# Patient Record
Sex: Male | Born: 1965 | Race: Black or African American | Hispanic: No | Smoking: Never smoker
Health system: Southern US, Community
[De-identification: ages and names within clinical notes are randomized; demographics above are authoritative.]

## PROBLEM LIST (undated history)

## (undated) DIAGNOSIS — S069X9A Unspecified intracranial injury with loss of consciousness of unspecified duration, initial encounter: Secondary | ICD-10-CM

## (undated) DIAGNOSIS — R531 Weakness: Secondary | ICD-10-CM

## (undated) DIAGNOSIS — I10 Essential (primary) hypertension: Secondary | ICD-10-CM

## (undated) DIAGNOSIS — E785 Hyperlipidemia, unspecified: Secondary | ICD-10-CM

## (undated) DIAGNOSIS — E119 Type 2 diabetes mellitus without complications: Secondary | ICD-10-CM

## (undated) DIAGNOSIS — S069XAA Unspecified intracranial injury with loss of consciousness status unknown, initial encounter: Secondary | ICD-10-CM

## (undated) HISTORY — DX: Essential (primary) hypertension: I10

## (undated) HISTORY — PX: TRACHEOSTOMY: SUR1362

## (undated) HISTORY — DX: Hyperlipidemia, unspecified: E78.5

---

## 2011-10-12 ENCOUNTER — Encounter (HOSPITAL_COMMUNITY): Payer: Self-pay

## 2011-10-12 ENCOUNTER — Emergency Department (HOSPITAL_COMMUNITY)
Admission: EM | Admit: 2011-10-12 | Discharge: 2011-10-12 | Disposition: A | Discharge status: Home / Self Care | Payer: Self-pay | Attending: Emergency Medicine | Admitting: Emergency Medicine

## 2011-10-12 DIAGNOSIS — M549 Dorsalgia, unspecified: Secondary | ICD-10-CM | POA: Insufficient documentation

## 2011-10-12 MED ORDER — MELOXICAM 7.5 MG PO TABS: 7.5000 mg | ORAL_TABLET | Freq: Two times a day (BID) | ORAL | Status: DC

## 2011-10-12 MED ORDER — CYCLOBENZAPRINE HCL 10 MG PO TABS: 10.0000 mg | ORAL_TABLET | Freq: Three times a day (TID) | ORAL | Status: AC

## 2011-10-12 NOTE — ED Notes (Signed)
Alert, sitting up in chair.  Back pain intermittently for 1 month and "numbness of left side of my body".
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
## 2011-10-12 NOTE — Discharge Instructions (Signed)
Back Pain, Adult Low back pain is very common. About 1 in 5 people have back pain.The cause of low back pain is rarely dangerous. The pain often gets better over time.About half of people with a sudden onset of back pain feel better in just 2 weeks. About 8 in 10 people feel better by 6 weeks.  CAUSES Some common causes of back pain include:  Strain of the muscles or ligaments supporting the spine.   Wear and tear (degeneration) of the spinal discs.   Arthritis.   Direct injury to the back.  DIAGNOSIS Most of the time, the direct cause of low back pain is not known.However, back pain can be treated effectively even when the exact cause of the pain is unknown.Answering your caregiver's questions about your overall health and symptoms is one of the most accurate ways to make sure the cause of your pain is not dangerous. If your caregiver needs more information, he or she may order lab work or imaging tests (X-rays or MRIs).However, even if imaging tests show changes in your back, this usually does not require surgery. HOME CARE INSTRUCTIONS For many people, back pain returns.Since low back pain is rarely dangerous, it is often a condition that people can learn to manageon their own.   Remain active. It is stressful on the back to sit or stand in one place. Do not sit, drive, or stand in one place for more than 30 minutes at a time. Take short walks on level surfaces as soon as pain allows.Try to increase the length of time you walk each day.   Do not stay in bed.Resting more than 1 or 2 days can delay your recovery.   Do not avoid exercise or work.Your body is made to move.It is not dangerous to be active, even though your back may hurt.Your back will likely heal faster if you return to being active before your pain is gone.   Pay attention to your body when you bend and lift. Many people have less discomfortwhen lifting if they bend their knees, keep the load close to their  bodies,and avoid twisting. Often, the most comfortable positions are those that put less stress on your recovering back.   Find a comfortable position to sleep. Use a firm mattress and lie on your side with your knees slightly bent. If you lie on your back, put a pillow under your knees.   Only take over-the-counter or prescription medicines as directed by your caregiver. Over-the-counter medicines to reduce pain and inflammation are often the most helpful.Your caregiver may prescribe muscle relaxant drugs.These medicines help dull your pain so you can more quickly return to your normal activities and healthy exercise.   Put ice on the injured area.   Put ice in a plastic bag.   Place a towel between your skin and the bag.   Leave the ice on for 15 to 20 minutes, 3 to 4 times a day for the first 2 to 3 days. After that, ice and heat may be alternated to reduce pain and spasms.   Ask your caregiver about trying back exercises and gentle massage. This may be of some benefit.   Avoid feeling anxious or stressed.Stress increases muscle tension and can worsen back pain.It is important to recognize when you are anxious or stressed and learn ways to manage it.Exercise is a great option.  SEEK MEDICAL CARE IF:  You have pain that is not relieved with rest or medicine.   You have   pain that does not improve in 1 week.   You have new symptoms.   You are generally not feeling well.  SEEK IMMEDIATE MEDICAL CARE IF:   You have pain that radiates from your back into your legs.   You develop new bowel or bladder control problems.   You have unusual weakness or numbness in your arms or legs.   You develop nausea or vomiting.   You develop abdominal pain.   You feel faint.  Document Released: 07/31/2005 Document Revised: 04/12/2011 Document Reviewed: 12/19/2010 [employer] Patient Information 2012 [employer]. 
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
## 2011-10-13 NOTE — ED Provider Notes (Signed)
Medical screening examination/treatment/procedure(s) were performed by non-physician practitioner and as supervising physician I was immediately available for consultation/collaboration.  Nicoletta Dress. Colon Branch, MD 10/13/11 1704

## 2012-09-29 ENCOUNTER — Emergency Department (HOSPITAL_COMMUNITY)
Admission: EM | Admit: 2012-09-29 | Discharge: 2012-09-29 | Disposition: A | Discharge status: Home / Self Care | Payer: Self-pay | Attending: Emergency Medicine | Admitting: Emergency Medicine

## 2012-09-29 ENCOUNTER — Encounter (HOSPITAL_COMMUNITY): Payer: Self-pay | Admitting: *Deleted

## 2012-09-29 DIAGNOSIS — R350 Frequency of micturition: Secondary | ICD-10-CM | POA: Insufficient documentation

## 2012-09-29 DIAGNOSIS — R109 Unspecified abdominal pain: Secondary | ICD-10-CM | POA: Insufficient documentation

## 2012-09-29 DIAGNOSIS — R32 Unspecified urinary incontinence: Secondary | ICD-10-CM | POA: Insufficient documentation

## 2012-09-29 DIAGNOSIS — E119 Type 2 diabetes mellitus without complications: Secondary | ICD-10-CM | POA: Insufficient documentation

## 2012-09-29 DIAGNOSIS — Z87828 Personal history of other (healed) physical injury and trauma: Secondary | ICD-10-CM | POA: Insufficient documentation

## 2012-09-29 DIAGNOSIS — IMO0001 Reserved for inherently not codable concepts without codable children: Secondary | ICD-10-CM | POA: Insufficient documentation

## 2012-09-29 DIAGNOSIS — M7918 Myalgia, other site: Secondary | ICD-10-CM

## 2012-09-29 LAB — URINALYSIS, ROUTINE W REFLEX MICROSCOPIC
Bilirubin Urine: NEGATIVE
Glucose, UA: >1000 mg/dL — AB
Hgb urine dipstick: NEGATIVE
Protein, ur: NEGATIVE mg/dL

## 2012-09-29 MED ORDER — CYCLOBENZAPRINE HCL 10 MG PO TABS: 10.0000 mg | ORAL_TABLET | Freq: Three times a day (TID) | ORAL | Status: DC | PRN

## 2012-09-29 MED ORDER — CYCLOBENZAPRINE HCL 10 MG PO TABS
10.0000 mg | ORAL_TABLET | Freq: Once | ORAL | Status: AC
  Administered 2012-09-29: 10 mg via ORAL
  Filled 2012-09-29: qty 1

## 2012-09-29 MED ORDER — METFORMIN HCL 500 MG PO TABS: 500.0000 mg | ORAL_TABLET | Freq: Two times a day (BID) | ORAL | Status: DC

## 2012-09-29 MED ORDER — IBUPROFEN 800 MG PO TABS
800.0000 mg | ORAL_TABLET | Freq: Once | ORAL | Status: AC
  Administered 2012-09-29: 800 mg via ORAL
  Filled 2012-09-29: qty 1

## 2012-09-29 NOTE — ED Notes (Signed)
Pt states right flank pain began 4 days ago. Denies hematuria. States urinary incontinence at times.

## 2012-09-29 NOTE — ED Notes (Signed)
Patient's BGL 327.  Patient stated he had not eaten or drank anything this morning.  Doctor and Nurse were notified.
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
## 2012-10-01 LAB — POCT I-STAT, CHEM 8
BUN: 8 mg/dL (ref 6–23)
Creatinine, Ser: 1.1 mg/dL (ref 0.50–1.35)
Potassium: 4.1 mEq/L (ref 3.5–5.1)
Sodium: 138 mEq/L (ref 135–145)

## 2013-07-13 ENCOUNTER — Encounter (HOSPITAL_COMMUNITY): Payer: Self-pay | Admitting: Emergency Medicine

## 2013-07-13 ENCOUNTER — Emergency Department (HOSPITAL_COMMUNITY)
Admission: EM | Admit: 2013-07-13 | Discharge: 2013-07-13 | Disposition: A | Discharge status: Home / Self Care | Payer: Self-pay | Attending: Emergency Medicine | Admitting: Emergency Medicine

## 2013-07-13 ENCOUNTER — Emergency Department (HOSPITAL_COMMUNITY): Payer: Self-pay

## 2013-07-13 DIAGNOSIS — M549 Dorsalgia, unspecified: Secondary | ICD-10-CM | POA: Insufficient documentation

## 2013-07-13 DIAGNOSIS — J329 Chronic sinusitis, unspecified: Secondary | ICD-10-CM | POA: Insufficient documentation

## 2013-07-13 DIAGNOSIS — Z87820 Personal history of traumatic brain injury: Secondary | ICD-10-CM | POA: Insufficient documentation

## 2013-07-13 DIAGNOSIS — H53149 Visual discomfort, unspecified: Secondary | ICD-10-CM | POA: Insufficient documentation

## 2013-07-13 DIAGNOSIS — R51 Headache: Secondary | ICD-10-CM | POA: Insufficient documentation

## 2013-07-13 DIAGNOSIS — Z79899 Other long term (current) drug therapy: Secondary | ICD-10-CM | POA: Insufficient documentation

## 2013-07-13 DIAGNOSIS — G8929 Other chronic pain: Secondary | ICD-10-CM | POA: Insufficient documentation

## 2013-07-13 DIAGNOSIS — H538 Other visual disturbances: Secondary | ICD-10-CM | POA: Insufficient documentation

## 2013-07-13 HISTORY — DX: Unspecified intracranial injury with loss of consciousness status unknown, initial encounter: S06.9XAA

## 2013-07-13 HISTORY — DX: Unspecified intracranial injury with loss of consciousness of unspecified duration, initial encounter: S06.9X9A

## 2013-07-13 HISTORY — DX: Weakness: R53.1

## 2013-07-13 LAB — CBC WITH DIFFERENTIAL/PLATELET
Eosinophils Absolute: 0.1 10*3/uL (ref 0.0–0.7)
Eosinophils Relative: 1 % (ref 0–5)
Hemoglobin: 14.6 g/dL (ref 13.0–17.0)
Lymphocytes Relative: 32 % (ref 12–46)
Lymphs Abs: 2.4 10*3/uL (ref 0.7–4.0)
MCH: 27.4 pg (ref 26.0–34.0)
MCV: 78.6 fL (ref 78.0–100.0)
Monocytes Relative: 6 % (ref 3–12)
Neutrophils Relative %: 60 % (ref 43–77)
RBC: 5.33 MIL/uL (ref 4.22–5.81)
WBC: 7.4 10*3/uL (ref 4.0–10.5)

## 2013-07-13 LAB — COMPREHENSIVE METABOLIC PANEL
Alkaline Phosphatase: 107 U/L (ref 39–117)
BUN: 9 mg/dL (ref 6–23)
CO2: 21 mEq/L (ref 19–32)
GFR calc Af Amer: >90 mL/min (ref >90)
GFR calc non Af Amer: >90 mL/min (ref >90)
Glucose, Bld: 354 mg/dL — ABNORMAL HIGH (ref 70–99)
Potassium: 3.8 mEq/L (ref 3.5–5.1)
Total Bilirubin: 0.2 mg/dL — ABNORMAL LOW (ref 0.3–1.2)
Total Protein: 7.5 g/dL (ref 6.0–8.3)

## 2013-07-13 LAB — TROPONIN I: Troponin I: <0.3 ng/mL (ref <0.30)

## 2013-07-13 MED ORDER — HYDROCODONE-ACETAMINOPHEN 5-325 MG PO TABS: 2.0000 | ORAL_TABLET | ORAL | Status: DC | PRN

## 2013-07-13 MED ORDER — HYDROCODONE-ACETAMINOPHEN 5-325 MG PO TABS
2.0000 | ORAL_TABLET | Freq: Once | ORAL | Status: AC
  Administered 2013-07-13: 2 via ORAL
  Filled 2013-07-13: qty 2

## 2013-07-13 MED ORDER — AZITHROMYCIN 250 MG PO TABS
500.0000 mg | ORAL_TABLET | Freq: Once | ORAL | Status: AC
  Administered 2013-07-13: 500 mg via ORAL
  Filled 2013-07-13: qty 2

## 2013-07-13 MED ORDER — AZITHROMYCIN 250 MG PO TABS: 250.0000 mg | ORAL_TABLET | Freq: Every day | ORAL | Status: DC

## 2013-07-13 NOTE — ED Notes (Signed)
Pt c/o some left sided chest pain.  Dr. Judd Lien notified.  Order received.
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
## 2013-09-07 ENCOUNTER — Other Ambulatory Visit: Payer: Self-pay

## 2013-09-07 ENCOUNTER — Encounter (HOSPITAL_COMMUNITY): Payer: Self-pay | Admitting: Emergency Medicine

## 2013-09-07 ENCOUNTER — Emergency Department (HOSPITAL_COMMUNITY): Payer: Self-pay

## 2013-09-07 ENCOUNTER — Emergency Department (HOSPITAL_COMMUNITY)
Admission: EM | Admit: 2013-09-07 | Discharge: 2013-09-07 | Disposition: A | Discharge status: Home / Self Care | Payer: Self-pay | Attending: Emergency Medicine | Admitting: Emergency Medicine

## 2013-09-07 DIAGNOSIS — Z8739 Personal history of other diseases of the musculoskeletal system and connective tissue: Secondary | ICD-10-CM | POA: Insufficient documentation

## 2013-09-07 DIAGNOSIS — R079 Chest pain, unspecified: Secondary | ICD-10-CM | POA: Insufficient documentation

## 2013-09-07 DIAGNOSIS — Z87820 Personal history of traumatic brain injury: Secondary | ICD-10-CM | POA: Insufficient documentation

## 2013-09-07 DIAGNOSIS — E119 Type 2 diabetes mellitus without complications: Secondary | ICD-10-CM | POA: Insufficient documentation

## 2013-09-07 DIAGNOSIS — R51 Headache: Secondary | ICD-10-CM | POA: Insufficient documentation

## 2013-09-07 HISTORY — DX: Type 2 diabetes mellitus without complications: E11.9

## 2013-09-07 LAB — CBC WITH DIFFERENTIAL/PLATELET
Basophils Absolute: 0 10*3/uL (ref 0.0–0.1)
Basophils Relative: 0 % (ref 0–1)
EOS PCT: 1 % (ref 0–5)
Eosinophils Absolute: 0.1 10*3/uL (ref 0.0–0.7)
HEMATOCRIT: 43.1 % (ref 39.0–52.0)
Hemoglobin: 14.3 g/dL (ref 13.0–17.0)
LYMPHS ABS: 2.4 10*3/uL (ref 0.7–4.0)
LYMPHS PCT: 31 % (ref 12–46)
MCH: 26.7 pg (ref 26.0–34.0)
MCHC: 33.2 g/dL (ref 30.0–36.0)
MCV: 80.6 fL (ref 78.0–100.0)
MONO ABS: 0.7 10*3/uL (ref 0.1–1.0)
MONOS PCT: 9 % (ref 3–12)
Neutro Abs: 4.5 10*3/uL (ref 1.7–7.7)
Neutrophils Relative %: 59 % (ref 43–77)
Platelets: 280 10*3/uL (ref 150–400)
RBC: 5.35 MIL/uL (ref 4.22–5.81)
RDW: 14.1 % (ref 11.5–15.5)
WBC: 7.7 10*3/uL (ref 4.0–10.5)

## 2013-09-07 LAB — COMPREHENSIVE METABOLIC PANEL
ALK PHOS: 81 U/L (ref 39–117)
ALT: 20 U/L (ref 0–53)
AST: 12 U/L (ref 0–37)
Albumin: 3.7 g/dL (ref 3.5–5.2)
BILIRUBIN TOTAL: 0.3 mg/dL (ref 0.3–1.2)
BUN: 8 mg/dL (ref 6–23)
CALCIUM: 9.5 mg/dL (ref 8.4–10.5)
CO2: 25 meq/L (ref 19–32)
CREATININE: 0.76 mg/dL (ref 0.50–1.35)
Chloride: 101 mEq/L (ref 96–112)
GFR calc Af Amer: >90 mL/min (ref >90)
GFR calc non Af Amer: >90 mL/min (ref >90)
GLUCOSE: 128 mg/dL — AB (ref 70–99)
Potassium: 3.6 mEq/L — ABNORMAL LOW (ref 3.7–5.3)
Sodium: 139 mEq/L (ref 137–147)
Total Protein: 7.9 g/dL (ref 6.0–8.3)

## 2013-09-07 LAB — TROPONIN I
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
## 2013-09-07 NOTE — ED Notes (Signed)
Performed EKG in Triage; pulled old EKG from MUSE and handed both to Dr Estell HarpinZammit.

## 2013-09-07 NOTE — ED Notes (Signed)
Pt c/o left lower rib/upper abd pain that started a hour ago, denies any n/v/d, sob, diaphoresis, radiation,  admits to headache that started with the pain,

## 2013-09-07 NOTE — Discharge Instructions (Signed)

## 2014-06-22 LAB — HM DIABETES FOOT EXAM

## 2015-05-12 ENCOUNTER — Other Ambulatory Visit: Payer: Self-pay | Admitting: Physician Assistant

## 2015-05-12 LAB — HEMOGLOBIN A1C
Hgb A1c MFr Bld: 12.7 % — ABNORMAL HIGH (ref <5.7)
Mean Plasma Glucose: 318 mg/dL — ABNORMAL HIGH (ref <117)

## 2015-05-12 LAB — COMPREHENSIVE METABOLIC PANEL
ALBUMIN: 3.9 g/dL (ref 3.6–5.1)
ALK PHOS: 92 U/L (ref 40–115)
ALT: 19 U/L (ref 9–46)
AST: 13 U/L (ref 10–40)
BUN: 7 mg/dL (ref 7–25)
CALCIUM: 9.4 mg/dL (ref 8.6–10.3)
CO2: 27 mmol/L (ref 20–31)
Chloride: 98 mmol/L (ref 98–110)
Creat: 0.87 mg/dL (ref 0.60–1.35)
Glucose, Bld: 311 mg/dL — ABNORMAL HIGH (ref 65–99)
Potassium: 4.4 mmol/L (ref 3.5–5.3)
Sodium: 134 mmol/L — ABNORMAL LOW (ref 135–146)
Total Bilirubin: 0.5 mg/dL (ref 0.2–1.2)
Total Protein: 7 g/dL (ref 6.1–8.1)

## 2015-05-12 LAB — LIPID PANEL
Cholesterol: 237 mg/dL — ABNORMAL HIGH (ref 125–200)
HDL: 26 mg/dL — AB (ref >=40)
Total CHOL/HDL Ratio: 9.1 Ratio — ABNORMAL HIGH (ref <=5.0)
Triglycerides: 620 mg/dL — ABNORMAL HIGH (ref <150)

## 2015-05-19 ENCOUNTER — Ambulatory Visit: Payer: Self-pay | Admitting: Physician Assistant

## 2015-05-19 ENCOUNTER — Encounter: Payer: Self-pay | Admitting: Physician Assistant

## 2015-05-19 VITALS — BP 140/80 | HR 88 | Temp 97.7°F | Wt 182.2 lb

## 2015-05-19 DIAGNOSIS — E1165 Type 2 diabetes mellitus with hyperglycemia: Secondary | ICD-10-CM

## 2015-05-19 DIAGNOSIS — Z91199 Patient's noncompliance with other medical treatment and regimen due to unspecified reason: Secondary | ICD-10-CM

## 2015-05-19 DIAGNOSIS — Z9119 Patient's noncompliance with other medical treatment and regimen: Secondary | ICD-10-CM

## 2015-05-19 DIAGNOSIS — I10 Essential (primary) hypertension: Secondary | ICD-10-CM

## 2015-05-19 DIAGNOSIS — E669 Obesity, unspecified: Secondary | ICD-10-CM

## 2015-05-19 DIAGNOSIS — E785 Hyperlipidemia, unspecified: Secondary | ICD-10-CM

## 2015-05-19 DIAGNOSIS — E118 Type 2 diabetes mellitus with unspecified complications: Principal | ICD-10-CM

## 2015-05-19 NOTE — Patient Instructions (Signed)
Take all meds as directed Bring bs log to follow up appointment

## 2015-06-29 ENCOUNTER — Ambulatory Visit: Payer: Self-pay | Admitting: Physician Assistant

## 2015-07-05 ENCOUNTER — Other Ambulatory Visit: Payer: Self-pay | Admitting: Physician Assistant

## 2015-07-12 ENCOUNTER — Ambulatory Visit: Payer: Self-pay | Admitting: Physician Assistant

## 2015-08-10 ENCOUNTER — Telehealth: Payer: Self-pay | Admitting: Student

## 2015-08-10 ENCOUNTER — Other Ambulatory Visit: Payer: Self-pay | Admitting: Physician Assistant

## 2015-08-10 DIAGNOSIS — E1165 Type 2 diabetes mellitus with hyperglycemia: Secondary | ICD-10-CM

## 2015-08-10 DIAGNOSIS — E118 Type 2 diabetes mellitus with unspecified complications: Principal | ICD-10-CM

## 2015-08-10 DIAGNOSIS — Z9119 Patient's noncompliance with other medical treatment and regimen: Secondary | ICD-10-CM | POA: Insufficient documentation

## 2015-08-10 DIAGNOSIS — I10 Essential (primary) hypertension: Secondary | ICD-10-CM

## 2015-08-10 DIAGNOSIS — E785 Hyperlipidemia, unspecified: Secondary | ICD-10-CM

## 2015-08-10 DIAGNOSIS — Z91199 Patient's noncompliance with other medical treatment and regimen due to unspecified reason: Secondary | ICD-10-CM | POA: Insufficient documentation

## 2015-08-10 DIAGNOSIS — E669 Obesity, unspecified: Secondary | ICD-10-CM | POA: Insufficient documentation

## 2015-08-10 DIAGNOSIS — IMO0002 Reserved for concepts with insufficient information to code with codable children: Secondary | ICD-10-CM | POA: Insufficient documentation

## 2015-08-10 NOTE — Telephone Encounter (Signed)
Called and rescheduled pt's appt for Monday Aug 30, 2015 at 8:15. Pt was informed that he needs to get labs drawn prior to his appt. Pt understood.

## 2015-08-10 NOTE — Telephone Encounter (Signed)
-----   Message from Jacquelin HawkingShannon McElroy, [place] sent at 08/10/2015  9:04 AM EST ----- Please call pt- he needs to reschedule the appointment that he no-showed for.  His diabetes was very much uncontrolled at his last OV so he really needs to come in.  He should get labs prior to appt.

## 2015-08-10 NOTE — Progress Notes (Signed)
BP 140/80 mmHg  Pulse 88  Temp(Src) 97.7 F (36.5 C)  Wt 182 lb 3.2 oz (82.645 kg)  SpO2 97%   Subjective:    Patient ID: Ricky [name], male    DOB: [birth year], 49 y.o.   MRN: [id number]  HPI: Ricky [name] is a 49 y.o. male presenting on 05/19/2015 for Diabetes   HPI  Relevant past medical, surgical, family and social history reviewed and updated as indicated. Interim medical history since our last visit reviewed. Allergies and medications reviewed and updated.  Review of Systems  Per HPI unless specifically indicated above     Objective:    BP 140/80 mmHg  Pulse 88  Temp(Src) 97.7 F (36.5 C)  Wt 182 lb 3.2 oz (82.645 kg)  SpO2 97%  Wt Readings from Last 3 Encounters:  05/19/15 182 lb 3.2 oz (82.645 kg)  04/01/15 188 lb 12.8 oz (85.639 kg)  09/07/13 185 lb (83.915 kg)    Physical Exam  Constitutional: He is oriented to person, place, and time. He appears well-developed and well-nourished.  HENT:  Head: Normocephalic and atraumatic.  Neck: Neck supple.  Cardiovascular: Normal rate and regular rhythm.   Pulmonary/Chest: Effort normal and breath sounds normal. He has no wheezes.  Abdominal: Soft. Bowel sounds are normal. There is no tenderness.  Musculoskeletal: He exhibits no edema.  Lymphadenopathy:    He has no cervical adenopathy.  Neurological: He is alert and oriented to person, place, and time.  Skin: Skin is warm and dry.  Psychiatric: He has a normal mood and affect. His behavior is normal.  Vitals reviewed.     Results for orders placed or performed in visit on 05/12/15  Comprehensive metabolic panel  Result Value Ref Range   Sodium 134 (L) 135 - 146 mmol/L   Potassium 4.4 3.5 - 5.3 mmol/L   Chloride 98 98 - 110 mmol/L   CO2 27 20 - 31 mmol/L   Glucose, Bld 311 (H) 65 - 99 mg/dL   BUN 7 7 - 25 mg/dL   Creat 4.09 8.11 - 9.14 mg/dL   Total Bilirubin 0.5 0.2 - 1.2 mg/dL   Alkaline Phosphatase 92 40 - 115 U/L   AST 13 10 - 40 U/L   ALT 19 9 - 46 U/L   Total Protein 7.0 6.1 - 8.1 g/dL   Albumin 3.9 3.6 - 5.1 g/dL   Calcium 9.4 8.6 - 78.2 mg/dL  Lipid panel  Result Value Ref Range   Cholesterol 237 (H) 125 - 200 mg/dL   Triglycerides 956 (H) <150 mg/dL   HDL 26 (L) >=21 mg/dL   Total CHOL/HDL Ratio 9.1 (H) <=5.0 Ratio   VLDL NOT CALC <30 mg/dL   LDL Cholesterol NOT CALC <130 mg/dL  Hemoglobin H0Q  Result Value Ref Range   Hgb A1c MFr Bld 12.7 (H) <5.7 %   Mean Plasma Glucose 318 (H) <117 mg/dL      Assessment & Plan:   Encounter Diagnoses  Name Primary?  [name] Uncontrolled type 2 diabetes mellitus with complication, unspecified long term insulin use status (HCC) Yes  . Personal history of noncompliance with medical treatment, presenting hazards to health   . Essential hypertension, benign   . Hyperlipidemia   . Obesity, unspecified      Reviewed labs and his need for compliance with his medications.  Discussed long term consequences of his continuing to not take care of his diabetes and blood pressure.   Follow up plan: Return in about 5  weeks (around 06/23/2015) for dm.

## 2015-08-25 ENCOUNTER — Other Ambulatory Visit: Payer: Self-pay | Admitting: Physician Assistant

## 2015-08-26 LAB — COMPLETE METABOLIC PANEL WITH GFR
ALT: 16 U/L (ref 9–46)
AST: 10 U/L (ref 10–40)
Albumin: 4 g/dL (ref 3.6–5.1)
Alkaline Phosphatase: 83 U/L (ref 40–115)
BUN: 8 mg/dL (ref 7–25)
CHLORIDE: 98 mmol/L (ref 98–110)
CO2: 26 mmol/L (ref 20–31)
Calcium: 9.3 mg/dL (ref 8.6–10.3)
Creat: 0.94 mg/dL (ref 0.60–1.35)
GFR, Est African American: >89 mL/min (ref >=60)
GFR, Est Non African American: >89 mL/min (ref >=60)
Glucose, Bld: 342 mg/dL — ABNORMAL HIGH (ref 65–99)
Potassium: 4.7 mmol/L (ref 3.5–5.3)
SODIUM: 136 mmol/L (ref 135–146)
Total Bilirubin: 0.4 mg/dL (ref 0.2–1.2)
Total Protein: 7.1 g/dL (ref 6.1–8.1)

## 2015-08-26 LAB — LIPID PANEL
Cholesterol: 235 mg/dL — ABNORMAL HIGH (ref 125–200)
HDL: 32 mg/dL — ABNORMAL LOW (ref >=40)
TRIGLYCERIDES: 452 mg/dL — AB (ref <150)
Total CHOL/HDL Ratio: 7.3 Ratio — ABNORMAL HIGH (ref <=5.0)

## 2015-08-26 LAB — HEMOGLOBIN A1C
Hgb A1c MFr Bld: 13.1 % — ABNORMAL HIGH (ref <5.7)
Mean Plasma Glucose: 329 mg/dL — ABNORMAL HIGH (ref <117)

## 2015-08-26 LAB — MICROALBUMIN, URINE: Microalb, Ur: 7.7 mg/dL

## 2015-08-30 ENCOUNTER — Ambulatory Visit: Payer: Self-pay | Admitting: Physician Assistant

## 2015-08-30 ENCOUNTER — Encounter: Payer: Self-pay | Admitting: Physician Assistant

## 2015-08-30 VITALS — BP 146/88 | HR 87 | Temp 97.9°F | Ht 65.0 in | Wt 178.8 lb

## 2015-08-30 DIAGNOSIS — E785 Hyperlipidemia, unspecified: Secondary | ICD-10-CM

## 2015-08-30 DIAGNOSIS — Z91199 Patient's noncompliance with other medical treatment and regimen due to unspecified reason: Secondary | ICD-10-CM

## 2015-08-30 DIAGNOSIS — E118 Type 2 diabetes mellitus with unspecified complications: Principal | ICD-10-CM

## 2015-08-30 DIAGNOSIS — Z9119 Patient's noncompliance with other medical treatment and regimen: Secondary | ICD-10-CM

## 2015-08-30 DIAGNOSIS — E1165 Type 2 diabetes mellitus with hyperglycemia: Secondary | ICD-10-CM

## 2015-08-30 DIAGNOSIS — I10 Essential (primary) hypertension: Secondary | ICD-10-CM

## 2015-08-30 MED ORDER — SITAGLIPTIN PHOS-METFORMIN HCL 50-1000 MG PO TABS: 1.0000 | ORAL_TABLET | Freq: Two times a day (BID) | ORAL | Status: DC

## 2015-08-30 MED ORDER — ATORVASTATIN CALCIUM 20 MG PO TABS
20.0000 mg | ORAL_TABLET | Freq: Every day | ORAL | Status: DC
Start: 2015-08-30 — End: 2016-04-04

## 2015-08-30 MED ORDER — LISINOPRIL 20 MG PO TABS: 20.0000 mg | ORAL_TABLET | Freq: Every day | ORAL | Status: DC

## 2015-08-30 NOTE — Progress Notes (Signed)
BP 146/88 mmHg  Pulse 87  Temp(Src) 97.9 F (36.6 C)  Ht 5\' 5"  (1.651 m)  Wt 178 lb 12.8 oz (81.103 kg)  BMI 29.75 kg/m2  SpO2 98%   Subjective:    Patient ID: Ricky [name], male    DOB: Dec 13, 1965, 50 y.o.   MRN: [id number]  HPI: Ricky SparkJoseph Njoku is a 50 y.o. male presenting on 08/30/2015 for Diabetes   HPI Pt is not taking his glipizide or his simvastatin  Pt was scheduled to f/u Nov 15 but no-showed.  He then cancelled his appt on Nov 28 and is just now returning for follow-up.    He says he is feeling fine.  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   Relevant past medical, surgical, family and social history reviewed and updated as indicated. Interim medical history since our last visit reviewed. Allergies and medications reviewed and updated.   Current outpatient prescriptions:  .  insulin glargine (LANTUS) 100 UNIT/ML injection, Inject 18 Units into the skin at bedtime. , Disp: , Rfl:  .  lisinopril (PRINIVIL,ZESTRIL) 20 MG tablet, TAKE 1 Tablet BY MOUTH ONCE DAILY FOR BLOOD PRESSURE, Disp: 90 tablet, Rfl: 0 .  metFORMIN (GLUCOPHAGE) 1000 MG tablet, TAKE 1 Tablet  BY MOUTH TWICE DAILY FOR DIABETES, Disp: 180 tablet, Rfl: 0 .  Omega-3 Fatty Acids (FISH OIL PO), Take 1 tablet by mouth every 4 (four) hours. , Disp: , Rfl:    Review of Systems  Constitutional: Positive for unexpected weight change. Negative for fever, chills, diaphoresis, appetite change and fatigue.  HENT: Positive for congestion and dental problem. Negative for drooling, ear pain, facial swelling, hearing loss, mouth sores, sneezing, sore throat, trouble swallowing and voice change.   Eyes: Negative for pain, discharge, redness, itching and visual disturbance.  Respiratory: Negative for cough, choking, shortness of breath and wheezing.   Cardiovascular: Negative for chest pain, palpitations and leg swelling.  Gastrointestinal: Positive for constipation. Negative for vomiting,  abdominal pain, diarrhea and blood in stool.  Endocrine: Positive for polydipsia. Negative for cold intolerance and heat intolerance.  Genitourinary: Negative for dysuria, hematuria and decreased urine volume.  Musculoskeletal: Positive for back pain. Negative for arthralgias and gait problem.  Skin: Positive for rash.  Allergic/Immunologic: Negative for environmental allergies.  Neurological: Negative for seizures, syncope, light-headedness and headaches.  Hematological: Negative for adenopathy.  Psychiatric/Behavioral: Positive for dysphoric mood. Negative for suicidal ideas and agitation. The patient is nervous/anxious.     Per HPI unless specifically indicated above     Objective:    BP 146/88 mmHg  Pulse 87  Temp(Src) 97.9 F (36.6 C)  Ht 5\' 5"  (1.651 m)  Wt 178 lb 12.8 oz (81.103 kg)  BMI 29.75 kg/m2  SpO2 98%  Wt Readings from Last 3 Encounters:  08/30/15 178 lb 12.8 oz (81.103 kg)  05/19/15 182 lb 3.2 oz (82.645 kg)  04/01/15 188 lb 12.8 oz (85.639 kg)    Physical Exam  Constitutional: He is oriented to person, place, and time. He appears well-developed and well-nourished.  HENT:  Head: Normocephalic and atraumatic.  Neck: Neck supple.  Cardiovascular: Normal rate and regular rhythm.   Pulmonary/Chest: Effort normal and breath sounds normal. He has no wheezes.  Abdominal: Soft. Bowel sounds are normal. There is no tenderness.  Musculoskeletal: He exhibits no edema.  Lymphadenopathy:    He has no cervical adenopathy.  Neurological: He is alert and oriented to person, place, and time.  Skin: Skin is warm  and dry.  Psychiatric: He has a normal mood and affect. His behavior is normal.  Vitals reviewed.       Assessment & Plan:   Encounter Diagnoses  Name Primary?  [name] Uncontrolled type 2 diabetes mellitus with complication, unspecified long term insulin use status (HCC) Yes  . Essential hypertension, benign   . Hyperlipidemia   . Personal history of  noncompliance with medical treatment, presenting hazards to health     -reviewed labs done January 11  With pt -  All significantly uncontrolled -Do not restart glipizide.  Add [place]. -counseled pt again on the need that he be compliant with his medications and coming in for his office visits in order to get control of his diabetes and htn so as to minimize systemic damage -discussed with pt that he is past due in need for diabetic eye exam.  He will need to schedule and pay for this himself as he no-showed to free appointment that the clinic scheduled for him in the past.  Per clinic policy, we do not re-schedule no-shows for diabetic eye exams. -F/u 1 mo with bs log

## 2015-08-30 NOTE — Patient Instructions (Signed)
BRING ALL MEDICINES TO EVERY APPOINTMENT!

## 2015-09-27 ENCOUNTER — Encounter: Payer: Self-pay | Admitting: Physician Assistant

## 2015-09-27 ENCOUNTER — Ambulatory Visit: Payer: Self-pay | Admitting: Physician Assistant

## 2015-09-27 VITALS — BP 144/86 | HR 89 | Temp 98.1°F | Ht 65.0 in | Wt 183.2 lb

## 2015-09-27 DIAGNOSIS — E118 Type 2 diabetes mellitus with unspecified complications: Secondary | ICD-10-CM

## 2015-09-27 DIAGNOSIS — Z9119 Patient's noncompliance with other medical treatment and regimen: Secondary | ICD-10-CM

## 2015-09-27 DIAGNOSIS — Z91199 Patient's noncompliance with other medical treatment and regimen due to unspecified reason: Secondary | ICD-10-CM

## 2015-09-27 DIAGNOSIS — I10 Essential (primary) hypertension: Secondary | ICD-10-CM

## 2015-09-27 DIAGNOSIS — E1165 Type 2 diabetes mellitus with hyperglycemia: Secondary | ICD-10-CM

## 2015-09-27 MED ORDER — LISINOPRIL 20 MG PO TABS: 20.0000 mg | ORAL_TABLET | Freq: Every day | ORAL | Status: DC

## 2015-09-27 NOTE — Patient Instructions (Signed)
BRING YOUR BLOOD SUGAR LOG TO NEXT APPOINTMENT!

## 2015-09-27 NOTE — Progress Notes (Signed)
BP 144/86 mmHg  Pulse 89  Temp(Src) 98.1 F (36.7 C)  Ht  (1.651 m)  Wt 183 lb 3.2 oz (83.099 kg)  BMI 30.49 kg/m2  SpO2 97%   Subjective:    Patient ID: Ricky [name], male    DOB: Dec 19, 1965, 50 y.o.   MRN: [id number]  HPI: Ricky [name] is a 50 y.o. male presenting on 09/27/2015 for Diabetes   HPI  Chief Complaint  Patient presents with  . Diabetes    pt states he is doing alright    Pt is not taking his lisinopril.  He just stopped it.  Pt says he checks his bs but he lost his log sheet.   Relevant past medical, surgical, family and social history reviewed and updated as indicated. Interim medical history since our last visit reviewed. Allergies and medications reviewed and updated.   Current outpatient prescriptions:  .  atorvastatin (LIPITOR) 20 MG tablet, Take 1 tablet (20 mg total) by mouth daily., Disp: 90 tablet, Rfl: 1 .  insulin glargine (LANTUS) 100 UNIT/ML injection, Inject 18 Units into the skin at bedtime. , Disp: , Rfl:  .  Omega-3 Fatty Acids (FISH OIL PO), Take 1 tablet by mouth 4 (four) times daily. , Disp: , Rfl:  .  sitaGLIPtin-metformin (JANUMET) 50-1000 MG tablet, Take 1 tablet by mouth 2 (two) times daily with a meal., Disp: 180 tablet, Rfl: 1 .  lisinopril (PRINIVIL,ZESTRIL) 20 MG tablet, Take 1 tablet (20 mg total) by mouth daily., Disp: 90 tablet, Rfl: 1   Review of Systems  Constitutional: Negative for fever, chills, diaphoresis, appetite change, fatigue and unexpected weight change.  HENT: Negative for congestion, dental problem, drooling, ear pain, facial swelling, hearing loss, mouth sores, sneezing, sore throat, trouble swallowing and voice change.   Eyes: Negative for pain, discharge, redness, itching and visual disturbance.  Respiratory: Negative for cough, choking, shortness of breath and wheezing.   Cardiovascular: Negative for chest pain, palpitations and leg swelling.  Gastrointestinal: Negative for vomiting,  abdominal pain, diarrhea, constipation and blood in stool.  Endocrine: Negative for cold intolerance, heat intolerance and polydipsia.  Genitourinary: Negative for dysuria, hematuria and decreased urine volume.  Musculoskeletal: Negative for back pain, arthralgias and gait problem.  Skin: Negative for rash.  Allergic/Immunologic: Negative for environmental allergies.  Neurological: Negative for seizures, syncope, light-headedness and headaches.  Hematological: Negative for adenopathy.  Psychiatric/Behavioral: Negative for suicidal ideas, dysphoric mood and agitation. The patient is not nervous/anxious.     Per HPI unless specifically indicated above     Objective:    BP 144/86 mmHg  Pulse 89  Temp(Src) 98.1 F (36.7 C)  Ht  (1.651 m)  Wt 183 lb 3.2 oz (83.099 kg)  BMI 30.49 kg/m2  SpO2 97%  Wt Readings from Last 3 Encounters:  09/27/15 183 lb 3.2 oz (83.099 kg)  08/30/15 178 lb 12.8 oz (81.103 kg)  05/19/15 182 lb 3.2 oz (82.645 kg)    Physical Exam  Constitutional: He is oriented to person, place, and time. He appears well-developed and well-nourished.  HENT:  Head: Normocephalic and atraumatic.  Neck: Neck supple.  Cardiovascular: Normal rate and regular rhythm.   Pulmonary/Chest: Effort normal and breath sounds normal. He has no wheezes.  Abdominal: Soft. Bowel sounds are normal. There is no hepatosplenomegaly. There is no tenderness.  Musculoskeletal: He exhibits no edema.  Lymphadenopathy:    He has no cervical adenopathy.  Neurological: He is alert and oriented to person, place, and time.  Skin: Skin is warm and dry.  Psychiatric: He has a normal mood and affect. His behavior is normal.  Vitals reviewed.       Assessment & Plan:   Encounter Diagnoses  Name Primary?  . Personal history of noncompliance with medical treatment, presenting hazards to health Yes  . Uncontrolled type 2 diabetes mellitus with complication, unspecified long term insulin use  status (HCC)   . Essential hypertension, benign      -discussed again with pt that his continuing non-compliance is harming his health.  He again agrees to do better -he is to resume his lisinopril -he is to monitor his a.m. Fasting bs and bring log to f/u OV in one month -again discussed with pt that we cannot send him to the eye doctor b/c he no-showed to the appointment that we made for him.  He states understanding

## 2015-10-26 ENCOUNTER — Ambulatory Visit: Payer: Self-pay | Admitting: Physician Assistant

## 2015-11-01 ENCOUNTER — Encounter: Payer: Self-pay | Admitting: Physician Assistant

## 2015-11-01 ENCOUNTER — Ambulatory Visit: Payer: Self-pay | Admitting: Physician Assistant

## 2015-11-01 VITALS — BP 130/74 | HR 95 | Temp 98.1°F | Ht 65.0 in | Wt 185.0 lb

## 2015-11-01 DIAGNOSIS — E1165 Type 2 diabetes mellitus with hyperglycemia: Secondary | ICD-10-CM

## 2015-11-01 DIAGNOSIS — Z9119 Patient's noncompliance with other medical treatment and regimen: Secondary | ICD-10-CM

## 2015-11-01 DIAGNOSIS — E785 Hyperlipidemia, unspecified: Secondary | ICD-10-CM

## 2015-11-01 DIAGNOSIS — I10 Essential (primary) hypertension: Secondary | ICD-10-CM

## 2015-11-01 DIAGNOSIS — Z91199 Patient's noncompliance with other medical treatment and regimen due to unspecified reason: Secondary | ICD-10-CM

## 2015-11-01 DIAGNOSIS — E118 Type 2 diabetes mellitus with unspecified complications: Secondary | ICD-10-CM

## 2015-11-01 NOTE — Progress Notes (Signed)
BP 130/74 mmHg  Pulse 95  Temp(Src) 98.1 F (36.7 C)  Ht 5\' 5"  (1.651 m)  Wt 185 lb (83.915 kg)  BMI 30.79 kg/m2  SpO2 97%   Subjective:    Patient ID: Ricky [name], male    DOB: [birth year], 50 y.o.   MRN: [id number]  HPI: Ricky [name] is a 50 y.o. 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  Relevant past medical, surgical, family and social history reviewed and updated as indicated. Interim medical history since our last visit reviewed. Allergies and medications reviewed and updated.   Current outpatient prescriptions:  .  atorvastatin (LIPITOR) 20 MG tablet, Take 1 tablet (20 mg total) by mouth daily., Disp: 90 tablet, Rfl: 1 .  insulin glargine (LANTUS) 100 UNIT/ML injection, Inject 18 Units into the skin at bedtime. , Disp: , Rfl:  .  lisinopril (PRINIVIL,ZESTRIL) 20 MG tablet, Take 1 tablet (20 mg total) by mouth daily., Disp: 90 tablet, Rfl: 0 .  Omega-3 Fatty Acids (FISH OIL PO), Take 1 tablet by mouth 4 (four) times daily. , Disp: , Rfl:  .  sitaGLIPtin-metformin (JANUMET) 50-1000 MG tablet, Take 1 tablet by mouth 2 (two) times daily with a meal., Disp: 180 tablet, Rfl: 1   Review of Systems  Constitutional: Negative for fever, chills, diaphoresis, appetite change, fatigue and unexpected weight change.  HENT: Positive for dental problem. Negative for congestion, drooling, ear pain, facial swelling, hearing loss, mouth sores, sneezing, sore throat, trouble swallowing and voice change.   Eyes: Negative for pain, discharge, redness, itching and visual disturbance.  Respiratory: Negative for cough, choking, shortness of breath and wheezing.   Cardiovascular: Negative for chest pain, palpitations and leg swelling.  Gastrointestinal: Negative for vomiting, abdominal pain, diarrhea, constipation  and blood in stool.  Endocrine: Positive for polydipsia. Negative for cold intolerance and heat intolerance.  Genitourinary: Negative for dysuria, hematuria and decreased urine volume.  Musculoskeletal: Negative for back pain, arthralgias and gait problem.  Skin: Negative for rash.  Allergic/Immunologic: Negative for environmental allergies.  Neurological: Negative for seizures, syncope, light-headedness and headaches.  Hematological: Negative for adenopathy.  Psychiatric/Behavioral: Negative for suicidal ideas, dysphoric mood and agitation. The patient is not nervous/anxious.     Per HPI unless specifically indicated above     Objective:    BP 130/74 mmHg  Pulse 95  Temp(Src) 98.1 F (36.7 C)  Ht 5\' 5"  (1.651 m)  Wt 185 lb (83.915 kg)  BMI 30.79 kg/m2  SpO2 97%  Wt Readings from Last 3 Encounters:  11/01/15 185 lb (83.915 kg)  09/27/15 183 lb 3.2 oz (83.099 kg)  08/30/15 178 lb 12.8 oz (81.103 kg)    Physical Exam  Constitutional: He is oriented to person, place, and time. He appears well-developed and well-nourished.  HENT:  Head: Normocephalic and atraumatic.  Neck: Neck supple.  Cardiovascular: Normal rate and regular rhythm.   Pulmonary/Chest: Effort normal and breath sounds normal. He has no wheezes.  Lymphadenopathy:    He has no cervical adenopathy.  Neurological: He is alert and oriented to person, place, and time.  Skin: Skin is warm and dry.  Psychiatric: He has a normal mood and affect. His behavior is normal.  Vitals reviewed.       Assessment & Plan:   Encounter Diagnoses  Name Primary?  . Personal history of noncompliance with medical treatment, presenting hazards to health Yes  . Uncontrolled type 2 diabetes mellitus with complication, unspecified long term insulin use status (HCC)   . Essential hypertension, benign    -bp improved now that pt is back taking his lisinopril -Will type letter for pt- he can pick it up tomorrow -counseled pt AGAIN  about his lack of compliance which makes it really difficult if not impossible to improve control of his diabetes -F/u 1 mo with bs log.

## 2015-11-22 ENCOUNTER — Other Ambulatory Visit: Payer: Self-pay

## 2015-11-22 DIAGNOSIS — E785 Hyperlipidemia, unspecified: Secondary | ICD-10-CM

## 2015-11-22 DIAGNOSIS — I10 Essential (primary) hypertension: Secondary | ICD-10-CM

## 2015-11-22 DIAGNOSIS — E1165 Type 2 diabetes mellitus with hyperglycemia: Secondary | ICD-10-CM

## 2015-11-22 DIAGNOSIS — E118 Type 2 diabetes mellitus with unspecified complications: Principal | ICD-10-CM

## 2015-11-23 LAB — LIPID PANEL
CHOL/HDL RATIO: 6.1 ratio — AB (ref <=5.0)
Cholesterol: 201 mg/dL — ABNORMAL HIGH (ref 125–200)
HDL: 33 mg/dL — ABNORMAL LOW (ref >=40)
Triglycerides: 413 mg/dL — ABNORMAL HIGH (ref <150)

## 2015-11-23 LAB — COMPLETE METABOLIC PANEL WITH GFR
ALK PHOS: 71 U/L (ref 40–115)
ALT: 17 U/L (ref 9–46)
AST: 14 U/L (ref 10–35)
Albumin: 3.7 g/dL (ref 3.6–5.1)
BILIRUBIN TOTAL: 0.3 mg/dL (ref 0.2–1.2)
BUN: 8 mg/dL (ref 7–25)
CO2: 24 mmol/L (ref 20–31)
Calcium: 9.1 mg/dL (ref 8.6–10.3)
Chloride: 101 mmol/L (ref 98–110)
Creat: 0.89 mg/dL (ref 0.70–1.33)
GFR, Est African American: >89 mL/min (ref >=60)
GFR, Est Non African American: >89 mL/min (ref >=60)
GLUCOSE: 286 mg/dL — AB (ref 65–99)
Potassium: 4.3 mmol/L (ref 3.5–5.3)
SODIUM: 137 mmol/L (ref 135–146)
TOTAL PROTEIN: 7.1 g/dL (ref 6.1–8.1)

## 2015-11-23 LAB — HEMOGLOBIN A1C
Hgb A1c MFr Bld: 11.8 % — ABNORMAL HIGH (ref <5.7)
Mean Plasma Glucose: 292 mg/dL

## 2015-12-02 ENCOUNTER — Encounter: Payer: Self-pay | Admitting: Physician Assistant

## 2015-12-02 ENCOUNTER — Ambulatory Visit: Payer: Self-pay | Admitting: Physician Assistant

## 2015-12-02 VITALS — BP 136/78 | HR 101 | Temp 97.9°F | Ht 65.0 in | Wt 186.2 lb

## 2015-12-02 DIAGNOSIS — Z1211 Encounter for screening for malignant neoplasm of colon: Secondary | ICD-10-CM

## 2015-12-02 DIAGNOSIS — Z9119 Patient's noncompliance with other medical treatment and regimen: Secondary | ICD-10-CM

## 2015-12-02 DIAGNOSIS — E1165 Type 2 diabetes mellitus with hyperglycemia: Secondary | ICD-10-CM

## 2015-12-02 DIAGNOSIS — E785 Hyperlipidemia, unspecified: Secondary | ICD-10-CM

## 2015-12-02 DIAGNOSIS — E669 Obesity, unspecified: Secondary | ICD-10-CM

## 2015-12-02 DIAGNOSIS — I10 Essential (primary) hypertension: Secondary | ICD-10-CM

## 2015-12-02 DIAGNOSIS — E118 Type 2 diabetes mellitus with unspecified complications: Principal | ICD-10-CM

## 2015-12-02 DIAGNOSIS — Z91199 Patient's noncompliance with other medical treatment and regimen due to unspecified reason: Secondary | ICD-10-CM

## 2015-12-02 NOTE — Progress Notes (Signed)
BP 136/78 mmHg  Pulse 101  Temp(Src) 97.9 F (36.6 C)  Ht  (1.651 m)  Wt 186 lb 3.2 oz (84.46 kg)  BMI 30.99 kg/m2  SpO2 97%   Subjective:    Patient ID: Ricky [name], male    DOB: August 18, 1965, 50 y.o.   MRN: [id number]  HPI: Ricky [name] is a 50 y.o. male presenting on 12/02/2015 for Diabetes; Hypertension; and Hyperlipidemia   HPI  Pt here today to discuss his diabetes after discussing the importance of better control at this last OV 1 month ago. His bs log has 4 numbers on it   Relevant past medical, surgical, family and social history reviewed and updated as indicated. Interim medical history since our last visit reviewed. Allergies and medications reviewed and updated.  Current outpatient prescriptions:  .  atorvastatin (LIPITOR) 20 MG tablet, Take 1 tablet (20 mg total) by mouth daily., Disp: 90 tablet, Rfl: 1 .  insulin glargine (LANTUS) 100 UNIT/ML injection, Inject 18 Units into the skin at bedtime. , Disp: , Rfl:  .  lisinopril (PRINIVIL,ZESTRIL) 20 MG tablet, Take 1 tablet (20 mg total) by mouth daily., Disp: 90 tablet, Rfl: 0 .  Omega-3 Fatty Acids (FISH OIL PO), Take 1 tablet by mouth 4 (four) times daily. , Disp: , Rfl:  .  sitaGLIPtin-metformin (JANUMET) 50-1000 MG tablet, Take 1 tablet by mouth 2 (two) times daily with a meal., Disp: 180 tablet, Rfl: 1   Review of Systems  Constitutional: Negative for fever, chills, diaphoresis, appetite change, fatigue and unexpected weight change.  HENT: Negative for congestion, dental problem, drooling, ear pain, facial swelling, hearing loss, mouth sores, sneezing, sore throat, trouble swallowing and voice change.   Eyes: Negative for pain, discharge, redness, itching and visual disturbance.  Respiratory: Negative for cough, choking, shortness of breath and wheezing.   Cardiovascular: Negative for chest pain, palpitations and leg swelling.  Gastrointestinal: Negative for vomiting, abdominal pain, diarrhea,  constipation and blood in stool.  Endocrine: Negative for cold intolerance, heat intolerance and polydipsia.  Genitourinary: Negative for dysuria, hematuria and decreased urine volume.  Musculoskeletal: Negative for back pain, arthralgias and gait problem.  Skin: Negative for rash.  Allergic/Immunologic: Negative for environmental allergies.  Neurological: Negative for seizures, syncope, light-headedness and headaches.  Hematological: Negative for adenopathy.  Psychiatric/Behavioral: Negative for suicidal ideas, dysphoric mood and agitation. The patient is not nervous/anxious.     Per HPI unless specifically indicated above     Objective:    BP 136/78 mmHg  Pulse 101  Temp(Src) 97.9 F (36.6 C)  Ht  (1.651 m)  Wt 186 lb 3.2 oz (84.46 kg)  BMI 30.99 kg/m2  SpO2 97%  Wt Readings from Last 3 Encounters:  12/02/15 186 lb 3.2 oz (84.46 kg)  11/01/15 185 lb (83.915 kg)  09/27/15 183 lb 3.2 oz (83.099 kg)    Physical Exam  Constitutional: He is oriented to person, place, and time. He appears well-developed and well-nourished.  HENT:  Head: Normocephalic and atraumatic.  Neck: Neck supple.  Cardiovascular: Normal rate and regular rhythm.   Pulmonary/Chest: Effort normal and breath sounds normal. He has no wheezes.  Abdominal: Soft. Bowel sounds are normal. There is no hepatosplenomegaly. There is no tenderness.  Musculoskeletal: He exhibits no edema.  Lymphadenopathy:    He has no cervical adenopathy.  Neurological: He is alert and oriented to person, place, and time.  Skin: Skin is warm and dry.  Psychiatric: He has a normal mood and affect.  His behavior is normal.  Vitals reviewed.   Results for orders placed or performed in visit on 11/22/15  COMPLETE METABOLIC PANEL WITH GFR  Result Value Ref Range   Sodium 137 135 - 146 mmol/L   Potassium 4.3 3.5 - 5.3 mmol/L   Chloride 101 98 - 110 mmol/L   CO2 24 20 - 31 mmol/L   Glucose, Bld 286 (H) 65 - 99 mg/dL   BUN 8 7 -  25 mg/dL   Creat 1.610.89 0.960.70 - 0.451.33 mg/dL   Total Bilirubin 0.3 0.2 - 1.2 mg/dL   Alkaline Phosphatase 71 40 - 115 U/L   AST 14 10 - 35 U/L   ALT 17 9 - 46 U/L   Total Protein 7.1 6.1 - 8.1 g/dL   Albumin 3.7 3.6 - 5.1 g/dL   Calcium 9.1 8.6 - 40.910.3 mg/dL   GFR, Est African American >89 >=60 mL/min   GFR, Est Non African American >89 >=60 mL/min  HgB A1c  Result Value Ref Range   Hgb A1c MFr Bld 11.8 (H) <5.7 %   Mean Plasma Glucose 292 mg/dL  Lipid Profile  Result Value Ref Range   Cholesterol 201 (H) 125 - 200 mg/dL   Triglycerides 811413 (H) <150 mg/dL   HDL 33 (L) >=91>=40 mg/dL   Total CHOL/HDL Ratio 6.1 (H) <=5.0 Ratio   VLDL NOT CALC <30 mg/dL   LDL Cholesterol NOT CALC <130 mg/dL      Assessment & Plan:   Encounter Diagnoses  Name Primary?  [name]. Uncontrolled type 2 diabetes mellitus with complication, unspecified long term insulin use status (HCC) Yes  . Personal history of noncompliance with medical treatment, presenting hazards to health   . Hyperlipidemia   . Essential hypertension, benign   . Obesity, unspecified   . Special screening for malignant neoplasms, colon     -Reviewed labs with pt -discussed again with pt at some length that the care that he puts into his health/medical regimen or lack thereof will have significant impact on his long-term health and prognosis.  Discussed with pt that his life will be significantly shortened if he doesn't start taking better care with his diabetes and taking an active role in getting his a1c down and his lipids down.  Pt says he understands, he just doesn't know why he doesn't do it -Increase lantus to 20 units -Gave iFOBT -f/u 1 month with bs log

## 2015-12-08 LAB — IFOBT (OCCULT BLOOD): IMMUNOLOGICAL FECAL OCCULT BLOOD TEST: NEGATIVE

## 2016-03-02 ENCOUNTER — Encounter: Payer: Self-pay | Admitting: Physician Assistant

## 2016-03-02 ENCOUNTER — Ambulatory Visit: Payer: Self-pay | Admitting: Physician Assistant

## 2016-03-02 VITALS — BP 148/90 | HR 94 | Temp 97.9°F | Ht 65.0 in | Wt 181.0 lb

## 2016-03-02 DIAGNOSIS — Z125 Encounter for screening for malignant neoplasm of prostate: Secondary | ICD-10-CM

## 2016-03-02 DIAGNOSIS — N529 Male erectile dysfunction, unspecified: Secondary | ICD-10-CM

## 2016-03-02 DIAGNOSIS — E1165 Type 2 diabetes mellitus with hyperglycemia: Secondary | ICD-10-CM

## 2016-03-02 DIAGNOSIS — E785 Hyperlipidemia, unspecified: Secondary | ICD-10-CM

## 2016-03-02 DIAGNOSIS — E118 Type 2 diabetes mellitus with unspecified complications: Principal | ICD-10-CM

## 2016-03-02 DIAGNOSIS — I10 Essential (primary) hypertension: Secondary | ICD-10-CM

## 2016-03-02 MED ORDER — LISINOPRIL-HYDROCHLOROTHIAZIDE 20-12.5 MG PO TABS: 1.0000 | ORAL_TABLET | Freq: Every day | ORAL | Status: DC

## 2016-03-02 NOTE — Patient Instructions (Signed)
Diabetes Mellitus and Food It is important for you to manage your blood sugar (glucose) level. Your blood glucose level can be greatly affected by what you eat. Eating healthier foods in the appropriate amounts throughout the day at about the same time each day will help you control your blood glucose level. It can also help slow or prevent worsening of your diabetes mellitus. Healthy eating may even help you improve the level of your blood pressure and reach or maintain a healthy weight.  General recommendations for healthful eating and cooking habits include:  Eating meals and snacks regularly. Avoid going long periods of time without eating to lose weight.  Eating a diet that consists mainly of plant-based foods, such as fruits, vegetables, nuts, legumes, and whole grains.  Using low-heat cooking methods, such as baking, instead of high-heat cooking methods, such as deep frying. Work with your dietitian to make sure you understand how to use the Nutrition Facts information on food labels. HOW CAN FOOD AFFECT ME? Carbohydrates Carbohydrates affect your blood glucose level more than any other type of food. Your dietitian will help you determine how many carbohydrates to eat at each meal and teach you how to count carbohydrates. Counting carbohydrates is important to keep your blood glucose at a healthy level, especially if you are using insulin or taking certain medicines for diabetes mellitus. Alcohol Alcohol can cause sudden decreases in blood glucose (hypoglycemia), especially if you use insulin or take certain medicines for diabetes mellitus. Hypoglycemia can be a life-threatening condition. Symptoms of hypoglycemia (sleepiness, dizziness, and disorientation) are similar to symptoms of having too much alcohol.  If your health care provider has given you approval to drink alcohol, do so in moderation and use the following guidelines:  Women should not have more than one drink per day, and men  should not have more than two drinks per day. One drink is equal to:  12 oz of beer.  5 oz of wine.  1 oz of hard liquor.  Do not drink on an empty stomach.  Keep yourself hydrated. Have water, diet soda, or unsweetened iced tea.  Regular soda, juice, and other mixers might contain a lot of carbohydrates and should be counted. WHAT FOODS ARE NOT RECOMMENDED? As you make food choices, it is important to remember that all foods are not the same. Some foods have fewer nutrients per serving than other foods, even though they might have the same number of calories or carbohydrates. It is difficult to get your body what it needs when you eat foods with fewer nutrients. Examples of foods that you should avoid that are high in calories and carbohydrates but low in nutrients include:  Trans fats (most processed foods list trans fats on the Nutrition Facts label).  Regular soda.  Juice.  Candy.  Sweets, such as cake, pie, doughnuts, and cookies.  Fried foods. WHAT FOODS CAN I EAT? Eat nutrient-rich foods, which will nourish your body and keep you healthy. The food you should eat also will depend on several factors, including:  The calories you need.  The medicines you take.  Your weight.  Your blood glucose level.  Your blood pressure level.  Your cholesterol level. You should eat a variety of foods, including:  Protein.  Lean cuts of meat.  Proteins low in saturated fats, such as fish, egg whites, and beans. Avoid processed meats.  Fruits and vegetables.  Fruits and vegetables that may help control blood glucose levels, such as apples, mangoes, and   yams.  Dairy products.  Choose fat-free or low-fat dairy products, such as milk, yogurt, and cheese.  Grains, bread, pasta, and rice.  Choose whole grain products, such as multigrain bread, whole oats, and brown rice. These foods may help control blood pressure.  Fats.  Foods containing healthful fats, such as nuts,  avocado, olive oil, canola oil, and fish. DOES EVERYONE WITH DIABETES MELLITUS HAVE THE SAME MEAL PLAN? Because every person with diabetes mellitus is different, there is not one meal plan that works for everyone. It is very important that you meet with a dietitian who will help you create a meal plan that is just right for you.   This information is not intended to replace advice given to you by your health care provider. Make sure you discuss any questions you have with your health care provider.   Document Released: 04/27/2005 Document Revised: 08/21/2014 Document Reviewed: 06/27/2013 Elsevier Interactive Patient Education 2016 [employer]  

## 2016-03-02 NOTE — Progress Notes (Signed)
BP 148/90 mmHg  Pulse 94  Temp(Src) 97.9 F (36.6 C)  Ht 5\' 5"  (1.651 m)  Wt 181 lb (82.101 kg)  BMI 30.12 kg/m2  SpO2 96%   Subjective:    Patient ID: Ricky [name], male    DOB: Oct 22, 1965, 50 y.o.   MRN: [id number]  HPI: Ricky [name] is a 50 y.o. male presenting on 03/02/2016 for Diabetes   HPI  Pt has his new girlfriend with him today  Pt states ED 2-3 years   Relevant past medical, surgical, family and social history reviewed and updated as indicated. Interim medical history since our last visit reviewed. Allergies and medications reviewed and updated.   Current outpatient prescriptions:  .  atorvastatin (LIPITOR) 20 MG tablet, Take 1 tablet (20 mg total) by mouth daily., Disp: 90 tablet, Rfl: 1 .  insulin glargine (LANTUS) 100 UNIT/ML injection, Inject 18 Units into the skin at bedtime. , Disp: , Rfl:  .  lisinopril (PRINIVIL,ZESTRIL) 20 MG tablet, Take 1 tablet (20 mg total) by mouth daily., Disp: 90 tablet, Rfl: 0 .  Omega-3 Fatty Acids (FISH OIL PO), Take 1 tablet by mouth 4 (four) times daily. , Disp: , Rfl:  .  sitaGLIPtin-metformin (JANUMET) 50-1000 MG tablet, Take 1 tablet by mouth 2 (two) times daily with a meal., Disp: 180 tablet, Rfl: 1   Review of Systems  Constitutional: Negative for fever, chills, diaphoresis, appetite change, fatigue and unexpected weight change.  HENT: Negative for congestion, dental problem, drooling, ear pain, facial swelling, hearing loss, mouth sores, sneezing, sore throat, trouble swallowing and voice change.   Eyes: Negative for pain, discharge, redness, itching and visual disturbance.  Respiratory: Negative for cough, choking, shortness of breath and wheezing.   Cardiovascular: Negative for chest pain, palpitations and leg swelling.  Gastrointestinal: Negative for vomiting, abdominal pain, diarrhea, constipation and blood in stool.  Endocrine: Negative for cold intolerance, heat intolerance and polydipsia.   Genitourinary: Negative for dysuria, hematuria and decreased urine volume.  Musculoskeletal: Negative for back pain, arthralgias and gait problem.  Skin: Negative for rash.  Allergic/Immunologic: Negative for environmental allergies.  Neurological: Positive for headaches. Negative for seizures, syncope and light-headedness.  Hematological: Negative for adenopathy.  Psychiatric/Behavioral: Positive for dysphoric mood. Negative for suicidal ideas and agitation. The patient is not nervous/anxious.     Per HPI unless specifically indicated above     Objective:    BP 148/90 mmHg  Pulse 94  Temp(Src) 97.9 F (36.6 C)  Ht 5\' 5"  (1.651 m)  Wt 181 lb (82.101 kg)  BMI 30.12 kg/m2  SpO2 96%  Wt Readings from Last 3 Encounters:  03/02/16 181 lb (82.101 kg)  12/02/15 186 lb 3.2 oz (84.46 kg)  11/01/15 185 lb (83.915 kg)    Physical Exam  Constitutional: He is oriented to person, place, and time. He appears well-developed and well-nourished.  HENT:  Head: Normocephalic and atraumatic.  Neck: Neck supple.  Cardiovascular: Normal rate and regular rhythm.   Pulmonary/Chest: Effort normal and breath sounds normal. He has no wheezes.  Abdominal: Soft. Bowel sounds are normal. There is no hepatosplenomegaly. There is no tenderness.  Musculoskeletal: He exhibits no edema.  Lymphadenopathy:    He has no cervical adenopathy.  Neurological: He is alert and oriented to person, place, and time.  Skin: Skin is warm and dry.  Psychiatric: He has a normal mood and affect. His behavior is normal.  Vitals reviewed.       Assessment & Plan:   Encounter  Diagnoses  Name Primary?  [name] Uncontrolled type 2 diabetes mellitus with complication, unspecified long term insulin use status (HCC) Yes  . Essential hypertension, benign   . Hyperlipidemia   . Erectile dysfunction, unspecified erectile dysfunction type   . Screening for prostate cancer      -Change to lisinopril/hctz for better bp control.  rx sent to [facility] -Pt to get fasting labs drawn tomorrow morning -counseled pt at length about the need to control his diabetes if he wants to help his ED.  Discussed avoidance of sugary drinks like juice and gatorade.  Pt is given more reading information on diabetic diet.  He is also given information on reading labels.    He will monitor his am fbs and bring log sheet to f/u OV in one month

## 2016-03-08 LAB — COMPLETE METABOLIC PANEL WITH GFR
ALBUMIN: 3.8 g/dL (ref 3.6–5.1)
ALK PHOS: 64 U/L (ref 40–115)
ALT: 11 U/L (ref 9–46)
AST: 10 U/L (ref 10–35)
BUN: 11 mg/dL (ref 7–25)
CHLORIDE: 102 mmol/L (ref 98–110)
CO2: 27 mmol/L (ref 20–31)
Calcium: 9.1 mg/dL (ref 8.6–10.3)
Creat: 1.14 mg/dL (ref 0.70–1.33)
GFR, EST NON AFRICAN AMERICAN: 75 mL/min (ref >=60)
GFR, Est African American: 86 mL/min (ref >=60)
GLUCOSE: 259 mg/dL — AB (ref 65–99)
POTASSIUM: 4.7 mmol/L (ref 3.5–5.3)
SODIUM: 138 mmol/L (ref 135–146)
Total Bilirubin: 0.5 mg/dL (ref 0.2–1.2)
Total Protein: 6.9 g/dL (ref 6.1–8.1)

## 2016-03-08 LAB — LIPID PANEL
CHOL/HDL RATIO: 5.3 ratio — AB (ref <=5.0)
Cholesterol: 175 mg/dL (ref 125–200)
HDL: 33 mg/dL — AB (ref >=40)
LDL Cholesterol: 92 mg/dL (ref <130)
Triglycerides: 252 mg/dL — ABNORMAL HIGH (ref <150)
VLDL: 50 mg/dL — AB (ref <30)

## 2016-03-08 LAB — HEMOGLOBIN A1C
HEMOGLOBIN A1C: 11.8 % — AB (ref <5.7)
MEAN PLASMA GLUCOSE: 292 mg/dL

## 2016-03-08 LAB — PSA: PSA: 0.59 ng/mL (ref <=4.00)

## 2016-04-04 ENCOUNTER — Ambulatory Visit: Payer: Self-pay | Admitting: Physician Assistant

## 2016-04-04 ENCOUNTER — Encounter: Payer: Self-pay | Admitting: Physician Assistant

## 2016-04-04 VITALS — BP 126/88 | HR 102 | Temp 97.9°F | Ht 65.0 in | Wt 181.0 lb

## 2016-04-04 DIAGNOSIS — E669 Obesity, unspecified: Secondary | ICD-10-CM

## 2016-04-04 DIAGNOSIS — E1165 Type 2 diabetes mellitus with hyperglycemia: Secondary | ICD-10-CM

## 2016-04-04 DIAGNOSIS — E118 Type 2 diabetes mellitus with unspecified complications: Principal | ICD-10-CM

## 2016-04-04 DIAGNOSIS — I10 Essential (primary) hypertension: Secondary | ICD-10-CM

## 2016-04-04 DIAGNOSIS — E785 Hyperlipidemia, unspecified: Secondary | ICD-10-CM

## 2016-04-04 MED ORDER — ATORVASTATIN CALCIUM 20 MG PO TABS: 20.0000 mg | ORAL_TABLET | Freq: Every day | ORAL | 1 refills | Status: DC

## 2016-04-04 MED ORDER — SITAGLIPTIN PHOS-METFORMIN HCL 50-1000 MG PO TABS: 1.0000 | ORAL_TABLET | Freq: Two times a day (BID) | ORAL | 1 refills | Status: DC

## 2016-04-04 NOTE — Progress Notes (Signed)
BP 126/88 (BP Location: Left Arm, Patient Position: Sitting, Cuff Size: Normal)   Pulse (!) 102   Temp 97.9 F (36.6 C) (Other (Comment))   Ht 5\' 5"  (1.651 m)   Wt 181 lb (82.1 kg)   SpO2 97%   BMI 30.12 kg/m    Subjective:    Patient ID: Ricky [name], male    DOB: 09-Apr-1966, 50 y.o.   MRN: [id number]  HPI: Ricky [name] is a 50 y.o. male presenting on 04/04/2016 for Hypertension and Diabetes   HPI   Pt not taking his janumet- he ran out a long time ago and is taking old metformin.  He didn't stop his lisinopril when he started the lisinopril/hctz- he is taking both  Pt says he is feeling fine today.  Relevant past medical, surgical, family and social history reviewed and updated as indicated. Interim medical history since our last visit reviewed. Allergies and medications reviewed and updated.   Current Outpatient Prescriptions:  .  insulin glargine (LANTUS) 100 UNIT/ML injection, Inject 20 Units into the skin at bedtime. , Disp: , Rfl:  .  lisinopril (PRINIVIL,ZESTRIL) 20 MG tablet, Take 1 tablet (20 mg total) by mouth daily., Disp: 90 tablet, Rfl: 0 .  lisinopril-hydrochlorothiazide (ZESTORETIC) 20-12.5 MG tablet, Take 1 tablet by mouth daily., Disp: 90 tablet, Rfl: 1 .  metFORMIN (GLUCOPHAGE) 1000 MG tablet, Take 1,000 mg by mouth 2 (two) times daily with a meal., Disp: , Rfl:  .  Omega-3 Fatty Acids (FISH OIL PO), Take 1 tablet by mouth 4 (four) times daily. , Disp: , Rfl:  .  atorvastatin (LIPITOR) 20 MG tablet, Take 1 tablet (20 mg total) by mouth daily. (Patient not taking: Reported on 04/04/2016), Disp: 90 tablet, Rfl: 1 .  sitaGLIPtin-metformin (JANUMET) 50-1000 MG tablet, Take 1 tablet by mouth 2 (two) times daily with a meal. (Patient not taking: Reported on 04/04/2016), Disp: 180 tablet, Rfl: 1   Review of Systems  Constitutional: Negative for appetite change, chills, diaphoresis, fatigue, fever and unexpected weight change.  HENT: Negative for  congestion, drooling, ear pain, facial swelling, hearing loss, mouth sores, sneezing, sore throat, trouble swallowing and voice change.   Eyes: Positive for itching. Negative for pain, discharge, redness and visual disturbance.  Respiratory: Negative for cough, choking, shortness of breath and wheezing.   Cardiovascular: Negative for chest pain, palpitations and leg swelling.  Gastrointestinal: Negative for abdominal pain, blood in stool, constipation, diarrhea and vomiting.  Endocrine: Negative for cold intolerance, heat intolerance and polydipsia.  Genitourinary: Negative for decreased urine volume, dysuria and hematuria.  Musculoskeletal: Negative for arthralgias, back pain and gait problem.  Skin: Negative for rash.  Allergic/Immunologic: Negative for environmental allergies.  Neurological: Negative for seizures, syncope, light-headedness and headaches.  Hematological: Negative for adenopathy.  Psychiatric/Behavioral: Positive for dysphoric mood. Negative for agitation and suicidal ideas. The patient is not nervous/anxious.     Per HPI unless specifically indicated above     Objective:    BP 126/88 (BP Location: Left Arm, Patient Position: Sitting, Cuff Size: Normal)   Pulse (!) 102   Temp 97.9 F (36.6 C) (Other (Comment))   Ht 5\' 5"  (1.651 m)   Wt 181 lb (82.1 kg)   SpO2 97%   BMI 30.12 kg/m   Wt Readings from Last 3 Encounters:  04/04/16 181 lb (82.1 kg)  03/02/16 181 lb (82.1 kg)  12/02/15 186 lb 3.2 oz (84.5 kg)    Physical Exam  Constitutional: He is oriented to person,  place, and time. He appears well-developed and well-nourished.  HENT:  Head: Normocephalic and atraumatic.  Neck: Neck supple.  Cardiovascular: Normal rate and regular rhythm.   Pulmonary/Chest: Effort normal and breath sounds normal. He has no wheezes.  Abdominal: Soft. Bowel sounds are normal. There is no hepatosplenomegaly. There is no tenderness.  Musculoskeletal: He exhibits no edema.   Lymphadenopathy:    He has no cervical adenopathy.  Neurological: He is alert and oriented to person, place, and time.  Skin: Skin is warm and dry.  Psychiatric: He has a normal mood and affect. His behavior is normal.  Vitals reviewed.   Results for orders placed or performed in visit on 03/02/16  HgB A1c  Result Value Ref Range   Hgb A1c MFr Bld 11.8 (H) <5.7 %   Mean Plasma Glucose 292 mg/dL  COMPLETE METABOLIC PANEL WITH GFR  Result Value Ref Range   Sodium 138 135 - 146 mmol/L   Potassium 4.7 3.5 - 5.3 mmol/L   Chloride 102 98 - 110 mmol/L   CO2 27 20 - 31 mmol/L   Glucose, Bld 259 (H) 65 - 99 mg/dL   BUN 11 7 - 25 mg/dL   Creat 9.621.14 9.520.70 - 8.411.33 mg/dL   Total Bilirubin 0.5 0.2 - 1.2 mg/dL   Alkaline Phosphatase 64 40 - 115 U/L   AST 10 10 - 35 U/L   ALT 11 9 - 46 U/L   Total Protein 6.9 6.1 - 8.1 g/dL   Albumin 3.8 3.6 - 5.1 g/dL   Calcium 9.1 8.6 - 32.410.3 mg/dL   GFR, Est African American 86 >=60 mL/min   GFR, Est Non African American 75 >=60 mL/min  Lipid Profile  Result Value Ref Range   Cholesterol 175 125 - 200 mg/dL   Triglycerides 401252 (H) <150 mg/dL   HDL 33 (L) >=02>=40 mg/dL   Total CHOL/HDL Ratio 5.3 (H) <=5.0 Ratio   VLDL 50 (H) <30 mg/dL   LDL Cholesterol 92 <725<130 mg/dL  PSA  Result Value Ref Range   PSA 0.59 <=4.00 ng/mL      Assessment & Plan:   Encounter Diagnoses  Name Primary?  [name]. Uncontrolled type 2 diabetes mellitus with complication, unspecified long term insulin use status (HCC) Yes  . Essential hypertension, benign   . Hyperlipidemia   . Obesity, unspecified     -reviewed labs with pt.  a1c still terribly high -Gave pt sample janumet and sent rx to [facility] -rx lipitor sent to [facility]  -Pt counseled to utilize the list of his medications on his AVS to confirm that he is taking what he is supposed to. He is to bring all medicaitions to every appointment (except his insulin) -F/u 1 month to make sure pt taking proper medications. Will  likely increase his insulin at that time.  RTO sooner prn

## 2016-05-04 ENCOUNTER — Encounter: Payer: Self-pay | Admitting: Physician Assistant

## 2016-05-04 ENCOUNTER — Ambulatory Visit: Payer: Self-pay | Admitting: Physician Assistant

## 2016-05-04 VITALS — BP 148/86 | HR 93 | Temp 98.2°F | Ht 65.0 in | Wt 185.8 lb

## 2016-05-04 DIAGNOSIS — E118 Type 2 diabetes mellitus with unspecified complications: Principal | ICD-10-CM

## 2016-05-04 DIAGNOSIS — I10 Essential (primary) hypertension: Secondary | ICD-10-CM

## 2016-05-04 DIAGNOSIS — E785 Hyperlipidemia, unspecified: Secondary | ICD-10-CM

## 2016-05-04 DIAGNOSIS — E1165 Type 2 diabetes mellitus with hyperglycemia: Secondary | ICD-10-CM

## 2016-05-04 DIAGNOSIS — E669 Obesity, unspecified: Secondary | ICD-10-CM

## 2016-05-04 NOTE — Patient Instructions (Signed)
Increase insulin to 22 units each night Bring blood sugar log to next appointment

## 2016-05-04 NOTE — Progress Notes (Signed)
BP (!) 148/86 (BP Location: Left Arm, Patient Position: Sitting, Cuff Size: Normal)   Pulse 93   Temp 98.2 F (36.8 C) (Other (Comment))   Ht 5\' 5"  (1.651 m)   Wt 185 lb 12.8 oz (84.3 kg)   SpO2 97%   BMI 30.92 kg/m    Subjective:    Patient ID: Ricky [name], male    DOB: Jul 22, 1966, 50 y.o.   MRN: [id number]  HPI: Ricky [name] is a 50 y.o. male presenting on 05/04/2016 for Diabetes and Hypertension   HPI   Pt did not bring his bs log with him today.  He says it has been running "140-something".   His lowest was "120- something".  He has not had fbs > 200.  Relevant past medical, surgical, family and social history reviewed and updated as indicated. Interim medical history since our last visit reviewed. Allergies and medications reviewed and updated.   Current Outpatient Prescriptions:  .  insulin glargine (LANTUS) 100 UNIT/ML injection, Inject 20 Units into the skin at bedtime. , Disp: , Rfl:  .  lisinopril-hydrochlorothiazide (ZESTORETIC) 20-12.5 MG tablet, Take 1 tablet by mouth daily., Disp: 90 tablet, Rfl: 1 .  Omega-3 Fatty Acids (FISH OIL PO), Take 1 tablet by mouth 4 (four) times daily. , Disp: , Rfl:  .  sitaGLIPtin-metformin (JANUMET) 50-1000 MG tablet, Take 1 tablet by mouth 2 (two) times daily with a meal., Disp: 180 tablet, Rfl: 1 .  atorvastatin (LIPITOR) 20 MG tablet, Take 1 tablet (20 mg total) by mouth daily. (Patient not taking: Reported on 05/04/2016), Disp: 90 tablet, Rfl: 1   Review of Systems  Constitutional: Negative for appetite change, chills, diaphoresis, fatigue, fever and unexpected weight change.  HENT: Negative for congestion, drooling, ear pain, facial swelling, hearing loss, mouth sores, sneezing, sore throat, trouble swallowing and voice change.   Eyes: Negative for pain, discharge, redness, itching and visual disturbance.  Respiratory: Negative for cough, choking, shortness of breath and wheezing.   Cardiovascular: Negative for chest  pain, palpitations and leg swelling.  Gastrointestinal: Negative for abdominal pain, blood in stool, constipation, diarrhea and vomiting.  Endocrine: Negative for cold intolerance, heat intolerance and polydipsia.  Genitourinary: Negative for decreased urine volume, dysuria and hematuria.  Musculoskeletal: Negative for arthralgias, back pain and gait problem.  Skin: Negative for rash.  Allergic/Immunologic: Negative for environmental allergies.  Neurological: Negative for seizures, syncope, light-headedness and headaches.  Hematological: Negative for adenopathy.  Psychiatric/Behavioral: Negative for agitation, dysphoric mood and suicidal ideas. The patient is not nervous/anxious.     Per HPI unless specifically indicated above     Objective:    BP (!) 148/86 (BP Location: Left Arm, Patient Position: Sitting, Cuff Size: Normal)   Pulse 93   Temp 98.2 F (36.8 C) (Other (Comment))   Ht 5\' 5"  (1.651 m)   Wt 185 lb 12.8 oz (84.3 kg)   SpO2 97%   BMI 30.92 kg/m   Wt Readings from Last 3 Encounters:  05/04/16 185 lb 12.8 oz (84.3 kg)  04/04/16 181 lb (82.1 kg)  03/02/16 181 lb (82.1 kg)    Physical Exam  Constitutional: He is oriented to person, place, and time. He appears well-developed and well-nourished.  HENT:  Head: Normocephalic and atraumatic.  Neck: Neck supple.  Cardiovascular: Normal rate and regular rhythm.   Pulmonary/Chest: Effort normal and breath sounds normal. He has no wheezes.  Abdominal: Soft. Bowel sounds are normal. There is no hepatosplenomegaly. There is no tenderness.  Musculoskeletal: He  exhibits no edema.  Lymphadenopathy:    He has no cervical adenopathy.  Neurological: He is alert and oriented to person, place, and time.  Skin: Skin is warm and dry.  Psychiatric: He has a normal mood and affect. His behavior is normal.  Vitals reviewed.       Assessment & Plan:    Encounter Diagnoses  Name Primary?  [name] Uncontrolled type 2 diabetes mellitus  with complication, unspecified long term insulin use status (HCC) Yes  . Essential hypertension, benign   . Hyperlipidemia   . Obesity, unspecified     -Increase insulin to 22 units each night -continue other medications -call office for fbs < 70 or > 300 -no changes to bp medication. Has been good prior to today. Pt thinks due to him rushing to get to appointment from work -Bring blood sugar log to next appointment in one month

## 2016-05-15 ENCOUNTER — Ambulatory Visit: Payer: Self-pay

## 2016-06-08 ENCOUNTER — Ambulatory Visit: Payer: Self-pay | Admitting: Physician Assistant

## 2016-06-08 ENCOUNTER — Encounter: Payer: Self-pay | Admitting: Physician Assistant

## 2016-06-08 VITALS — BP 148/88 | HR 82 | Wt 178.4 lb

## 2016-06-08 DIAGNOSIS — E118 Type 2 diabetes mellitus with unspecified complications: Principal | ICD-10-CM

## 2016-06-08 DIAGNOSIS — I10 Essential (primary) hypertension: Secondary | ICD-10-CM

## 2016-06-08 DIAGNOSIS — E785 Hyperlipidemia, unspecified: Secondary | ICD-10-CM

## 2016-06-08 DIAGNOSIS — E1165 Type 2 diabetes mellitus with hyperglycemia: Secondary | ICD-10-CM

## 2016-06-08 LAB — COMPREHENSIVE METABOLIC PANEL
ALK PHOS: 87 U/L (ref 40–115)
ALT: 19 U/L (ref 9–46)
AST: 13 U/L (ref 10–35)
Albumin: 4 g/dL (ref 3.6–5.1)
BILIRUBIN TOTAL: 0.5 mg/dL (ref 0.2–1.2)
BUN: 7 mg/dL (ref 7–25)
CALCIUM: 9.7 mg/dL (ref 8.6–10.3)
CO2: 27 mmol/L (ref 20–31)
Chloride: 100 mmol/L (ref 98–110)
Creat: 1 mg/dL (ref 0.70–1.33)
GLUCOSE: 304 mg/dL — AB (ref 65–99)
Potassium: 4.9 mmol/L (ref 3.5–5.3)
Sodium: 136 mmol/L (ref 135–146)
Total Protein: 7.6 g/dL (ref 6.1–8.1)

## 2016-06-08 LAB — LIPID PANEL
Cholesterol: 235 mg/dL — ABNORMAL HIGH (ref 125–200)
HDL: 32 mg/dL — ABNORMAL LOW (ref >=40)
LDL Cholesterol: 144 mg/dL — ABNORMAL HIGH (ref <130)
Total CHOL/HDL Ratio: 7.3 Ratio — ABNORMAL HIGH (ref <=5.0)
Triglycerides: 296 mg/dL — ABNORMAL HIGH (ref <150)
VLDL: 59 mg/dL — ABNORMAL HIGH (ref <30)

## 2016-06-08 LAB — HEMOGLOBIN A1C
HEMOGLOBIN A1C: 13.2 % — AB (ref <5.7)
Mean Plasma Glucose: 332 mg/dL

## 2016-06-08 MED ORDER — ATORVASTATIN CALCIUM 20 MG PO TABS: 20.0000 mg | ORAL_TABLET | Freq: Every day | ORAL | 0 refills | Status: DC

## 2016-06-08 NOTE — Progress Notes (Signed)
BP (!) 148/88 (BP Location: Left Arm, Patient Position: Sitting, Cuff Size: Normal)   Pulse 82   Wt 178 lb 6.4 oz (80.9 kg)   SpO2 98%   BMI 29.69 kg/m    Subjective:    Patient ID: Ricky [name], male    DOB: [birth year], 50 y.o.   MRN: [id number]  HPI: Ricky [name] is a 50 y.o. male presenting on 06/08/2016 for Follow-up   HPI  Pt got a job at [employer] and [employer].  Pt says he will get insurance in 60 days.    Pt says he just ran out of his lipitor he doesn't know how long he has been out.  He says he doesn't know where his bottle is to order refill.    Pt didn't get blood drawn due to he got new job.   Relevant past medical, surgical, family and social history reviewed and updated as indicated. Interim medical history since our last visit reviewed. Allergies and medications reviewed and updated.   Current Outpatient Prescriptions:  .  insulin glargine (LANTUS) 100 UNIT/ML injection, Inject 18 Units into the skin at bedtime. , Disp: , Rfl:  .  lisinopril-hydrochlorothiazide (ZESTORETIC) 20-12.5 MG tablet, Take 1 tablet by mouth daily., Disp: 90 tablet, Rfl: 1 .  Omega-3 Fatty Acids (FISH OIL PO), Take 1 tablet by mouth 4 (four) times daily. , Disp: , Rfl:  .  sitaGLIPtin-metformin (JANUMET) 50-1000 MG tablet, Take 1 tablet by mouth 2 (two) times daily with a meal., Disp: 180 tablet, Rfl: 1 .  atorvastatin (LIPITOR) 20 MG tablet, Take 1 tablet (20 mg total) by mouth daily. (Patient not taking: Reported on 06/08/2016), Disp: 90 tablet, Rfl: 1   Review of Systems  Constitutional: Negative for appetite change, chills, diaphoresis, fatigue, fever and unexpected weight change.  HENT: Negative for congestion, dental problem, drooling, ear pain, facial swelling, hearing loss, mouth sores, sneezing, sore throat, trouble swallowing and voice change.   Eyes: Negative for pain, discharge, redness, itching and visual disturbance.  Respiratory: Negative for cough, choking,  shortness of breath and wheezing.   Cardiovascular: Negative for chest pain, palpitations and leg swelling.  Gastrointestinal: Negative for abdominal pain, blood in stool, constipation, diarrhea and vomiting.  Endocrine: Negative for cold intolerance, heat intolerance and polydipsia.  Genitourinary: Negative for decreased urine volume, dysuria and hematuria.  Musculoskeletal: Negative for arthralgias, back pain and gait problem.  Skin: Negative for rash.  Allergic/Immunologic: Negative for environmental allergies.  Neurological: Negative for seizures, syncope, light-headedness and headaches.  Hematological: Negative for adenopathy.  Psychiatric/Behavioral: Negative for agitation, dysphoric mood and suicidal ideas. The patient is not nervous/anxious.     Per HPI unless specifically indicated above     Objective:    BP (!) 148/88 (BP Location: Left Arm, Patient Position: Sitting, Cuff Size: Normal)   Pulse 82   Wt 178 lb 6.4 oz (80.9 kg)   SpO2 98%   BMI 29.69 kg/m   Wt Readings from Last 3 Encounters:  06/08/16 178 lb 6.4 oz (80.9 kg)  05/04/16 185 lb 12.8 oz (84.3 kg)  04/04/16 181 lb (82.1 kg)    Physical Exam  Constitutional: He is oriented to person, place, and time. He appears well-developed and well-nourished.  HENT:  Head: Normocephalic and atraumatic.  Neck: Neck supple.  Cardiovascular: Normal rate and regular rhythm.   Pulmonary/Chest: Effort normal and breath sounds normal. He has no wheezes.  Abdominal: Soft. Bowel sounds are normal. There is no hepatosplenomegaly. There is no tenderness.  Musculoskeletal: He exhibits no edema.  Lymphadenopathy:    He has no cervical adenopathy.  Neurological: He is alert and oriented to person, place, and time.  Skin: Skin is warm and dry.  Psychiatric: He has a normal mood and affect. His behavior is normal.  Vitals reviewed.       Assessment & Plan:    Encounter Diagnoses  Name Primary?  [name]. Uncontrolled type 2  diabetes mellitus with complication, unspecified long term insulin use status (HCC) Yes  . Hyperlipidemia, unspecified hyperlipidemia type   . Essential hypertension, benign     -Pt to get labs drawn when leaves office today.  Pt to call to get results next week (since he says he doesn't answer his phone when he is at work) -pt counseled to take all of his meds every day as prescribed -F/u 6 wk to recheck bp.  Pt to call to cancel appt if he has insurance.  RTO sooner prn

## 2016-07-20 ENCOUNTER — Ambulatory Visit: Payer: Self-pay | Admitting: Physician Assistant

## 2016-07-20 ENCOUNTER — Encounter: Payer: Self-pay | Admitting: Physician Assistant

## 2016-07-20 VITALS — BP 136/86 | HR 100 | Temp 97.9°F | Ht 65.0 in | Wt 176.5 lb

## 2016-07-20 DIAGNOSIS — I10 Essential (primary) hypertension: Secondary | ICD-10-CM

## 2016-07-20 DIAGNOSIS — E118 Type 2 diabetes mellitus with unspecified complications: Secondary | ICD-10-CM

## 2016-07-20 DIAGNOSIS — Z9119 Patient's noncompliance with other medical treatment and regimen: Secondary | ICD-10-CM

## 2016-07-20 DIAGNOSIS — E1165 Type 2 diabetes mellitus with hyperglycemia: Secondary | ICD-10-CM

## 2016-07-20 DIAGNOSIS — Z91199 Patient's noncompliance with other medical treatment and regimen due to unspecified reason: Secondary | ICD-10-CM

## 2016-07-20 DIAGNOSIS — E785 Hyperlipidemia, unspecified: Secondary | ICD-10-CM

## 2016-07-20 MED ORDER — LOVASTATIN 20 MG PO TABS
20.0000 mg | ORAL_TABLET | Freq: Every day | ORAL | 1 refills | Status: DC
Start: 2016-07-20 — End: 2017-11-25

## 2016-07-20 NOTE — Progress Notes (Signed)
BP 136/86 (BP Location: Left Arm, Patient Position: Sitting, Cuff Size: Normal)   Pulse 100   Temp 97.9 F (36.6 C) (Other (Comment))   Ht 5\' 5"  (1.651 m)   Wt 176 lb 8 oz (80.1 kg)   SpO2 97%   BMI 29.37 kg/m    Subjective:    Patient ID: Ricky [name], male    DOB: 10/16/1965, 50 y.o.   MRN: [id number]  HPI: Ricky [name] is a 50 y.o. male presenting on 07/20/2016 for Hypertension (insurance with [employer] will start in January)   HPI   Pt uncertain what medications he is taking.  He did not bring them with him today.   [facility].  He has ongoing and continuing non-compliance- not taking medications as prescribed, not bringing medications to appointments, not monitoring bs with meter, not eating diabetic diet.  He is a very pleasant gentleman and discussions with him convey that he wants to get his diabetes under control, and he seems to be able to grasp the things that he needs to do to make this happen, but for some reason, he has never been able to follow plans discussed at appointments.     Relevant past medical, surgical, family and social history reviewed and updated as indicated. Interim medical history since our last visit reviewed. Allergies and medications reviewed and updated.  CURRENT MEDS: janumet maybe or maybe metformin still- pt uncertain Insulin lantus 20 u qhs Fish oil  ? Some bp medicaion- doesn't know name Doesn't think  / Not taking atorvastatin  Review of Systems  Constitutional: Negative for appetite change, chills, diaphoresis, fatigue, fever and unexpected weight change.  HENT: Negative for congestion, dental problem, drooling, ear pain, facial swelling, hearing loss, mouth sores, sneezing, sore throat, trouble swallowing and voice change.   Eyes: Negative for pain, discharge, redness, itching and visual disturbance.  Respiratory: Negative for cough, choking, shortness of breath and wheezing.    Cardiovascular: Negative for chest pain, palpitations and leg swelling.  Gastrointestinal: Negative for abdominal pain, blood in stool, constipation, diarrhea and vomiting.  Endocrine: Negative for cold intolerance, heat intolerance and polydipsia.  Genitourinary: Negative for decreased urine volume, dysuria and hematuria.  Musculoskeletal: Negative for arthralgias, back pain and gait problem.  Skin: Negative for rash.  Allergic/Immunologic: Negative for environmental allergies.  Neurological: Negative for seizures, syncope, light-headedness and headaches.  Hematological: Negative for adenopathy.  Psychiatric/Behavioral: Negative for agitation, dysphoric mood and suicidal ideas. The patient is not nervous/anxious.     Per HPI unless specifically indicated above     Objective:    BP 136/86 (BP Location: Left Arm, Patient Position: Sitting, Cuff Size: Normal)   Pulse 100   Temp 97.9 F (36.6 C) (Other (Comment))   Ht 5\' 5"  (1.651 m)   Wt 176 lb 8 oz (80.1 kg)   SpO2 97%   BMI 29.37 kg/m   Wt Readings from Last 3 Encounters:  07/20/16 176 lb 8 oz (80.1 kg)  06/08/16 178 lb 6.4 oz (80.9 kg)  05/04/16 185 lb 12.8 oz (84.3 kg)    Physical Exam  Constitutional: He is oriented to person, place, and time. He appears well-developed and well-nourished.  HENT:  Head: Normocephalic and atraumatic.  Neck: Neck supple.  Cardiovascular: Normal rate and regular rhythm.   Pulmonary/Chest: Effort normal and breath sounds normal. He has no wheezes.  Abdominal: Soft. Bowel sounds are normal. There is no hepatosplenomegaly. There is no tenderness.  Musculoskeletal: He exhibits no edema.  Lymphadenopathy:    He has no cervical adenopathy.  Neurological: He is alert and oriented to person, place, and time.  Skin: Skin is warm and dry.  Psychiatric: He has a normal mood and affect. His behavior is normal.  Vitals reviewed.   Results for orders placed or performed in visit on 06/08/16  HgB  A1c  Result Value Ref Range   Hgb A1c MFr Bld 13.2 (H) <5.7 %   Mean Plasma Glucose 332 mg/dL  Lipid Profile  Result Value Ref Range   Cholesterol 235 (H) 125 - 200 mg/dL   Triglycerides 161296 (H) <150 mg/dL   HDL 32 (L) >=09>=40 mg/dL   Total CHOL/HDL Ratio 7.3 (H) <=5.0 Ratio   VLDL 59 (H) <30 mg/dL   LDL Cholesterol 604144 (H) <130 mg/dL  Comprehensive Metabolic Panel (CMET)  Result Value Ref Range   Sodium 136 135 - 146 mmol/L   Potassium 4.9 3.5 - 5.3 mmol/L   Chloride 100 98 - 110 mmol/L   CO2 27 20 - 31 mmol/L   Glucose, Bld 304 (H) 65 - 99 mg/dL   BUN 7 7 - 25 mg/dL   Creat 5.401.00 9.810.70 - 1.911.33 mg/dL   Total Bilirubin 0.5 0.2 - 1.2 mg/dL   Alkaline Phosphatase 87 40 - 115 U/L   AST 13 10 - 35 U/L   ALT 19 9 - 46 U/L   Total Protein 7.6 6.1 - 8.1 g/dL   Albumin 4.0 3.6 - 5.1 g/dL   Calcium 9.7 8.6 - 47.810.3 mg/dL      Assessment & Plan:   Encounter Diagnoses  Name Primary?  . Personal history of noncompliance with medical treatment, presenting hazards to health Yes  . Uncontrolled type 2 diabetes mellitus with complication, unspecified long term insulin use status (HCC)   . Essential hypertension, benign   . Hyperlipidemia, unspecified hyperlipidemia type     -reviewed labs with pt -discussed with pt the risk involved with adjusting medications while not knowing what he is actually taking -will rx lovastatin due to cost (pt can't afford lipitor and he wouldn't get it from [facility] before his insurance takes effect Jan 1). He is to continue his fish oil -Increase insulin to 25 u -pt is given reading materials on diabetic diet -urged pt to monitor his bs and take log with him to new PCP -urged pt to contact new pcp NOW and get appointment set up to be seen in January -again discussed the long-term ramifications of his ongoing noncompliance ie the damage that the uncontrolled diabetes is doing to his body -follow up with new PCP in January.  RTO here if needed prior to January  1  (The duration of this appointment visit was 25 minutes of face-to-face time with the patient.  Greater than 50% of this time was spent in counseling, explanation of diagnosis, planning of further management, and coordination of care.)

## 2016-07-20 NOTE — Patient Instructions (Signed)
Diabetes Mellitus and Food It is important for you to manage your blood sugar (glucose) level. Your blood glucose level can be greatly affected by what you eat. Eating healthier foods in the appropriate amounts throughout the day at about the same time each day will help you control your blood glucose level. It can also help slow or prevent worsening of your diabetes mellitus. Healthy eating may even help you improve the level of your blood pressure and reach or maintain a healthy weight. General recommendations for healthful eating and cooking habits include:  Eating meals and snacks regularly. Avoid going long periods of time without eating to lose weight.  Eating a diet that consists mainly of plant-based foods, such as fruits, vegetables, nuts, legumes, and whole grains.  Using low-heat cooking methods, such as baking, instead of high-heat cooking methods, such as deep frying.  Work with your dietitian to make sure you understand how to use the Nutrition Facts information on food labels. How can food affect me? Carbohydrates Carbohydrates affect your blood glucose level more than any other type of food. Your dietitian will help you determine how many carbohydrates to eat at each meal and teach you how to count carbohydrates. Counting carbohydrates is important to keep your blood glucose at a healthy level, especially if you are using insulin or taking certain medicines for diabetes mellitus. Alcohol Alcohol can cause sudden decreases in blood glucose (hypoglycemia), especially if you use insulin or take certain medicines for diabetes mellitus. Hypoglycemia can be a life-threatening condition. Symptoms of hypoglycemia (sleepiness, dizziness, and disorientation) are similar to symptoms of having too much alcohol. If your health care provider has given you approval to drink alcohol, do so in moderation and use the following guidelines:  Women should not have more than one drink per day, and men  should not have more than two drinks per day. One drink is equal to: ? 12 oz of beer. ? 5 oz of wine. ? 1 oz of hard liquor.  Do not drink on an empty stomach.  Keep yourself hydrated. Have water, diet soda, or unsweetened iced tea.  Regular soda, juice, and other mixers might contain a lot of carbohydrates and should be counted.  What foods are not recommended? As you make food choices, it is important to remember that all foods are not the same. Some foods have fewer nutrients per serving than other foods, even though they might have the same number of calories or carbohydrates. It is difficult to get your body what it needs when you eat foods with fewer nutrients. Examples of foods that you should avoid that are high in calories and carbohydrates but low in nutrients include:  Trans fats (most processed foods list trans fats on the Nutrition Facts label).  Regular soda.  Juice.  Candy.  Sweets, such as cake, pie, doughnuts, and cookies.  Fried foods.  What foods can I eat? Eat nutrient-rich foods, which will nourish your body and keep you healthy. The food you should eat also will depend on several factors, including:  The calories you need.  The medicines you take.  Your weight.  Your blood glucose level.  Your blood pressure level.  Your cholesterol level.  You should eat a variety of foods, including:  Protein. ? Lean cuts of meat. ? Proteins low in saturated fats, such as fish, egg whites, and beans. Avoid processed meats.  Fruits and vegetables. ? Fruits and vegetables that may help control blood glucose levels, such as apples,   mangoes, and yams.  Dairy products. ? Choose fat-free or low-fat dairy products, such as milk, yogurt, and cheese.  Grains, bread, pasta, and rice. ? Choose whole grain products, such as multigrain bread, whole oats, and brown rice. These foods may help control blood pressure.  Fats. ? Foods containing healthful fats, such as  nuts, avocado, olive oil, canola oil, and fish.  Does everyone with diabetes mellitus have the same meal plan? Because every person with diabetes mellitus is different, there is not one meal plan that works for everyone. It is very important that you meet with a dietitian who will help you create a meal plan that is just right for you. This information is not intended to replace advice given to you by your health care provider. Make sure you discuss any questions you have with your health care provider. Document Released: 04/27/2005 Document Revised: 01/06/2016 Document Reviewed: 06/27/2013 Elsevier Interactive Patient Education  2017 [employer]  

## 2017-11-25 ENCOUNTER — Emergency Department (HOSPITAL_COMMUNITY)
Admission: EM | Admit: 2017-11-25 | Discharge: 2017-11-26 | Disposition: A | Discharge status: Home / Self Care | Payer: No Typology Code available for payment source | Attending: Emergency Medicine | Admitting: Emergency Medicine

## 2017-11-25 ENCOUNTER — Emergency Department (HOSPITAL_COMMUNITY): Payer: No Typology Code available for payment source

## 2017-11-25 ENCOUNTER — Encounter (HOSPITAL_COMMUNITY): Payer: Self-pay | Admitting: *Deleted

## 2017-11-25 ENCOUNTER — Other Ambulatory Visit: Payer: Self-pay

## 2017-11-25 DIAGNOSIS — Y929 Unspecified place or not applicable: Secondary | ICD-10-CM | POA: Insufficient documentation

## 2017-11-25 DIAGNOSIS — Z79899 Other long term (current) drug therapy: Secondary | ICD-10-CM | POA: Insufficient documentation

## 2017-11-25 DIAGNOSIS — S0181XA Laceration without foreign body of other part of head, initial encounter: Secondary | ICD-10-CM | POA: Insufficient documentation

## 2017-11-25 DIAGNOSIS — Y999 Unspecified external cause status: Secondary | ICD-10-CM | POA: Insufficient documentation

## 2017-11-25 DIAGNOSIS — E1165 Type 2 diabetes mellitus with hyperglycemia: Secondary | ICD-10-CM | POA: Insufficient documentation

## 2017-11-25 DIAGNOSIS — Y939 Activity, unspecified: Secondary | ICD-10-CM | POA: Diagnosis not present

## 2017-11-25 DIAGNOSIS — S0101XA Laceration without foreign body of scalp, initial encounter: Secondary | ICD-10-CM

## 2017-11-25 DIAGNOSIS — Z794 Long term (current) use of insulin: Secondary | ICD-10-CM | POA: Diagnosis not present

## 2017-11-25 DIAGNOSIS — Z9114 Patient's other noncompliance with medication regimen: Secondary | ICD-10-CM | POA: Insufficient documentation

## 2017-11-25 DIAGNOSIS — I10 Essential (primary) hypertension: Secondary | ICD-10-CM | POA: Insufficient documentation

## 2017-11-25 DIAGNOSIS — R739 Hyperglycemia, unspecified: Secondary | ICD-10-CM

## 2017-11-25 DIAGNOSIS — Z23 Encounter for immunization: Secondary | ICD-10-CM | POA: Insufficient documentation

## 2017-11-25 DIAGNOSIS — R42 Dizziness and giddiness: Secondary | ICD-10-CM | POA: Insufficient documentation

## 2017-11-25 LAB — APTT: APTT: 28 s (ref 24–36)

## 2017-11-25 LAB — COMPREHENSIVE METABOLIC PANEL
ALK PHOS: 101 U/L (ref 38–126)
ALT: 18 U/L (ref 17–63)
ANION GAP: 12 (ref 5–15)
AST: 18 U/L (ref 15–41)
Albumin: 3.4 g/dL — ABNORMAL LOW (ref 3.5–5.0)
BUN: 10 mg/dL (ref 6–20)
CALCIUM: 9 mg/dL (ref 8.9–10.3)
CO2: 25 mmol/L (ref 22–32)
Chloride: 99 mmol/L — ABNORMAL LOW (ref 101–111)
Creatinine, Ser: 1.04 mg/dL (ref 0.61–1.24)
GFR calc non Af Amer: >60 mL/min (ref >60)
Glucose, Bld: 497 mg/dL — ABNORMAL HIGH (ref 65–99)
POTASSIUM: 3.9 mmol/L (ref 3.5–5.1)
SODIUM: 136 mmol/L (ref 135–145)
Total Bilirubin: 0.3 mg/dL (ref 0.3–1.2)
Total Protein: 7.2 g/dL (ref 6.5–8.1)

## 2017-11-25 LAB — CBC WITH DIFFERENTIAL/PLATELET
BASOS ABS: 0 10*3/uL (ref 0.0–0.1)
BASOS PCT: 0 %
EOS ABS: 0.1 10*3/uL (ref 0.0–0.7)
Eosinophils Relative: 1 %
HEMATOCRIT: 41.6 % (ref 39.0–52.0)
Hemoglobin: 13.6 g/dL (ref 13.0–17.0)
Lymphocytes Relative: 15 %
Lymphs Abs: 1.7 10*3/uL (ref 0.7–4.0)
MCH: 26.4 pg (ref 26.0–34.0)
MCHC: 32.7 g/dL (ref 30.0–36.0)
MCV: 80.8 fL (ref 78.0–100.0)
MONO ABS: 0.8 10*3/uL (ref 0.1–1.0)
MONOS PCT: 7 %
Neutro Abs: 8.9 10*3/uL — ABNORMAL HIGH (ref 1.7–7.7)
Neutrophils Relative %: 77 %
PLATELETS: 284 10*3/uL (ref 150–400)
RBC: 5.15 MIL/uL (ref 4.22–5.81)
RDW: 13 % (ref 11.5–15.5)
WBC: 11.6 10*3/uL — ABNORMAL HIGH (ref 4.0–10.5)

## 2017-11-25 LAB — ETHANOL: Alcohol, Ethyl (B): <10 mg/dL (ref <10)

## 2017-11-25 LAB — PROTIME-INR
INR: 0.96
Prothrombin Time: 12.6 seconds (ref 11.4–15.2)

## 2017-11-25 MED ORDER — ATORVASTATIN CALCIUM 20 MG PO TABS: 20.0000 mg | ORAL_TABLET | Freq: Every day | ORAL | 0 refills | Status: DC

## 2017-11-25 MED ORDER — SODIUM CHLORIDE 0.9 % IV BOLUS
1000.0000 mL | Freq: Once | INTRAVENOUS | Status: AC
  Administered 2017-11-25: 1000 mL via INTRAVENOUS

## 2017-11-25 MED ORDER — INSULIN ASPART 100 UNIT/ML IV SOLN
10.0000 [IU] | Freq: Once | INTRAVENOUS | Status: AC
  Administered 2017-11-25: 10 [IU] via INTRAVENOUS

## 2017-11-25 MED ORDER — LOVASTATIN 20 MG PO TABS: 20.0000 mg | ORAL_TABLET | Freq: Every day | ORAL | 0 refills | Status: DC

## 2017-11-25 MED ORDER — SITAGLIPTIN PHOS-METFORMIN HCL 50-1000 MG PO TABS: 1.0000 | ORAL_TABLET | Freq: Two times a day (BID) | ORAL | 0 refills | Status: DC

## 2017-11-25 MED ORDER — INSULIN ASPART 100 UNIT/ML ~~LOC~~ SOLN
SUBCUTANEOUS | Status: AC
  Filled 2017-11-25: qty 1

## 2017-11-25 MED ORDER — LISINOPRIL-HYDROCHLOROTHIAZIDE 20-12.5 MG PO TABS
1.0000 | ORAL_TABLET | Freq: Every day | ORAL | 0 refills | Status: DC
Start: 2017-11-25 — End: 2019-01-16

## 2017-11-25 MED ORDER — TETANUS-DIPHTH-ACELL PERTUSSIS 5-2.5-18.5 LF-MCG/0.5 IM SUSP
0.5000 mL | Freq: Once | INTRAMUSCULAR | Status: AC
Start: 2017-11-25 — End: 2017-11-26
  Administered 2017-11-26: 0.5 mL via INTRAMUSCULAR
  Filled 2017-11-25: qty 0.5

## 2017-11-25 NOTE — ED Notes (Signed)
Pt returned from CT °

## 2017-11-25 NOTE — ED Notes (Signed)
Family at bedside. 

## 2017-11-25 NOTE — Discharge Instructions (Addendum)
Keep the wound clean and dry. Return to the ED for any problems listed on the head injury sheet. Start taking your medications. You can go to the health department or the free clinic to see if they will be your primary care doctor to keep you on your medications. The staples need to be removed in 1 week.
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
## 2017-11-26 LAB — RAPID URINE DRUG SCREEN, HOSP PERFORMED
Amphetamines: NOT DETECTED
BARBITURATES: NOT DETECTED
Benzodiazepines: NOT DETECTED
Cocaine: NOT DETECTED
Opiates: NOT DETECTED
Tetrahydrocannabinol: NOT DETECTED

## 2017-11-26 LAB — CBC
HEMATOCRIT: 38.1 % — AB (ref 39.0–52.0)
Hemoglobin: 12.5 g/dL — ABNORMAL LOW (ref 13.0–17.0)
MCH: 26.5 pg (ref 26.0–34.0)
MCHC: 32.8 g/dL (ref 30.0–36.0)
MCV: 80.7 fL (ref 78.0–100.0)
Platelets: 269 10*3/uL (ref 150–400)
RBC: 4.72 MIL/uL (ref 4.22–5.81)
RDW: 13.1 % (ref 11.5–15.5)
WBC: 13.8 10*3/uL — ABNORMAL HIGH (ref 4.0–10.5)

## 2017-11-26 LAB — TYPE AND SCREEN
ABO/RH(D): O POS
ANTIBODY SCREEN: NEGATIVE

## 2017-11-26 LAB — CBG MONITORING, ED
GLUCOSE-CAPILLARY: 232 mg/dL — AB (ref 65–99)
Glucose-Capillary: 208 mg/dL — ABNORMAL HIGH (ref 65–99)

## 2017-11-26 MED ORDER — SODIUM CHLORIDE 0.9 % IV BOLUS
1000.0000 mL | Freq: Once | INTRAVENOUS | Status: AC
  Administered 2017-11-26: 1000 mL via INTRAVENOUS
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
## 2017-11-26 NOTE — ED Notes (Signed)
[facility] in speaking with patient.

## 2017-11-26 NOTE — ED Notes (Signed)
Pt wheeled to waiting room. Pt verbalized understanding of discharge instructions.   

## 2017-12-05 ENCOUNTER — Emergency Department (HOSPITAL_COMMUNITY)
Admission: EM | Admit: 2017-12-05 | Discharge: 2017-12-05 | Disposition: A | Discharge status: Home / Self Care | Payer: No Typology Code available for payment source | Attending: Emergency Medicine | Admitting: Emergency Medicine

## 2017-12-05 ENCOUNTER — Encounter (HOSPITAL_COMMUNITY): Payer: Self-pay | Admitting: *Deleted

## 2017-12-05 DIAGNOSIS — I10 Essential (primary) hypertension: Secondary | ICD-10-CM | POA: Insufficient documentation

## 2017-12-05 DIAGNOSIS — E119 Type 2 diabetes mellitus without complications: Secondary | ICD-10-CM | POA: Diagnosis not present

## 2017-12-05 DIAGNOSIS — Z4802 Encounter for removal of sutures: Secondary | ICD-10-CM | POA: Diagnosis present

## 2017-12-05 DIAGNOSIS — Z794 Long term (current) use of insulin: Secondary | ICD-10-CM | POA: Diagnosis not present

## 2017-12-05 DIAGNOSIS — Z79899 Other long term (current) drug therapy: Secondary | ICD-10-CM | POA: Insufficient documentation

## 2017-12-05 NOTE — ED Triage Notes (Signed)
Pt here for staple removal to forehead, pt denies any pain or concerns with site

## 2017-12-05 NOTE — Discharge Instructions (Signed)
Keep your wound clean, wash with mild soap and water twice daily. Applying coco butter to the wound would be very healthy as this continues to heal.
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
## 2018-10-24 ENCOUNTER — Encounter: Payer: Self-pay | Admitting: Family Medicine

## 2018-10-24 ENCOUNTER — Ambulatory Visit (INDEPENDENT_AMBULATORY_CARE_PROVIDER_SITE_OTHER): Payer: Self-pay | Admitting: Family Medicine

## 2018-10-24 ENCOUNTER — Other Ambulatory Visit: Payer: Self-pay

## 2018-10-24 VITALS — BP 142/90 | HR 90 | Temp 98.0°F | Resp 15 | Ht 65.0 in | Wt 165.1 lb

## 2018-10-24 DIAGNOSIS — R6889 Other general symptoms and signs: Secondary | ICD-10-CM

## 2018-10-29 ENCOUNTER — Encounter: Payer: Self-pay | Admitting: Family Medicine

## 2018-11-07 ENCOUNTER — Ambulatory Visit: Payer: Self-pay | Admitting: Family Medicine

## 2019-01-16 ENCOUNTER — Other Ambulatory Visit: Payer: Self-pay | Admitting: Critical Care Medicine

## 2019-01-16 DIAGNOSIS — Z139 Encounter for screening, unspecified: Secondary | ICD-10-CM

## 2019-01-16 LAB — POCT GLYCOSYLATED HEMOGLOBIN (HGB A1C): HbA1c POC (<> result, manual entry): 14 % (ref 4.0–5.6)

## 2019-01-16 LAB — GLUCOSE, POCT (MANUAL RESULT ENTRY): POC Glucose: 332 mg/dl — AB (ref 70–99)

## 2019-01-16 MED ORDER — METFORMIN HCL 1000 MG PO TABS: 1000.0000 mg | ORAL_TABLET | Freq: Two times a day (BID) | ORAL | 1 refills | Status: DC

## 2019-01-16 MED ORDER — LISINOPRIL-HYDROCHLOROTHIAZIDE 20-12.5 MG PO TABS: 1.0000 | ORAL_TABLET | Freq: Every day | ORAL | 1 refills | Status: DC

## 2019-01-16 NOTE — Progress Notes (Signed)
Refills on Lisinopril/HCTZ and Metformin sent to [facility]

## 2019-01-17 ENCOUNTER — Telehealth: Payer: Self-pay | Admitting: Physician Assistant

## 2019-01-17 NOTE — Telephone Encounter (Signed)
Faxed over pt. [employer] application and other information to [facility] on 01/16/2019  -Annamarie Major
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
## 2019-01-20 ENCOUNTER — Ambulatory Visit: Payer: Self-pay | Admitting: Physician Assistant

## 2019-01-21 ENCOUNTER — Other Ambulatory Visit (HOSPITAL_COMMUNITY)
Admission: RE | Admit: 2019-01-21 | Discharge: 2019-01-21 | Disposition: A | Discharge status: Home / Self Care | Payer: Self-pay | Source: Ambulatory Visit | Attending: Physician Assistant | Admitting: Physician Assistant

## 2019-01-21 ENCOUNTER — Ambulatory Visit: Payer: PRIVATE HEALTH INSURANCE | Admitting: Physician Assistant

## 2019-01-21 ENCOUNTER — Other Ambulatory Visit: Payer: Self-pay

## 2019-01-21 ENCOUNTER — Encounter: Payer: Self-pay | Admitting: Physician Assistant

## 2019-01-21 VITALS — BP 120/86 | HR 103 | Temp 98.2°F | Wt 169.8 lb

## 2019-01-21 DIAGNOSIS — I10 Essential (primary) hypertension: Secondary | ICD-10-CM | POA: Insufficient documentation

## 2019-01-21 DIAGNOSIS — IMO0002 Reserved for concepts with insufficient information to code with codable children: Secondary | ICD-10-CM

## 2019-01-21 DIAGNOSIS — Z7689 Persons encountering health services in other specified circumstances: Secondary | ICD-10-CM

## 2019-01-21 DIAGNOSIS — Z125 Encounter for screening for malignant neoplasm of prostate: Secondary | ICD-10-CM

## 2019-01-21 DIAGNOSIS — E785 Hyperlipidemia, unspecified: Secondary | ICD-10-CM

## 2019-01-21 DIAGNOSIS — E118 Type 2 diabetes mellitus with unspecified complications: Secondary | ICD-10-CM | POA: Insufficient documentation

## 2019-01-21 DIAGNOSIS — E1165 Type 2 diabetes mellitus with hyperglycemia: Secondary | ICD-10-CM

## 2019-01-21 LAB — COMPREHENSIVE METABOLIC PANEL
ALT: 15 U/L (ref 0–44)
AST: 12 U/L — ABNORMAL LOW (ref 15–41)
Albumin: 4.1 g/dL (ref 3.5–5.0)
Alkaline Phosphatase: 73 U/L (ref 38–126)
Anion gap: 14 (ref 5–15)
BUN: 15 mg/dL (ref 6–20)
CO2: 22 mmol/L (ref 22–32)
Calcium: 9.5 mg/dL (ref 8.9–10.3)
Chloride: 102 mmol/L (ref 98–111)
Creatinine, Ser: 1 mg/dL (ref 0.61–1.24)
GFR calc Af Amer: >60 mL/min (ref >60)
GFR calc non Af Amer: >60 mL/min (ref >60)
Glucose, Bld: 264 mg/dL — ABNORMAL HIGH (ref 70–99)
Potassium: 4.3 mmol/L (ref 3.5–5.1)
Sodium: 138 mmol/L (ref 135–145)
Total Bilirubin: 0.8 mg/dL (ref 0.3–1.2)
Total Protein: 8 g/dL (ref 6.5–8.1)

## 2019-01-21 LAB — LIPID PANEL
Cholesterol: 244 mg/dL — ABNORMAL HIGH (ref 0–200)
HDL: 34 mg/dL — ABNORMAL LOW (ref >40)
LDL Cholesterol: 153 mg/dL — ABNORMAL HIGH (ref 0–99)
Total CHOL/HDL Ratio: 7.2 RATIO
Triglycerides: 284 mg/dL — ABNORMAL HIGH (ref <150)
VLDL: 57 mg/dL — ABNORMAL HIGH (ref 0–40)

## 2019-01-21 LAB — PSA: Prostatic Specific Antigen: 0.28 ng/mL (ref 0.00–4.00)

## 2019-01-21 MED ORDER — LISINOPRIL-HYDROCHLOROTHIAZIDE 10-12.5 MG PO TABS: 1.0000 | ORAL_TABLET | Freq: Every day | ORAL | 0 refills | Status: DC

## 2019-01-21 MED ORDER — ATORVASTATIN CALCIUM 20 MG PO TABS: 20.0000 mg | ORAL_TABLET | Freq: Every day | ORAL | 0 refills | Status: DC

## 2019-01-21 MED ORDER — INSULIN GLARGINE 100 UNIT/ML SOLOSTAR PEN: 20.0000 [IU] | PEN_INJECTOR | Freq: Every day | SUBCUTANEOUS | 0 refills | Status: DC

## 2019-01-21 MED ORDER — METFORMIN HCL 1000 MG PO TABS: 1000.0000 mg | ORAL_TABLET | Freq: Two times a day (BID) | ORAL | 0 refills | Status: DC

## 2019-01-21 NOTE — Patient Instructions (Signed)
Insulin Injection Instructions, Using Insulin Pens, Adult  A subcutaneous injection is a shot of medicine that is injected into the layer of fat and tissue between skin and muscle. People with type 1 diabetes must take insulin because their bodies do not make it. People with type 2 diabetes may need to take insulin.  There are many different types of insulin. The type of insulin that you take may determine how many injections you give yourself and when you need to give the injections.  Supplies needed:   Soap and water to wash hands.   Your insulin pen.   A new, unused needle.   Alcohol wipes.   A disposal container that is meant for sharp items (sharps container), such as an empty plastic bottle with a cover.  How to choose a site for injection  The body absorbs insulin differently, depending on where the insulin is injected (injection site). It is best to inject insulin into the same body area each time (for example, always in the abdomen), but you should use a different spot in that area for each injection. Do not inject the insulin in the same spot each time. There are five main areas that can be used for injecting. These areas include:   Abdomen. This is the preferred area.   Front of thigh.   Upper, outer side of thigh.   Upper, outer side of arm.   Upper, outer part of buttock.  How to use an insulin pen    First, follow the steps for Get ready, then continue with the steps for Inject the insulin.  Get ready  1. Wash your hands with soap and water. If soap and water are not available, use hand sanitizer.  2. Before you give yourself an insulin injection, be sure to test your blood sugar level (blood glucose level) and write down that number. Follow any instructions from your health care provider about what to do if your blood glucose level is higher or lower than your normal range.  3. Check the expiration date and the type of insulin that is in the pen.  4. If you are using CLEAR insulin, check to  see that it is clear and free of clumps.  5. If you are using CLOUDY insulin, do not shake the pen to get the injection ready. Instead, get it ready in one of these ways:  ? Gently roll the pen between your palms several times.  ? Tip the pen up and down several times.  6. Remove the cap from the insulin pen.  7. Use an alcohol wipe to clean the rubber tip of the pen.  8. Remove the protective paper tab from the disposable needle. Do not let the needle touch anything.  9. Screw a new, unused needle onto the pen.  10. Remove the outer plastic needle cover. Do not throw away the outer plastic cover yet.  ? If the pen uses a special safety needle, leave the inner needle shield in place.  ? If the pen does not use a special safety needle, remove the inner plastic cover from the needle.  11. Follow the manufacturer's instructions to prime the insulin pen with the volume of insulin needed. Hold the pen with the needle pointing up, and push the button on the opposite end of the pen until a drop of insulin appears at the needle tip. If no insulin appears, repeat this step.  12. Turn the button (dial) to the number of units of   insulin that you will be injecting.  Inject the insulin  1. Use an alcohol wipe to clean the site where you will be injecting the needle. Let the site air-dry.  2. Hold the pen in the palm of your writing hand like a pencil.  3. If directed by your health care provider, use your other hand to pinch and hold about an inch (2.5 cm) of skin at the injection site. Do not directly touch the cleaned part of the skin.  4. Gently but quickly, use your writing hand to put the needle straight into the skin. The needle should be at a 90-degree angle (perpendicular) to the skin.  5. When the needle is completely inserted into the skin, use your thumb or index finger of your writing hand to push the top button of the pen down all the way to inject the insulin.  6. Let go of the skin that you are pinching. Continue  to hold the pen in place with your writing hand.  7. Wait 10 seconds, then pull the needle straight out of the skin. This will allow all of the insulin to go from the pen and needle into your body.  8. Carefully put the larger (outer) plastic cover of the needle back over the needle, then unscrew the capped needle and discard it in a sharps container, such as an empty plastic bottle with a cover.  9. Put the plastic cap back on the insulin pen.  How to throw away supplies   Discard all used needles in a puncture-proof sharps disposal container. You can ask your local pharmacy about where you can get this kind of disposal container, or you can use an empty plastic liquid laundry detergent bottle that has a cover.   Follow the disposal regulations for the area where you live. Do not use any needle more than one time.   Throw away empty disposable pens in the regular trash.  Questions to ask your health care provider   How often should I be taking insulin?   How often should I check my blood glucose?   What amount of insulin should I be taking at each time?   What are the side effects?   What should I do if my blood glucose is too high?   What should I do if my blood glucose is too low?   What should I do if I forget to take my insulin?   What number should I call if I have questions?  Where to find more information   American Diabetes Association (ADA): www.diabetes.org   American Association of Diabetes Educators (AADE) Patient Resources: https://www.diabeteseducator.org  Summary   A subcutaneous injection is a shot of medicine that is injected into the layer of fat and tissue between skin and muscle.   Before you give yourself an insulin injection, be sure to test your blood sugar level (blood glucose level) and write down that number.   Check the expiration date and the type of insulin that is in the pen. The type of insulin that you take may determine how many injections you give yourself and when  you need to give the injections.   It is best to inject insulin into the same body area each time (for example, always in the abdomen), but you should use a different spot in that area for each injection.  This information is not intended to replace advice given to you by your health care provider. Make sure you discuss   any questions you have with your health care provider.  Document Released: 09/03/2015 Document Revised: 08/20/2017 Document Reviewed: 09/03/2015  [employer]vier Interactive Patient Education  2019 [employer]

## 2019-01-21 NOTE — Progress Notes (Signed)
BP 120/86   Pulse (!) 103   Temp 98.2 F (36.8 C)   Wt 169 lb 12.8 oz (77 kg)   SpO2 96%   BMI 28.26 kg/m    Subjective:    Patient ID: Ricky [name] Force, male    DOB: March 23, 1966, 53 y.o.   MRN: [id number]  HPI: Ricky [name] Belko is a 53 y.o. male presenting on 01/21/2019 for New Patient (Initial Visit) (returning patient pt last seen here 07/2016. pt states he got insurance but did not establish care at another facility)   HPI   Chief Complaint  Patient presents with  . New Patient (Initial Visit)    returning patient pt last seen here 07/2016. pt states he got insurance but did not establish care at another facility   Pt had negative screeningquestionnairefor CV19   Pt with history of noncompliance    Pt says only complaint is ED    Relevant past medical, surgical, family and social history reviewed and updated as indicated. Interim medical history since our last visit reviewed. Allergies and medications reviewed and updated.   Current Outpatient Medications:  .  lisinopril-hydrochlorothiazide (ZESTORETIC) 10-12.5 MG tablet, Take 1 tablet by mouth daily., Disp: , Rfl:  .  metFORMIN (GLUCOPHAGE) 1000 MG tablet, Take 1 tablet (1,000 mg total) by mouth 2 (two) times daily with a meal., Disp: 60 tablet, Rfl: 1    Review of Systems  Per HPI unless specifically indicated above     Objective:    BP 120/86   Pulse (!) 103   Temp 98.2 F (36.8 C)   Wt 169 lb 12.8 oz (77 kg)   SpO2 96%   BMI 28.26 kg/m   Wt Readings from Last 3 Encounters:  01/21/19 169 lb 12.8 oz (77 kg)  01/16/19 170 lb 3.2 oz (77.2 kg)  10/24/18 165 lb 1.9 oz (74.9 kg)    Physical Exam Vitals signs reviewed.  Constitutional:      General: He is not in acute distress.    Appearance: He is well-developed. He is obese. He is not ill-appearing.  HENT:     Head: Normocephalic and atraumatic.  Eyes:     Conjunctiva/sclera: Conjunctivae normal.     Pupils: Pupils are equal, round, and  reactive to light.  Neck:     Musculoskeletal: Neck supple.     Thyroid: No thyromegaly.  Cardiovascular:     Rate and Rhythm: Normal rate and regular rhythm.  Pulmonary:     Effort: Pulmonary effort is normal.     Breath sounds: Normal breath sounds. No wheezing or rales.  Abdominal:     General: Bowel sounds are normal.     Palpations: Abdomen is soft. There is no mass.     Tenderness: There is no abdominal tenderness.  Musculoskeletal:     Right lower leg: No edema.     Left lower leg: No edema.  Lymphadenopathy:     Cervical: No cervical adenopathy.  Skin:    General: Skin is warm and dry.     Findings: No rash.  Neurological:     Mental Status: He is alert and oriented to person, place, and time.  Psychiatric:        Attention and Perception: Attention normal.        Speech: Speech normal.        Behavior: Behavior normal.     Results for orders placed or performed in visit on 01/16/19  POCT glucose  Result Value Ref Range  POC Glucose 332 (A) 70 - 99 mg/dl  POCT A1C  Result Value Ref Range   Hemoglobin A1C     HbA1c POC (<> result, manual entry) 14 4.0 - 5.6 %   HbA1c, POC (prediabetic range)     HbA1c, POC (controlled diabetic range)        Assessment & Plan:    Encounter Diagnoses  Name Primary?  . Encounter to establish care   . Uncontrolled type 2 diabetes mellitus with complication ([place]) Yes  . Essential hypertension, benign   . Hyperlipidemia, unspecified hyperlipidemia type   . Screening for prostate cancer   \   -Restart lantus at 20u qhs.  Pt needs to monitor bs.  He is to contact office for fbs < 70 or > 300 -pt to Get fasting labs drawn -rx sent to [facility] (medication assistance program).  Pt to continue current rx for bp and dm.  Will restart statin.  -Pt has a smartphone -will follow up with pt 3 weeks to review bs log and labs.  Pt to contact office sooner prn

## 2019-01-22 LAB — MICROALBUMIN, URINE: Microalb, Ur: 125.1 ug/mL — ABNORMAL HIGH

## 2019-02-11 ENCOUNTER — Ambulatory Visit: Payer: PRIVATE HEALTH INSURANCE | Admitting: Physician Assistant

## 2019-04-17 ENCOUNTER — Telehealth: Payer: Self-pay | Admitting: Physician Assistant

## 2020-06-08 ENCOUNTER — Inpatient Hospital Stay (HOSPITAL_COMMUNITY): Payer: Self-pay | Admitting: Anesthesiology

## 2020-06-08 ENCOUNTER — Inpatient Hospital Stay (HOSPITAL_COMMUNITY)
Admission: EM | Admit: 2020-06-08 | Discharge: 2020-06-11 | DRG: 853 | Disposition: A | Discharge status: Rehabilitation Facility | Payer: Self-pay | Attending: Family Medicine | Admitting: Family Medicine

## 2020-06-08 ENCOUNTER — Encounter (HOSPITAL_COMMUNITY)
Admission: EM | Disposition: A | Discharge status: Rehabilitation Facility | Payer: Self-pay | Source: Home / Self Care | Attending: Family Medicine

## 2020-06-08 ENCOUNTER — Encounter (HOSPITAL_COMMUNITY): Payer: Self-pay | Admitting: Emergency Medicine

## 2020-06-08 ENCOUNTER — Emergency Department (HOSPITAL_COMMUNITY): Payer: Self-pay

## 2020-06-08 DIAGNOSIS — N179 Acute kidney failure, unspecified: Secondary | ICD-10-CM | POA: Diagnosis present

## 2020-06-08 DIAGNOSIS — A419 Sepsis, unspecified organism: Principal | ICD-10-CM | POA: Diagnosis present

## 2020-06-08 DIAGNOSIS — E860 Dehydration: Secondary | ICD-10-CM | POA: Diagnosis present

## 2020-06-08 DIAGNOSIS — E1142 Type 2 diabetes mellitus with diabetic polyneuropathy: Secondary | ICD-10-CM | POA: Diagnosis present

## 2020-06-08 DIAGNOSIS — M7989 Other specified soft tissue disorders: Secondary | ICD-10-CM

## 2020-06-08 DIAGNOSIS — Z9114 Patient's other noncompliance with medication regimen: Secondary | ICD-10-CM

## 2020-06-08 DIAGNOSIS — G8191 Hemiplegia, unspecified affecting right dominant side: Secondary | ICD-10-CM | POA: Diagnosis present

## 2020-06-08 DIAGNOSIS — L089 Local infection of the skin and subcutaneous tissue, unspecified: Secondary | ICD-10-CM

## 2020-06-08 DIAGNOSIS — E1110 Type 2 diabetes mellitus with ketoacidosis without coma: Secondary | ICD-10-CM | POA: Diagnosis present

## 2020-06-08 DIAGNOSIS — I10 Essential (primary) hypertension: Secondary | ICD-10-CM | POA: Diagnosis present

## 2020-06-08 DIAGNOSIS — E11628 Type 2 diabetes mellitus with other skin complications: Secondary | ICD-10-CM | POA: Diagnosis present

## 2020-06-08 DIAGNOSIS — L97519 Non-pressure chronic ulcer of other part of right foot with unspecified severity: Secondary | ICD-10-CM | POA: Diagnosis present

## 2020-06-08 DIAGNOSIS — S06890S Other specified intracranial injury without loss of consciousness, sequela: Secondary | ICD-10-CM

## 2020-06-08 DIAGNOSIS — IMO0002 Reserved for concepts with insufficient information to code with codable children: Secondary | ICD-10-CM | POA: Diagnosis present

## 2020-06-08 DIAGNOSIS — Z79899 Other long term (current) drug therapy: Secondary | ICD-10-CM

## 2020-06-08 DIAGNOSIS — E785 Hyperlipidemia, unspecified: Secondary | ICD-10-CM | POA: Diagnosis present

## 2020-06-08 DIAGNOSIS — Z794 Long term (current) use of insulin: Secondary | ICD-10-CM

## 2020-06-08 DIAGNOSIS — E1165 Type 2 diabetes mellitus with hyperglycemia: Secondary | ICD-10-CM | POA: Diagnosis present

## 2020-06-08 DIAGNOSIS — E11621 Type 2 diabetes mellitus with foot ulcer: Secondary | ICD-10-CM | POA: Diagnosis present

## 2020-06-08 DIAGNOSIS — E871 Hypo-osmolality and hyponatremia: Secondary | ICD-10-CM | POA: Diagnosis present

## 2020-06-08 DIAGNOSIS — M726 Necrotizing fasciitis: Secondary | ICD-10-CM | POA: Diagnosis present

## 2020-06-08 DIAGNOSIS — Z20822 Contact with and (suspected) exposure to covid-19: Secondary | ICD-10-CM | POA: Diagnosis present

## 2020-06-08 DIAGNOSIS — Z9119 Patient's noncompliance with other medical treatment and regimen: Secondary | ICD-10-CM

## 2020-06-08 DIAGNOSIS — R6520 Severe sepsis without septic shock: Secondary | ICD-10-CM | POA: Diagnosis present

## 2020-06-08 HISTORY — PX: AMPUTATION: SHX166

## 2020-06-08 LAB — GLUCOSE, CAPILLARY
Glucose-Capillary: 213 mg/dL — ABNORMAL HIGH (ref 70–99)
Glucose-Capillary: 226 mg/dL — ABNORMAL HIGH (ref 70–99)
Glucose-Capillary: 234 mg/dL — ABNORMAL HIGH (ref 70–99)
Glucose-Capillary: 241 mg/dL — ABNORMAL HIGH (ref 70–99)
Glucose-Capillary: 241 mg/dL — ABNORMAL HIGH (ref 70–99)
Glucose-Capillary: 333 mg/dL — ABNORMAL HIGH (ref 70–99)
Glucose-Capillary: 370 mg/dL — ABNORMAL HIGH (ref 70–99)
Glucose-Capillary: 402 mg/dL — ABNORMAL HIGH (ref 70–99)
Glucose-Capillary: 497 mg/dL — ABNORMAL HIGH (ref 70–99)

## 2020-06-08 LAB — CBC WITH DIFFERENTIAL/PLATELET
Band Neutrophils: 3 %
Basophils Absolute: 0 10*3/uL (ref 0.0–0.1)
Basophils Relative: 0 %
Eosinophils Absolute: 0 10*3/uL (ref 0.0–0.5)
Eosinophils Relative: 0 %
HCT: 39.4 % (ref 39.0–52.0)
Hemoglobin: 12.8 g/dL — ABNORMAL LOW (ref 13.0–17.0)
Lymphocytes Relative: 3 %
Lymphs Abs: 0.6 10*3/uL — ABNORMAL LOW (ref 0.7–4.0)
MCH: 26.5 pg (ref 26.0–34.0)
MCHC: 32.5 g/dL (ref 30.0–36.0)
MCV: 81.6 fL (ref 80.0–100.0)
Metamyelocytes Relative: 4 %
Monocytes Absolute: 1.6 10*3/uL — ABNORMAL HIGH (ref 0.1–1.0)
Monocytes Relative: 8 %
Myelocytes: 1 %
Neutro Abs: 16.7 10*3/uL — ABNORMAL HIGH (ref 1.7–7.7)
Neutrophils Relative %: 81 %
Platelets: 392 10*3/uL (ref 150–400)
RBC: 4.83 MIL/uL (ref 4.22–5.81)
RDW: 13.5 % (ref 11.5–15.5)
WBC: 19.9 10*3/uL — ABNORMAL HIGH (ref 4.0–10.5)
nRBC: 0 % (ref 0.0–0.2)

## 2020-06-08 LAB — RESPIRATORY PANEL BY RT PCR (FLU A&B, COVID)
Influenza A by PCR: NEGATIVE
Influenza B by PCR: NEGATIVE
SARS Coronavirus 2 by RT PCR: NEGATIVE

## 2020-06-08 LAB — BASIC METABOLIC PANEL
Anion gap: 13 (ref 5–15)
BUN: 31 mg/dL — ABNORMAL HIGH (ref 6–20)
CO2: 23 mmol/L (ref 22–32)
Calcium: 8 mg/dL — ABNORMAL LOW (ref 8.9–10.3)
Chloride: 99 mmol/L (ref 98–111)
Creatinine, Ser: 1.83 mg/dL — ABNORMAL HIGH (ref 0.61–1.24)
GFR, Estimated: 43 mL/min — ABNORMAL LOW (ref >60)
Glucose, Bld: 248 mg/dL — ABNORMAL HIGH (ref 70–99)
Potassium: 3.7 mmol/L (ref 3.5–5.1)
Sodium: 135 mmol/L (ref 135–145)

## 2020-06-08 LAB — COMPREHENSIVE METABOLIC PANEL
ALT: 21 U/L (ref 0–44)
AST: 17 U/L (ref 15–41)
Albumin: 2.3 g/dL — ABNORMAL LOW (ref 3.5–5.0)
Alkaline Phosphatase: 90 U/L (ref 38–126)
Anion gap: 19 — ABNORMAL HIGH (ref 5–15)
BUN: 36 mg/dL — ABNORMAL HIGH (ref 6–20)
CO2: 21 mmol/L — ABNORMAL LOW (ref 22–32)
Calcium: 8.4 mg/dL — ABNORMAL LOW (ref 8.9–10.3)
Chloride: 84 mmol/L — ABNORMAL LOW (ref 98–111)
Creatinine, Ser: 2.24 mg/dL — ABNORMAL HIGH (ref 0.61–1.24)
GFR, Estimated: 34 mL/min — ABNORMAL LOW (ref >60)
Glucose, Bld: 713 mg/dL (ref 70–99)
Potassium: 4.6 mmol/L (ref 3.5–5.1)
Sodium: 124 mmol/L — ABNORMAL LOW (ref 135–145)
Total Bilirubin: 1.5 mg/dL — ABNORMAL HIGH (ref 0.3–1.2)
Total Protein: 6.7 g/dL (ref 6.5–8.1)

## 2020-06-08 LAB — URINALYSIS, ROUTINE W REFLEX MICROSCOPIC
Bilirubin Urine: NEGATIVE
Glucose, UA: >=500 mg/dL — AB
Ketones, ur: 20 mg/dL — AB
Leukocytes,Ua: NEGATIVE
Nitrite: NEGATIVE
Protein, ur: 30 mg/dL — AB
Specific Gravity, Urine: 1.02 (ref 1.005–1.030)
pH: 5 (ref 5.0–8.0)

## 2020-06-08 LAB — LIPID PANEL
Cholesterol: 132 mg/dL (ref 0–200)
HDL: 21 mg/dL — ABNORMAL LOW (ref >40)
LDL Cholesterol: 72 mg/dL (ref 0–99)
Total CHOL/HDL Ratio: 6.3 RATIO
Triglycerides: 194 mg/dL — ABNORMAL HIGH (ref <150)
VLDL: 39 mg/dL (ref 0–40)

## 2020-06-08 LAB — BLOOD GAS, VENOUS
Acid-base deficit: 2.4 mmol/L — ABNORMAL HIGH (ref 0.0–2.0)
Bicarbonate: 22.5 mmol/L (ref 20.0–28.0)
FIO2: 21
O2 Saturation: 94.3 %
Patient temperature: 37
pCO2, Ven: 36.4 mmHg — ABNORMAL LOW (ref 44.0–60.0)
pH, Ven: 7.394 (ref 7.250–7.430)
pO2, Ven: 72.8 mmHg — ABNORMAL HIGH (ref 32.0–45.0)

## 2020-06-08 LAB — BETA-HYDROXYBUTYRIC ACID
Beta-Hydroxybutyric Acid: 4.05 mmol/L — ABNORMAL HIGH (ref 0.05–0.27)
Beta-Hydroxybutyric Acid: 0.47 mmol/L — ABNORMAL HIGH (ref 0.05–0.27)

## 2020-06-08 LAB — CBG MONITORING, ED
Glucose-Capillary: >600 mg/dL (ref 70–99)
Glucose-Capillary: 571 mg/dL (ref 70–99)

## 2020-06-08 LAB — LACTIC ACID, PLASMA
Lactic Acid, Venous: 1.8 mmol/L (ref 0.5–1.9)
Lactic Acid, Venous: 1.5 mmol/L (ref 0.5–1.9)

## 2020-06-08 LAB — MRSA PCR SCREENING: MRSA by PCR: NEGATIVE

## 2020-06-08 SURGERY — AMPUTATION BELOW KNEE
Anesthesia: Spinal | Site: Leg Lower | Laterality: Right

## 2020-06-08 MED ORDER — ACETAMINOPHEN 325 MG PO TABS: 650.0000 mg | ORAL_TABLET | Freq: Four times a day (QID) | ORAL | Status: DC | PRN

## 2020-06-08 MED ORDER — PROPOFOL 10 MG/ML IV BOLUS
INTRAVENOUS | Status: AC
  Filled 2020-06-08: qty 20

## 2020-06-08 MED ORDER — DROPERIDOL 2.5 MG/ML IJ SOLN: 0.6250 mg | Freq: Once | INTRAMUSCULAR | Status: DC | PRN

## 2020-06-08 MED ORDER — ONDANSETRON HCL 4 MG/2ML IJ SOLN: 4.0000 mg | Freq: Four times a day (QID) | INTRAMUSCULAR | Status: DC | PRN

## 2020-06-08 MED ORDER — SODIUM CHLORIDE 0.9 % IV SOLN: INTRAVENOUS | Status: DC | PRN

## 2020-06-08 MED ORDER — LACTATED RINGERS IV BOLUS (SEPSIS)
1250.0000 mL | Freq: Once | INTRAVENOUS | Status: AC
  Administered 2020-06-08: 1000 mL via INTRAVENOUS

## 2020-06-08 MED ORDER — PROPOFOL 500 MG/50ML IV EMUL
INTRAVENOUS | Status: DC | PRN
  Administered 2020-06-08: 50 ug/kg/min via INTRAVENOUS

## 2020-06-08 MED ORDER — POTASSIUM CHLORIDE 10 MEQ/100ML IV SOLN
10.0000 meq | INTRAVENOUS | Status: AC
  Administered 2020-06-08: 10 meq via INTRAVENOUS
  Filled 2020-06-08: qty 100

## 2020-06-08 MED ORDER — VANCOMYCIN HCL 1750 MG/350ML IV SOLN
1750.0000 mg | Freq: Once | INTRAVENOUS | Status: AC
  Administered 2020-06-08 (×2): 1750 mg via INTRAVENOUS
  Filled 2020-06-08: qty 350

## 2020-06-08 MED ORDER — CHLORHEXIDINE GLUCONATE CLOTH 2 % EX PADS
6.0000 | MEDICATED_PAD | Freq: Once | CUTANEOUS | Status: DC
  Administered 2020-06-08: 6 via TOPICAL

## 2020-06-08 MED ORDER — ONDANSETRON HCL 4 MG PO TABS
4.0000 mg | ORAL_TABLET | Freq: Four times a day (QID) | ORAL | Status: DC | PRN
  Filled 2020-06-08: qty 1

## 2020-06-08 MED ORDER — EPHEDRINE SULFATE 50 MG/ML IJ SOLN
INTRAMUSCULAR | Status: DC | PRN
  Administered 2020-06-08: 10 mg via INTRAVENOUS
  Administered 2020-06-08: 5 mg via INTRAVENOUS

## 2020-06-08 MED ORDER — VANCOMYCIN HCL 750 MG/150ML IV SOLN
750.0000 mg | INTRAVENOUS | Status: DC
  Filled 2020-06-08: qty 150

## 2020-06-08 MED ORDER — ONDANSETRON HCL 4 MG/2ML IJ SOLN: 4.0000 mg | Freq: Once | INTRAMUSCULAR | Status: DC | PRN

## 2020-06-08 MED ORDER — MIDAZOLAM HCL 2 MG/2ML IJ SOLN
INTRAMUSCULAR | Status: AC
  Filled 2020-06-08: qty 2

## 2020-06-08 MED ORDER — MORPHINE SULFATE (PF) 2 MG/ML IV SOLN
2.0000 mg | INTRAVENOUS | Status: DC | PRN
  Administered 2020-06-08 – 2020-06-09 (×4): 2 mg via INTRAVENOUS
  Filled 2020-06-08 (×4): qty 1

## 2020-06-08 MED ORDER — SODIUM CHLORIDE 0.9 % IV BOLUS
500.0000 mL | Freq: Once | INTRAVENOUS | Status: AC
  Administered 2020-06-08: 500 mL via INTRAVENOUS

## 2020-06-08 MED ORDER — PROPOFOL 10 MG/ML IV BOLUS
INTRAVENOUS | Status: AC
  Filled 2020-06-08: qty 40

## 2020-06-08 MED ORDER — LACTATED RINGERS IV SOLN: INTRAVENOUS | Status: AC

## 2020-06-08 MED ORDER — PIPERACILLIN-TAZOBACTAM 3.375 G IVPB 30 MIN
3.3750 g | Freq: Once | INTRAVENOUS | Status: AC
  Administered 2020-06-08: 3.375 g via INTRAVENOUS
  Filled 2020-06-08: qty 50

## 2020-06-08 MED ORDER — BUPIVACAINE HCL (PF) 0.5 % IJ SOLN
INTRAMUSCULAR | Status: AC
  Filled 2020-06-08: qty 30

## 2020-06-08 MED ORDER — INSULIN REGULAR(HUMAN) IN NACL 100-0.9 UT/100ML-% IV SOLN
INTRAVENOUS | Status: DC
  Administered 2020-06-08: 7.5 [IU]/h via INTRAVENOUS
  Administered 2020-06-09: 7 [IU]/h via INTRAVENOUS
  Filled 2020-06-08 (×2): qty 100

## 2020-06-08 MED ORDER — DEXTROSE 50 % IV SOLN: 0.0000 mL | INTRAVENOUS | Status: DC | PRN

## 2020-06-08 MED ORDER — ACETAMINOPHEN 650 MG RE SUPP
650.0000 mg | Freq: Four times a day (QID) | RECTAL | Status: DC | PRN
  Filled 2020-06-08: qty 1

## 2020-06-08 MED ORDER — PHENYLEPHRINE HCL (PRESSORS) 10 MG/ML IV SOLN
INTRAVENOUS | Status: DC | PRN
  Administered 2020-06-08 (×3): 100 ug via INTRAVENOUS

## 2020-06-08 MED ORDER — FENTANYL CITRATE (PF) 100 MCG/2ML IJ SOLN
INTRAMUSCULAR | Status: DC | PRN
  Administered 2020-06-08 (×4): 25 ug via INTRAVENOUS

## 2020-06-08 MED ORDER — PIPERACILLIN-TAZOBACTAM 3.375 G IVPB
3.3750 g | Freq: Three times a day (TID) | INTRAVENOUS | Status: DC
  Administered 2020-06-08 – 2020-06-11 (×9): 3.375 g via INTRAVENOUS
  Filled 2020-06-08 (×12): qty 50

## 2020-06-08 MED ORDER — HEPARIN SODIUM (PORCINE) 5000 UNIT/ML IJ SOLN
5000.0000 [IU] | Freq: Three times a day (TID) | INTRAMUSCULAR | Status: DC
  Administered 2020-06-08 – 2020-06-11 (×8): 5000 [IU] via SUBCUTANEOUS
  Filled 2020-06-08 (×8): qty 1

## 2020-06-08 MED ORDER — OXYCODONE HCL 5 MG PO TABS
5.0000 mg | ORAL_TABLET | ORAL | Status: DC | PRN
  Administered 2020-06-09 – 2020-06-10 (×2): 10 mg via ORAL
  Filled 2020-06-08 (×2): qty 2

## 2020-06-08 MED ORDER — DEXTROSE IN LACTATED RINGERS 5 % IV SOLN: INTRAVENOUS | Status: DC

## 2020-06-08 MED ORDER — ACETAMINOPHEN 10 MG/ML IV SOLN: 1000.0000 mg | Freq: Once | INTRAVENOUS | Status: DC | PRN

## 2020-06-08 MED ORDER — BUPIVACAINE HCL (PF) 0.5 % IJ SOLN
INTRAMUSCULAR | Status: DC | PRN
  Administered 2020-06-08: 3 mL

## 2020-06-08 MED ORDER — FENTANYL CITRATE (PF) 100 MCG/2ML IJ SOLN
INTRAMUSCULAR | Status: AC
  Filled 2020-06-08: qty 2

## 2020-06-08 MED ORDER — EPHEDRINE 5 MG/ML INJ
INTRAVENOUS | Status: AC
  Filled 2020-06-08: qty 10

## 2020-06-08 MED ORDER — HYDROMORPHONE HCL 1 MG/ML IJ SOLN: 0.2500 mg | INTRAMUSCULAR | Status: DC | PRN

## 2020-06-08 MED ORDER — LACTATED RINGERS IV SOLN: INTRAVENOUS | Status: DC

## 2020-06-08 SURGICAL SUPPLY — 37 items
BLADE SAW RECIP 87.9 MT (BLADE) ×3 IMPLANT
BNDG GAUZE ELAST 4 BULKY (GAUZE/BANDAGES/DRESSINGS) ×6 IMPLANT
CLOTH BEACON ORANGE TIMEOUT ST (SAFETY) ×4 IMPLANT
COVER LIGHT HANDLE STERIS (MISCELLANEOUS) ×8 IMPLANT
COVER WAND RF STERILE (DRAPES) ×4 IMPLANT
DRAPE HALF SHEET 40X57 (DRAPES) ×4 IMPLANT
DRSG ADAPTIC 3X8 NADH LF (GAUZE/BANDAGES/DRESSINGS) ×4 IMPLANT
ELECT REM PT RETURN 9FT ADLT (ELECTROSURGICAL) ×4
ELECTRODE REM PT RTRN 9FT ADLT (ELECTROSURGICAL) ×2 IMPLANT
GAUZE KERLIX 2X3 DERM STRL LF (GAUZE/BANDAGES/DRESSINGS) ×7 IMPLANT
GAUZE SPONGE 4X4 12PLY STRL (GAUZE/BANDAGES/DRESSINGS) ×5 IMPLANT
GAUZE XEROFORM 5X9 LF (GAUZE/BANDAGES/DRESSINGS) ×3 IMPLANT
GLOVE BIO SURGEON STRL SZ 6.5 (GLOVE) ×5 IMPLANT
GLOVE BIO SURGEONS STRL SZ 6.5 (GLOVE) ×2
GLOVE BIOGEL PI IND STRL 6.5 (GLOVE) ×2 IMPLANT
GLOVE BIOGEL PI IND STRL 7.0 (GLOVE) ×3 IMPLANT
GLOVE BIOGEL PI INDICATOR 6.5 (GLOVE) ×2
GLOVE BIOGEL PI INDICATOR 7.0 (GLOVE) ×4
GOWN STRL REUS W/TWL LRG LVL3 (GOWN DISPOSABLE) ×14 IMPLANT
KIT TURNOVER KIT A (KITS) ×4 IMPLANT
MANIFOLD NEPTUNE II (INSTRUMENTS) ×4 IMPLANT
NS IRRIG 1000ML POUR BTL (IV SOLUTION) ×4 IMPLANT
PACK BASIC LIMB (CUSTOM PROCEDURE TRAY) ×4 IMPLANT
PAD ABD 5X9 TENDERSORB (GAUZE/BANDAGES/DRESSINGS) ×6 IMPLANT
PAD ARMBOARD 7.5X6 YLW CONV (MISCELLANEOUS) ×4 IMPLANT
SET BASIN LINEN APH (SET/KITS/TRAYS/PACK) ×7 IMPLANT
SPONGE LAP 18X18 RF (DISPOSABLE) ×6 IMPLANT
STAPLER VISISTAT 35W (STAPLE) ×3 IMPLANT
SUT SILK 2 0 (SUTURE) ×4
SUT SILK 2 0 SH (SUTURE) ×6 IMPLANT
SUT SILK 2-0 18XBRD TIE 12 (SUTURE) ×1 IMPLANT
SUT VIC AB 2-0 CT1 27 (SUTURE) ×24
SUT VIC AB 2-0 CT1 TAPERPNT 27 (SUTURE) ×6 IMPLANT
SWAB CULTURE ESWAB REG 1ML (MISCELLANEOUS) ×4 IMPLANT
SWAB CULTURE LIQ STUART DBL (MISCELLANEOUS) ×4 IMPLANT
TRAY FOLEY W/BAG SLVR 16FR (SET/KITS/TRAYS/PACK) ×4
TRAY FOLEY W/BAG SLVR 16FR ST (SET/KITS/TRAYS/PACK) ×1 IMPLANT

## 2020-06-08 NOTE — Transfer of Care (Signed)
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

## 2020-06-08 NOTE — Anesthesia Preprocedure Evaluation (Signed)
Anesthesia Evaluation  Patient identified by MRN, date of birth, ID band Patient awake    Reviewed: Allergy & Precautions, NPO status , Patient's Chart, lab work & pertinent test results  History of Anesthesia Complications Negative for: history of anesthetic complications  Airway Mallampati: II  TM Distance: >3 FB Neck ROM: Full    Dental no notable dental hx.    Pulmonary neg pulmonary ROS,    Pulmonary exam normal breath sounds clear to auscultation       Cardiovascular hypertension, Normal cardiovascular exam Rhythm:Regular Rate:Normal     Neuro/Psych TBI    GI/Hepatic   Endo/Other  diabetes, Poorly Controlled  Renal/GU      Musculoskeletal   Abdominal   Peds  Hematology   Anesthesia Other Findings   Reproductive/Obstetrics                             Anesthesia Physical Anesthesia Plan  ASA: IV  Anesthesia Plan: Spinal   Post-op Pain Management:    Induction: Intravenous  PONV Risk Score and Plan:   Airway Management Planned: Nasal Cannula  Additional Equipment:   Intra-op Plan:   Post-operative Plan:   Informed Consent: I have reviewed the patients History and Physical, chart, labs and discussed the procedure including the risks, benefits and alternatives for the proposed anesthesia with the patient or authorized representative who has indicated his/her understanding and acceptance.     Dental advisory given  Plan Discussed with: CRNA  Anesthesia Plan Comments:         Anesthesia Quick Evaluation

## 2020-06-08 NOTE — Anesthesia Postprocedure Evaluation (Signed)
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   Last Vitals:  Vitals:   06/08/20 1330 06/08/20 1404  BP: 124/89 (!) 151/96  Pulse: (!) 120 (!) 121  Resp: (!) 34 (!) 23  Temp:  (!) 38.1 C  SpO2: 92% 91%    Last Pain:  Vitals:   06/08/20 1404  TempSrc: Oral  PainSc: 5                  Brynda Peon
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
## 2020-06-08 NOTE — ED Triage Notes (Signed)
Per EMS, pt has elevated blood sugar and reports right foot pain/wound/swelling. Pt denies having any medications this am.

## 2020-06-08 NOTE — Progress Notes (Signed)
[facility]  Spoke with aunt in waiting room and mother on the phone. Notified amputation completed. Updated Dr. Sherryll Burger.   Ricky Greenhouse, MD [facility] [address] [place], [place] [address] [phone] (office)

## 2020-06-08 NOTE — ED Triage Notes (Signed)
Sore on bottom of right foot.  Blood sugar over 600.

## 2020-06-08 NOTE — Anesthesia Procedure Notes (Signed)
Spinal  Patient location during procedure: OR Reason for block: at surgeon's request Staffing Anesthesiologist: Jillene Bucks, MD Resident/CRNA: Brynda Peon, CRNA Preanesthetic Checklist Completed: patient identified, IV checked, site marked, risks and benefits discussed, surgical consent, monitors and equipment checked, pre-op evaluation and timeout performed Spinal Block Patient position: sitting Prep: Betadine Patient monitoring: heart rate, cardiac monitor, continuous pulse ox and blood pressure Approach: left paramedian Location: L4-5 Injection technique: single-shot Needle Needle type: Quincke  Needle gauge: 22 G Needle length: 10 cm Assessment Sensory level: T4
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
## 2020-06-08 NOTE — Op Note (Signed)
[facility] Operative Note  06/08/20  Preoperative Diagnosis: Diabetic right foot infection with gas forming organism versus necrotizing infection    Postoperative Diagnosis:  Right foot necrotizing infection   Procedure(s) Performed: Right below knee amputation    Surgeon: Leatrice Jewels. Henreitta Leber, MD   Assistants: No qualified resident was available    Anesthesia: Spinal anesthesia and sedation    Anesthesiologist: Jillene Bucks, MD    Specimens:  Cultures, tissue culture, right foot amputation    Estimated Blood Loss: Minimal   Blood Replacement: None   Tourniquet Time: 30 minutes   Complications: None   Wound Class: Dirty Infected    Operative Indications:  Ricky [name] is a non compliant diabetic who comes in with severely elevated BS to 700s, diabetic foot infection with gas on Xray concerning for a necrotizing infection. He had a leukocytosis, hyponatremia and gas concerning for a necrotizing infection. Given this I consented him for possible debridement of the right foot versus amputation if the tissue is not viable and was necrotic.   Findings: Necrotic infection with purulent drainage, necrotic interosseous muscles    Procedure: The patient was taken to the operating room and placed supine. Spinal anesthesia and monitored anesthesia with sedation was performed. Intravenous antibiotics were not given as he had been on previous doses and cultures were being obtained.  The right foot and leg were prepared and draped in the usual sterile fashion above the knee.  There was crepitus on the dorsal aspect of the foot and drainage out laterally. I made an incision and removed a elliptical portion of skin and subcutaneous tissue from the dorsum of the foot and dark murky purulent drainage was noted. My hand easily slide under the skin to the medial and lateral surface of the foot. The interosseous muscles were necrotic appearing as I took a hemostat and  visualized them. The plantar ulcer on the lateral aspect was probed and went between the metatarsal heads to the dorsum. Given the extent of debridement that would be needed the foot was not viable to salvage in this diabetic who will have wound healing issues in the best of circumstances.   The pneumatic tourniquet was started at 300 mm Hg to help with hemostasis given his palpable pulses distally.  Anterior and posterior incisions were outline with a marking pen. The anterior incision was made about 12 cm below the tibial tuberosity and extended medially and laterally toward the edges of the gastrocnemius muscle. The skin incision was then extended distally on either side parallel to the tibia for about 12 cm to create the posterior flap.   The skin and subcutaneous tissue were incised down to the fascia. The greater and shorter saphenous veins in the medial and posterior aspects were ligated with and divided. The fascia and the muscle were then divided with electrocautery at the same level as the anterior skin incision. The muscles of the anterior and lateral compartment were divided exposing the anterior tibial vessels which were ligated with 3-0 Silk ties and divided. The interosseous membrane was then incised. The tibial periosteum was incised circumferentially with cautery and the periosteal elevator was used to strip the periosteum proximally 2 cm. The tibia was then transected 2 cm proximal to the skin incision with an electric saw. The fibula was then exposed, the periosteal elevator was used, and dissected circumferentially and transected 2 cm proximal to the tibia with the electric saw.   The amputation was then completed with electrocautery transecting the  soleus muscle obliquely and the gastrocnemius muscle at the same level as the posterior flap.  The bleeding soleal veins and posterior tibial and peroneal vessels were clamped and tied with 3-0 silk suture. The tourniquet was let down.  Hemostasis was confirmed. The bony edges were filled and bone paste was applied. The anterior and posterior muscle flaps were approximated with interrupted 2-0 Vicryl sutures. The skin was approximated with staples. A xeroform gauze, loose gauze, kerlix and and ACE were applied.   All counts were correct at the end of the case. The patient was awakened from anesthesia without complication.  The patient went to the PACU in stable condition.   Ricky Greenhouse, MD [facility] [address] R[address] [place], [place] [address] [phone] (office)

## 2020-06-08 NOTE — ED Notes (Signed)
Pt taken to OR for immediate surgery

## 2020-06-08 NOTE — Anesthesia Postprocedure Evaluation (Signed)
Anesthesia Post Note  Patient: Ricky [name]  Procedure(s) Performed: AMPUTATION BELOW KNEE RIGHT LEG (Right Leg Lower)  Patient location during evaluation: PACU Anesthesia Type: Spinal Level of consciousness: oriented and awake and alert Pain management: pain level controlled Vital Signs Assessment: post-procedure vital signs reviewed and stable Respiratory status: spontaneous breathing, respiratory function stable and patient connected to nasal cannula oxygen Cardiovascular status: blood pressure returned to baseline and stable Postop Assessment: no headache, no backache and no apparent nausea or vomiting Anesthetic complications: no   No complications documented.   Last Vitals:  Vitals:   06/08/20 1330 06/08/20 1404  BP: 124/89 (!) 151/96  Pulse: (!) 120 (!) 121  Resp: (!) 34 (!) 23  Temp:  (!) 38.1 C  SpO2: 92% 91%    Last Pain:  Vitals:   06/08/20 1404  TempSrc: Oral  PainSc: 5                  Raji P Mellody Drown

## 2020-06-08 NOTE — Progress Notes (Addendum)
Pharmacy Antibiotic Note  Ricky [name] is a 54 y.o. male admitted on 06/08/2020 with wound infection.  Pharmacy has been consulted for Vancomycin dosing.  Plan: Vancomycin 1750 mg IV x 1 dose. Vancomycin 750 mg IV every 24 hours. Expected AUC 506. Zosyn 3.375g IV every 8 hours. Monitor labs, c/s, and vanco level as indicated.  Height: 5\' 5"  (165.1 cm) Weight: 72.6 kg (160 lb) IBW/kg (Calculated) : 61.5  Temp (24hrs), Avg:100.3 F (37.9 C), Min:100.3 F (37.9 C), Max:100.3 F (37.9 C)  Recent Labs  Lab 06/08/20 1036  WBC 19.9*  CREATININE 2.24*  LATICACIDVEN 1.8    Estimated Creatinine Clearance: 32.8 mL/min (A) (by C-G formula based on SCr of 2.24 mg/dL (H)).    No Known Allergies  Antimicrobials this admission: Vanco 10/26 >> Zosyn 10/26 x  >>   Microbiology results: 10/26 BCx: pending   Thank you for allowing pharmacy to be a part of this patient's care.  11/26 06/08/2020 12:39 PM
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
## 2020-06-08 NOTE — Progress Notes (Signed)
Inpatient Diabetes Program Recommendations  AACE/ADA: New Consensus Statement on Inpatient Glycemic Control (2015)  Target Ranges:  Prepandial:   less than 140 mg/dL      Peak postprandial:   less than 180 mg/dL (1-2 hours)      Critically ill patients:  140 - 180 mg/dL   Lab Results  Component Value Date   GLUCAP 571 (HH) 06/08/2020   HGBA1C 14 01/16/2019    Review of Glycemic Control Results for YVON, MCCORD (MRN [id number]) as of 06/08/2020 13:41  Ref. Range 06/08/2020 10:06 06/08/2020 13:07  Glucose-Capillary Latest Ref Range: 70 - 99 mg/dL >453 (HH) 646 (HH)   Foot wound infection Diabetes history: DM 2 Outpatient Diabetes medications: Lantus and metformin in the past Current orders for Inpatient glycemic control: IV insulin glucose level 713 on presentation  Inpatient Diabetes Program Recommendations:    - Add A1c level  Has not had medications for D in awhile, reports going to free clinic at one time.  No insurance noted.  Thanks,  Christena Deem RN, MSN, BC-ADM Inpatient Diabetes Coordinator Team Pager [phone] (8a-5p)

## 2020-06-08 NOTE — Progress Notes (Signed)
[facility]  Marcelino Duster daughter [phone] called and notified of surgery and possible need for amputation. Mom, [name]'s cell number has changed and is [phone].   Marcelino Duster aware that the infection could be necrotizing and possibility of an amputation and patient getting sicker.  Algis Greenhouse, MD [facility] [address] [place], [place] [address] [phone] (office)

## 2020-06-09 ENCOUNTER — Encounter (HOSPITAL_COMMUNITY): Payer: Self-pay | Admitting: General Surgery

## 2020-06-09 DIAGNOSIS — E1110 Type 2 diabetes mellitus with ketoacidosis without coma: Secondary | ICD-10-CM

## 2020-06-09 LAB — BASIC METABOLIC PANEL
Anion gap: 10 (ref 5–15)
BUN: 27 mg/dL — ABNORMAL HIGH (ref 6–20)
CO2: 24 mmol/L (ref 22–32)
Calcium: 7.9 mg/dL — ABNORMAL LOW (ref 8.9–10.3)
Chloride: 100 mmol/L (ref 98–111)
Creatinine, Ser: 1.6 mg/dL — ABNORMAL HIGH (ref 0.61–1.24)
GFR, Estimated: 51 mL/min — ABNORMAL LOW (ref >60)
Glucose, Bld: 249 mg/dL — ABNORMAL HIGH (ref 70–99)
Potassium: 3.5 mmol/L (ref 3.5–5.1)
Sodium: 134 mmol/L — ABNORMAL LOW (ref 135–145)

## 2020-06-09 LAB — CBC
HCT: 33.7 % — ABNORMAL LOW (ref 39.0–52.0)
Hemoglobin: 11 g/dL — ABNORMAL LOW (ref 13.0–17.0)
MCH: 26.6 pg (ref 26.0–34.0)
MCHC: 32.6 g/dL (ref 30.0–36.0)
MCV: 81.6 fL (ref 80.0–100.0)
Platelets: 360 10*3/uL (ref 150–400)
RBC: 4.13 MIL/uL — ABNORMAL LOW (ref 4.22–5.81)
RDW: 13.5 % (ref 11.5–15.5)
WBC: 15.2 10*3/uL — ABNORMAL HIGH (ref 4.0–10.5)
nRBC: 0 % (ref 0.0–0.2)

## 2020-06-09 LAB — GLUCOSE, CAPILLARY
Glucose-Capillary: 162 mg/dL — ABNORMAL HIGH (ref 70–99)
Glucose-Capillary: 175 mg/dL — ABNORMAL HIGH (ref 70–99)
Glucose-Capillary: 203 mg/dL — ABNORMAL HIGH (ref 70–99)
Glucose-Capillary: 210 mg/dL — ABNORMAL HIGH (ref 70–99)
Glucose-Capillary: 215 mg/dL — ABNORMAL HIGH (ref 70–99)
Glucose-Capillary: 221 mg/dL — ABNORMAL HIGH (ref 70–99)
Glucose-Capillary: 221 mg/dL — ABNORMAL HIGH (ref 70–99)
Glucose-Capillary: 224 mg/dL — ABNORMAL HIGH (ref 70–99)
Glucose-Capillary: 250 mg/dL — ABNORMAL HIGH (ref 70–99)
Glucose-Capillary: 255 mg/dL — ABNORMAL HIGH (ref 70–99)
Glucose-Capillary: 256 mg/dL — ABNORMAL HIGH (ref 70–99)
Glucose-Capillary: 265 mg/dL — ABNORMAL HIGH (ref 70–99)
Glucose-Capillary: 299 mg/dL — ABNORMAL HIGH (ref 70–99)
Glucose-Capillary: 327 mg/dL — ABNORMAL HIGH (ref 70–99)

## 2020-06-09 LAB — COMPREHENSIVE METABOLIC PANEL
ALT: 17 U/L (ref 0–44)
AST: 26 U/L (ref 15–41)
Albumin: 2 g/dL — ABNORMAL LOW (ref 3.5–5.0)
Alkaline Phosphatase: 83 U/L (ref 38–126)
Anion gap: 9 (ref 5–15)
BUN: 24 mg/dL — ABNORMAL HIGH (ref 6–20)
CO2: 23 mmol/L (ref 22–32)
Calcium: 7.9 mg/dL — ABNORMAL LOW (ref 8.9–10.3)
Chloride: 101 mmol/L (ref 98–111)
Creatinine, Ser: 1.53 mg/dL — ABNORMAL HIGH (ref 0.61–1.24)
GFR, Estimated: 54 mL/min — ABNORMAL LOW (ref >60)
Glucose, Bld: 291 mg/dL — ABNORMAL HIGH (ref 70–99)
Potassium: 3.4 mmol/L — ABNORMAL LOW (ref 3.5–5.1)
Sodium: 133 mmol/L — ABNORMAL LOW (ref 135–145)
Total Bilirubin: 0.7 mg/dL (ref 0.3–1.2)
Total Protein: 6.4 g/dL — ABNORMAL LOW (ref 6.5–8.1)

## 2020-06-09 LAB — HIV ANTIBODY (ROUTINE TESTING W REFLEX): HIV Screen 4th Generation wRfx: NONREACTIVE

## 2020-06-09 LAB — BETA-HYDROXYBUTYRIC ACID: Beta-Hydroxybutyric Acid: 1.19 mmol/L — ABNORMAL HIGH (ref 0.05–0.27)

## 2020-06-09 LAB — MAGNESIUM: Magnesium: 2.2 mg/dL (ref 1.7–2.4)

## 2020-06-09 LAB — LACTIC ACID, PLASMA: Lactic Acid, Venous: 1.6 mmol/L (ref 0.5–1.9)

## 2020-06-09 MED ORDER — INSULIN GLARGINE 100 UNIT/ML ~~LOC~~ SOLN
15.0000 [IU] | SUBCUTANEOUS | Status: DC
  Administered 2020-06-09: 15 [IU] via SUBCUTANEOUS
  Filled 2020-06-09 (×3): qty 0.15

## 2020-06-09 MED ORDER — INSULIN ASPART 100 UNIT/ML ~~LOC~~ SOLN
0.0000 [IU] | Freq: Three times a day (TID) | SUBCUTANEOUS | Status: DC
  Administered 2020-06-09: 5 [IU] via SUBCUTANEOUS

## 2020-06-09 MED ORDER — VANCOMYCIN HCL 1250 MG/250ML IV SOLN
1250.0000 mg | INTRAVENOUS | Status: DC
  Administered 2020-06-09 – 2020-06-10 (×2): 1250 mg via INTRAVENOUS
  Filled 2020-06-09 (×2): qty 250

## 2020-06-09 MED ORDER — INSULIN ASPART 100 UNIT/ML ~~LOC~~ SOLN
0.0000 [IU] | Freq: Every day | SUBCUTANEOUS | Status: DC
  Administered 2020-06-09: 2 [IU] via SUBCUTANEOUS
  Administered 2020-06-10: 3 [IU] via SUBCUTANEOUS

## 2020-06-09 MED ORDER — AMLODIPINE BESYLATE 5 MG PO TABS
5.0000 mg | ORAL_TABLET | Freq: Every day | ORAL | Status: DC
  Administered 2020-06-09: 5 mg via ORAL
  Filled 2020-06-09: qty 1

## 2020-06-09 MED ORDER — CHLORHEXIDINE GLUCONATE CLOTH 2 % EX PADS
6.0000 | MEDICATED_PAD | Freq: Every day | CUTANEOUS | Status: DC
  Administered 2020-06-09: 6 via TOPICAL

## 2020-06-09 MED ORDER — 0.9 % SODIUM CHLORIDE (POUR BTL) OPTIME
TOPICAL | Status: DC | PRN
  Administered 2020-06-08: 1000 mL

## 2020-06-09 MED ORDER — LACTATED RINGERS IV BOLUS
1000.0000 mL | Freq: Once | INTRAVENOUS | Status: AC
  Administered 2020-06-09: 1000 mL via INTRAVENOUS

## 2020-06-09 NOTE — Progress Notes (Addendum)
Inpatient Diabetes Program Recommendations  AACE/ADA: New Consensus Statement on Inpatient Glycemic Control (2015)  Target Ranges:  Prepandial:   less than 140 mg/dL      Peak postprandial:   less than 180 mg/dL (1-2 hours)      Critically ill patients:  140 - 180 mg/dL   Lab Results  Component Value Date   GLUCAP 224 (H) 06/09/2020   HGBA1C 14 01/16/2019    Review of Glycemic Control Results for Ricky [name], Ricky [name] (MRN [id number]) as of 06/09/2020 10:01  Ref. Range 06/09/2020 04:03 06/09/2020 05:09 06/09/2020 06:15 06/09/2020 07:13 06/09/2020 10:05  Glucose-Capillary Latest Ref Range: 70 - 99 mg/dL 327 (H) 299 (H) 215 (H) 224 (H) 203 (H)   Diabetes history: DM 2 Outpatient Diabetes medications: none recently Current orders for Inpatient glycemic control: IV insulin/Endotool   Inpatient Diabetes Program Recommendations:    -  Add A1c level  IV insulin gtt at 7-8 units/hour, would need large doses of SQ insulin for glucose control at this point.   -  Remain on IV insulin today to see what pt requires, can calculate SQ insulin regimen later today hopefully if insulin gtt rates come down.  Addendum 1407 pm:  Spoke with pt over the phone. Pt lives with his mother at home and they take care of each other. Pt reports  Running out of meds and just not following up for some time. Pt reports using Lantus as a vial and syringe and still knows how to use it. Pt also confirmed no insurance and that he did go to the free clinic at one time. PT reports he needs a new glucometer. Suggested to pt to check glucose at least twice a day and take medication regularly and follow up at the clinic if able to. Pt reports having transportation to appts.  At time of d/c:  Glucose meter kit order # [id number] Basal insulin whichever is covered by clinic vial vs syringe: Lantus vial (66063) Syringes (01601)  Thanks,  Tama Headings RN, MSN, BC-ADM Inpatient Diabetes Coordinator Team Pager [phone]  (8a-5p)

## 2020-06-09 NOTE — TOC Initial Note (Signed)
Transition of Care [facility]) - Initial/Assessment Note   Patient Details  Name: Ricky [name] MRN: [id number] Date of Birth: 04-24-1966  Transition of Care [facility]) CM/SW Contact:    Ewing Schlein, LCSW Phone Number: 06/09/2020, 11:39 AM  Clinical Narrative: Patient is a 54 year old male who was admitted for necrotizing soft tissue infection. Per chart review, patient does not have insurance or a PCP. CSW called patient to discuss referrals to financial counselor to determine if he is eligible for Medicaid or another financial assistance program and to [facility] at discharge for PCP assistance. Patient agreeable to both referrals. CSW made referral to financial counselor, Jerene Dilling. TOC to follow.  Expected Discharge Plan: Home/Self Care Barriers to Discharge: Continued Medical Work up, Inadequate or no insurance  Patient Goals and CMS Choice Choice offered to / list presented to : Patient  Expected Discharge Plan and Services Expected Discharge Plan: Home/Self Care In-house Referral: Clinical Social Work, Artist, PCP / Management consultant Living arrangements for the past 2 months: Single Family Home  Prior Living Arrangements/Services Living arrangements for the past 2 months: Single Family Home Patient language and need for interpreter reviewed:: Yes Do you feel safe going back to the place where you live?: Yes      Need for Family Participation in Patient Care: No (Comment) Care giver support system in place?: Yes (comment) Criminal Activity/Legal Involvement Pertinent to Current Situation/Hospitalization: No - Comment as needed  Permission Sought/Granted Permission sought to share information with : Other (comment) Research scientist (physical sciences), [facility]) Permission granted to share information with : Yes, Verbal Permission Granted Share Information with NAME: Financial counselor Permission granted to share info w AGENCY: [facility]  Emotional Assessment Appearance::  Appears stated age Attitude/Demeanor/Rapport: Engaged Affect (typically observed): Accepting Orientation: : Oriented to Self, Oriented to Place, Oriented to  Time, Oriented to Situation Alcohol / Substance Use: Not Applicable Psych Involvement: No (comment)  Admission diagnosis:  AKI (acute kidney injury) (HCC) [N17.9] Right foot infection [L08.9] Severe sepsis (HCC) [A41.9, R65.20] Diabetic ketoacidosis without coma associated with type 2 diabetes mellitus (HCC) [E11.10] Sepsis, due to unspecified organism, unspecified whether acute organ dysfunction present [facility]) [A41.9] Patient Active Problem List   Diagnosis Date Noted  . Severe sepsis (HCC) 06/08/2020  . Necrotizing soft tissue infection 06/08/2020  . Right foot infection   . Erectile dysfunction 03/02/2016  . Uncontrolled type 2 diabetes mellitus with complication (HCC) 08/10/2015  . Personal history of noncompliance with medical treatment, presenting hazards to health 08/10/2015  . Essential hypertension, benign 08/10/2015  . Hyperlipidemia 08/10/2015  . Obesity, unspecified 08/10/2015   PCP:  Pcp, No Pharmacy:   [id number] - [place], [place] - [address] [place] [address] Phone: [phone] Fax: [phone]  [id number] - [place],  - [address] AT [facility] [address] [place] [address] Phone: [phone] Fax: [phone]  [facility] [place], [place] - [address] [place] [address] [place] [address] [place] [address] Phone: [phone] Fax: [phone]  [facility] - [place], [place] - [address]  [place] [address] Phone: [phone] Fax: [phone]  Readmission Risk Interventions No flowsheet data found.
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
## 2020-06-09 NOTE — Plan of Care (Signed)
  Problem: Acute Rehab PT Goals(only PT should resolve) Goal: Pt Will Go Supine/Side To Sit Outcome: Progressing Flowsheets (Taken 06/09/2020 1553) Pt will go Supine/Side to Sit: with supervision Goal: Patient Will Transfer Sit To/From Stand Outcome: Progressing Flowsheets (Taken 06/09/2020 1553) Patient will transfer sit to/from stand: with minimal assist Goal: Pt Will Transfer Bed To Chair/Chair To Bed Outcome: Progressing Flowsheets (Taken 06/09/2020 1553) Pt will Transfer Bed to Chair/Chair to Bed:  with min assist  with mod assist Goal: Pt Will Ambulate Outcome: Progressing Flowsheets (Taken 06/09/2020 1553) Pt will Ambulate:  10 feet  with moderate assist  with rolling walker  with minimal assist   3:54 PM, 06/09/20 Ocie Bob, MPT Physical Therapist with [facility] [phone] office [phone] mobile phone

## 2020-06-09 NOTE — Progress Notes (Addendum)
I was present with the medical student for this service. I personally verified the history of present illness, performed the physical exam, and made the plan for this encounter. I have verified the medical student's documentation and made modifications where appropriately. I have personally documented in my own words a brief history, physical, and plan below.     Resting in bed and eating. Says pain is controlled. Dressing intact without any staining. Leukocytosis improving.   Would wait on blood cultures before discontinuing antibiotics.  Will take dressing down tomorrow. Staples will remain in place likely 3-4 weeks.  Will need stump shrinker in the upcoming weeks, may be too painful to place immediately post op  Can start to work with PT.  Algis Greenhouse, MD [facility] [address] [place], [place] [address] [phone] (office)    [facility] Progress Note  1 Day Post-Op   Subjective: Patient doing well, with no concerns. Seen with snacks and drink.   Objective: Vital signs in last 24 hours: Temp:  [98 F (36.7 C)-99.2 F (37.3 C)] 99.2 F (37.3 C) (10/27 1208) Pulse Rate:  [107-122] 120 (10/27 1300) Resp:  [14-33] 33 (10/27 1300) BP: (109-186)/(74-103) 166/98 (10/27 1300) SpO2:  [88 %-100 %] 92 % (10/27 1300) Weight:  [78.8 kg] 78.8 kg (10/26 1900)    Intake/Output from previous day: 10/26 0701 - 10/27 0700 In: 4218.1 [P.O.:50; I.V.:3410.8; IV Piggyback:757.3] Out: 2400 [Urine:2300; Blood:100] Intake/Output this shift: Total I/O In: 529.1 [I.V.:486.9; IV Piggyback:42.2] Out: -   General appearance: alert, cooperative and no distress Resp: normal work of breathing Incision/Wound: hemostatic stump through dressing  Lab Results:  Recent Labs    06/08/20 1036 06/09/20 0517  WBC 19.9* 15.2*  HGB 12.8* 11.0*  HCT 39.4 33.7*  PLT 392 360   BMET Recent Labs    06/09/20 0056 06/09/20 0517  NA 134*  133*  K 3.5 3.4*  CL 100 101  CO2 24 23  GLUCOSE 249* 291*  BUN 27* 24*  CREATININE 1.60* 1.53*  CALCIUM 7.9* 7.9*   PT/INR No results for input(s): LABPROT, INR in the last 72 hours.  Studies/Results: DG Foot Complete Right  Result Date: 06/08/2020 CLINICAL DATA:  Diabetic foot wound EXAM: RIGHT FOOT COMPLETE - 3+ VIEW COMPARISON:  None. FINDINGS: No acute fracture. No malalignment. No focal bony erosion or periostitis identified. Extensive soft tissue gas within the dorsal greater than plantar aspects of the forefoot tracking to the level of the talar neck. Probable soft tissue ulceration underlying the fifth MTP joint. IMPRESSION: 1. Extensive soft tissue gas within the dorsal greater than plantar aspects of the right forefoot tracking to the level of the talar neck. Findings compatible with soft tissue infection with gas-forming organism. 2. No specific radiographic evidence of acute osteomyelitis. Electronically Signed   By: Duanne Guess D.O.   On: 06/08/2020 11:15    Anti-infectives: Anti-infectives (From admission, onward)   Start     Dose/Rate Route Frequency Ordered Stop   06/09/20 1400  vancomycin (VANCOREADY) IVPB 1250 mg/250 mL        1,250 mg 166.7 mL/hr over 90 Minutes Intravenous Every 24 hours 06/09/20 0841     06/09/20 1300  vancomycin (VANCOREADY) IVPB 750 mg/150 mL  Status:  Discontinued        750 mg 150 mL/hr over 60 Minutes Intravenous Every 24 hours 06/08/20 1239 06/09/20 0841   06/08/20 1800  piperacillin-tazobactam (ZOSYN) IVPB 3.375 g  3.375 g 12.5 mL/hr over 240 Minutes Intravenous Every 8 hours 06/08/20 1414     06/08/20 1215  vancomycin (VANCOREADY) IVPB 1750 mg/350 mL        1,750 mg 175 mL/hr over 120 Minutes Intravenous  Once 06/08/20 1206 06/08/20 1407   06/08/20 1030  piperacillin-tazobactam (ZOSYN) IVPB 3.375 g        3.375 g 100 mL/hr over 30 Minutes Intravenous  Once 06/08/20 1020 06/08/20 1255      Assessment/Plan: s/p  Procedure(s): AMPUTATION BELOW KNEE RIGHT LEG  Patient Post-op Day 1 for Right BKA secondary to necrotizing infection in the setting of uncontrolled diabetes. Doing well postoperatively; afebrile with falling WBC count, hemostatic wounds, and normal work of breathing.   -Continued monitoring, with PRN analgesics as needed for pain -Diet advancement as tolerated -Continued Zosyn and vancomycin therapy; & glycemic management   LOS: 1 day    Greer Ee 06/09/2020
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
## 2020-06-10 LAB — CBC
HCT: 33.8 % — ABNORMAL LOW (ref 39.0–52.0)
Hemoglobin: 10.8 g/dL — ABNORMAL LOW (ref 13.0–17.0)
MCH: 26.4 pg (ref 26.0–34.0)
MCHC: 32 g/dL (ref 30.0–36.0)
MCV: 82.6 fL (ref 80.0–100.0)
Platelets: 379 10*3/uL (ref 150–400)
RBC: 4.09 MIL/uL — ABNORMAL LOW (ref 4.22–5.81)
RDW: 13.6 % (ref 11.5–15.5)
WBC: 13.7 10*3/uL — ABNORMAL HIGH (ref 4.0–10.5)
nRBC: 0 % (ref 0.0–0.2)

## 2020-06-10 LAB — GLUCOSE, CAPILLARY
Glucose-Capillary: 284 mg/dL — ABNORMAL HIGH (ref 70–99)
Glucose-Capillary: 327 mg/dL — ABNORMAL HIGH (ref 70–99)
Glucose-Capillary: 255 mg/dL — ABNORMAL HIGH (ref 70–99)
Glucose-Capillary: 284 mg/dL — ABNORMAL HIGH (ref 70–99)

## 2020-06-10 LAB — BASIC METABOLIC PANEL
Anion gap: 11 (ref 5–15)
BUN: 13 mg/dL (ref 6–20)
CO2: 24 mmol/L (ref 22–32)
Calcium: 7.9 mg/dL — ABNORMAL LOW (ref 8.9–10.3)
Chloride: 101 mmol/L (ref 98–111)
Creatinine, Ser: 1.27 mg/dL — ABNORMAL HIGH (ref 0.61–1.24)
GFR, Estimated: >60 mL/min (ref >60)
Glucose, Bld: 252 mg/dL — ABNORMAL HIGH (ref 70–99)
Potassium: 3.2 mmol/L — ABNORMAL LOW (ref 3.5–5.1)
Sodium: 136 mmol/L (ref 135–145)

## 2020-06-10 LAB — HEMOGLOBIN A1C
Hgb A1c MFr Bld: 13.8 % — ABNORMAL HIGH (ref 4.8–5.6)
Hgb A1c MFr Bld: 14.5 % — ABNORMAL HIGH (ref 4.8–5.6)
Mean Plasma Glucose: 349 mg/dL
Mean Plasma Glucose: 369 mg/dL

## 2020-06-10 LAB — PROCALCITONIN: Procalcitonin: 24.85 ng/mL

## 2020-06-10 LAB — MAGNESIUM: Magnesium: 2.1 mg/dL (ref 1.7–2.4)

## 2020-06-10 LAB — SURGICAL PATHOLOGY

## 2020-06-10 MED ORDER — INSULIN GLARGINE 100 UNIT/ML ~~LOC~~ SOLN
20.0000 [IU] | SUBCUTANEOUS | Status: DC
  Administered 2020-06-10: 20 [IU] via SUBCUTANEOUS
  Filled 2020-06-10 (×2): qty 0.2

## 2020-06-10 MED ORDER — AMLODIPINE BESYLATE 5 MG PO TABS
10.0000 mg | ORAL_TABLET | Freq: Every day | ORAL | Status: DC
  Administered 2020-06-10 – 2020-06-11 (×2): 10 mg via ORAL
  Filled 2020-06-10 (×2): qty 2

## 2020-06-10 MED ORDER — LACTATED RINGERS IV BOLUS
1000.0000 mL | Freq: Once | INTRAVENOUS | Status: AC
  Administered 2020-06-10: 1000 mL via INTRAVENOUS

## 2020-06-10 MED ORDER — INSULIN ASPART 100 UNIT/ML ~~LOC~~ SOLN
0.0000 [IU] | Freq: Three times a day (TID) | SUBCUTANEOUS | Status: DC
  Administered 2020-06-10: 5 [IU] via SUBCUTANEOUS
  Administered 2020-06-10 – 2020-06-11 (×2): 8 [IU] via SUBCUTANEOUS
  Administered 2020-06-11: 11 [IU] via SUBCUTANEOUS

## 2020-06-10 MED ORDER — POTASSIUM CHLORIDE CRYS ER 20 MEQ PO TBCR
40.0000 meq | EXTENDED_RELEASE_TABLET | ORAL | Status: AC
  Administered 2020-06-10 (×2): 40 meq via ORAL
  Filled 2020-06-10: qty 2
  Filled 2020-06-10: qty 4

## 2020-06-10 NOTE — Progress Notes (Signed)
[facility] Progress Note  2 Days Post-Op  Subjective: Overall doing well. ACE in place and dry.   Objective: Vital signs in last 24 hours: Temp:  [98.9 F (37.2 C)-100.3 F (37.9 C)] 98.9 F (37.2 C) (10/28 1126) Pulse Rate:  [100-120] 100 (10/28 1126) Resp:  [10-35] 18 (10/28 1126) BP: (149-192)/(82-111) 166/84 (10/28 1126) SpO2:  [89 %-97 %] 96 % (10/28 1126) Weight:  [75.6 kg] 75.6 kg (10/28 0500) Last BM Date: 06/09/20  Intake/Output from previous day: 10/27 0701 - 10/28 0700 In: 2196.1 [P.O.:120; I.V.:1386.9; IV Piggyback:689.2] Out: 1400 [Urine:1400] Intake/Output this shift: Total I/O In: 210.7 [IV Piggyback:210.7] Out: 1525 [Urine:1525]  General appearance: alert and cooperative Extremities: right BKA bandage removed and replaced, no erythema or drainage, no bleeding from staples  Lab Results:  Recent Labs    06/09/20 0517 06/10/20 0413  WBC 15.2* 13.7*  HGB 11.0* 10.8*  HCT 33.7* 33.8*  PLT 360 379   BMET Recent Labs    06/09/20 0517 06/10/20 0413  NA 133* 136  K 3.4* 3.2*  CL 101 101  CO2 23 24  GLUCOSE 291* 252*  BUN 24* 13  CREATININE 1.53* 1.27*  CALCIUM 7.9* 7.9*   Anti-infectives: Anti-infectives (From admission, onward)   Start     Dose/Rate Route Frequency Ordered Stop   06/09/20 1400  vancomycin (VANCOREADY) IVPB 1250 mg/250 mL        1,250 mg 166.7 mL/hr over 90 Minutes Intravenous Every 24 hours 06/09/20 0841     06/09/20 1300  vancomycin (VANCOREADY) IVPB 750 mg/150 mL  Status:  Discontinued        750 mg 150 mL/hr over 60 Minutes Intravenous Every 24 hours 06/08/20 1239 06/09/20 0841   06/08/20 1800  piperacillin-tazobactam (ZOSYN) IVPB 3.375 g        3.375 g 12.5 mL/hr over 240 Minutes Intravenous Every 8 hours 06/08/20 1414     06/08/20 1215  vancomycin (VANCOREADY) IVPB 1750 mg/350 mL        1,750 mg 175 mL/hr over 120 Minutes Intravenous  Once 06/08/20 1206 06/08/20 1407   06/08/20 1030   piperacillin-tazobactam (ZOSYN) IVPB 3.375 g        3.375 g 100 mL/hr over 30 Minutes Intravenous  Once 06/08/20 1020 06/08/20 1255      Assessment/Plan: Mr. Mattern is a 54 yo s/p R BKA for necrotizing infection of the foot. Cultures with Stret and reincubating for better growth and anerobe. Pathology with necrotic ulceration.  Stump shrinker ordered Dressing changes with xeroform, kerlix, ace daily by RN PT working with patient Social work helping with resources   Discussed with Dr. Caleb Popp.    LOS: 2 days    Lucretia Roers 06/10/2020
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
## 2020-06-11 ENCOUNTER — Other Ambulatory Visit: Payer: Self-pay

## 2020-06-11 ENCOUNTER — Encounter (HOSPITAL_COMMUNITY): Payer: Self-pay | Admitting: Physical Medicine & Rehabilitation

## 2020-06-11 ENCOUNTER — Inpatient Hospital Stay (HOSPITAL_COMMUNITY)
Admission: RE | Admit: 2020-06-11 | Discharge: 2020-06-24 | DRG: 560 | Disposition: A | Discharge status: Home / Self Care | Payer: Self-pay | Source: Intra-hospital | Attending: Physical Medicine & Rehabilitation | Admitting: Physical Medicine & Rehabilitation

## 2020-06-11 DIAGNOSIS — Z89511 Acquired absence of right leg below knee: Secondary | ICD-10-CM | POA: Diagnosis present

## 2020-06-11 DIAGNOSIS — Z8249 Family history of ischemic heart disease and other diseases of the circulatory system: Secondary | ICD-10-CM

## 2020-06-11 DIAGNOSIS — S069X9S Unspecified intracranial injury with loss of consciousness of unspecified duration, sequela: Secondary | ICD-10-CM

## 2020-06-11 DIAGNOSIS — E785 Hyperlipidemia, unspecified: Secondary | ICD-10-CM | POA: Diagnosis present

## 2020-06-11 DIAGNOSIS — G8918 Other acute postprocedural pain: Secondary | ICD-10-CM

## 2020-06-11 DIAGNOSIS — G546 Phantom limb syndrome with pain: Secondary | ICD-10-CM | POA: Diagnosis present

## 2020-06-11 DIAGNOSIS — Z4781 Encounter for orthopedic aftercare following surgical amputation: Principal | ICD-10-CM

## 2020-06-11 DIAGNOSIS — D638 Anemia in other chronic diseases classified elsewhere: Secondary | ICD-10-CM

## 2020-06-11 DIAGNOSIS — Z79899 Other long term (current) drug therapy: Secondary | ICD-10-CM

## 2020-06-11 DIAGNOSIS — Z7984 Long term (current) use of oral hypoglycemic drugs: Secondary | ICD-10-CM

## 2020-06-11 DIAGNOSIS — E876 Hypokalemia: Secondary | ICD-10-CM

## 2020-06-11 DIAGNOSIS — E1122 Type 2 diabetes mellitus with diabetic chronic kidney disease: Secondary | ICD-10-CM | POA: Diagnosis present

## 2020-06-11 DIAGNOSIS — N189 Chronic kidney disease, unspecified: Secondary | ICD-10-CM | POA: Diagnosis present

## 2020-06-11 DIAGNOSIS — R531 Weakness: Secondary | ICD-10-CM | POA: Diagnosis present

## 2020-06-11 DIAGNOSIS — I10 Essential (primary) hypertension: Secondary | ICD-10-CM

## 2020-06-11 DIAGNOSIS — D631 Anemia in chronic kidney disease: Secondary | ICD-10-CM | POA: Diagnosis present

## 2020-06-11 DIAGNOSIS — E1165 Type 2 diabetes mellitus with hyperglycemia: Secondary | ICD-10-CM

## 2020-06-11 DIAGNOSIS — I129 Hypertensive chronic kidney disease with stage 1 through stage 4 chronic kidney disease, or unspecified chronic kidney disease: Secondary | ICD-10-CM | POA: Diagnosis present

## 2020-06-11 DIAGNOSIS — Z794 Long term (current) use of insulin: Secondary | ICD-10-CM

## 2020-06-11 DIAGNOSIS — E1142 Type 2 diabetes mellitus with diabetic polyneuropathy: Secondary | ICD-10-CM

## 2020-06-11 DIAGNOSIS — K5903 Drug induced constipation: Secondary | ICD-10-CM

## 2020-06-11 DIAGNOSIS — E118 Type 2 diabetes mellitus with unspecified complications: Secondary | ICD-10-CM

## 2020-06-11 DIAGNOSIS — N179 Acute kidney failure, unspecified: Secondary | ICD-10-CM

## 2020-06-11 DIAGNOSIS — R000 Tachycardia, unspecified: Secondary | ICD-10-CM

## 2020-06-11 LAB — CBC
HCT: 34.7 % — ABNORMAL LOW (ref 39.0–52.0)
HCT: 36.2 % — ABNORMAL LOW (ref 39.0–52.0)
Hemoglobin: 11.3 g/dL — ABNORMAL LOW (ref 13.0–17.0)
Hemoglobin: 11.7 g/dL — ABNORMAL LOW (ref 13.0–17.0)
MCH: 26.3 pg (ref 26.0–34.0)
MCH: 26.2 pg (ref 26.0–34.0)
MCHC: 32.6 g/dL (ref 30.0–36.0)
MCHC: 32.3 g/dL (ref 30.0–36.0)
MCV: 80.9 fL (ref 80.0–100.0)
MCV: 81 fL (ref 80.0–100.0)
Platelets: 450 10*3/uL — ABNORMAL HIGH (ref 150–400)
Platelets: 473 10*3/uL — ABNORMAL HIGH (ref 150–400)
RBC: 4.29 MIL/uL (ref 4.22–5.81)
RBC: 4.47 MIL/uL (ref 4.22–5.81)
RDW: 13.9 % (ref 11.5–15.5)
RDW: 13.8 % (ref 11.5–15.5)
WBC: 11.7 10*3/uL — ABNORMAL HIGH (ref 4.0–10.5)
WBC: 12.1 10*3/uL — ABNORMAL HIGH (ref 4.0–10.5)
nRBC: 0 % (ref 0.0–0.2)
nRBC: 0 % (ref 0.0–0.2)

## 2020-06-11 LAB — BASIC METABOLIC PANEL
Anion gap: 11 (ref 5–15)
BUN: 10 mg/dL (ref 6–20)
CO2: 25 mmol/L (ref 22–32)
Calcium: 8.2 mg/dL — ABNORMAL LOW (ref 8.9–10.3)
Chloride: 101 mmol/L (ref 98–111)
Creatinine, Ser: 1.22 mg/dL (ref 0.61–1.24)
GFR, Estimated: >60 mL/min (ref >60)
Glucose, Bld: 294 mg/dL — ABNORMAL HIGH (ref 70–99)
Potassium: 3.2 mmol/L — ABNORMAL LOW (ref 3.5–5.1)
Sodium: 137 mmol/L (ref 135–145)

## 2020-06-11 LAB — GLUCOSE, CAPILLARY
Glucose-Capillary: 295 mg/dL — ABNORMAL HIGH (ref 70–99)
Glucose-Capillary: 347 mg/dL — ABNORMAL HIGH (ref 70–99)
Glucose-Capillary: 246 mg/dL — ABNORMAL HIGH (ref 70–99)
Glucose-Capillary: 290 mg/dL — ABNORMAL HIGH (ref 70–99)

## 2020-06-11 LAB — PROCALCITONIN: Procalcitonin: 13.23 ng/mL

## 2020-06-11 LAB — VANCOMYCIN, TROUGH: Vancomycin Tr: 4 ug/mL — ABNORMAL LOW (ref 15–20)

## 2020-06-11 LAB — CREATININE, SERUM
Creatinine, Ser: 1.18 mg/dL (ref 0.61–1.24)
GFR, Estimated: >60 mL/min (ref >60)

## 2020-06-11 MED ORDER — HEPARIN SODIUM (PORCINE) 5000 UNIT/ML IJ SOLN
5000.0000 [IU] | Freq: Three times a day (TID) | INTRAMUSCULAR | Status: DC
  Administered 2020-06-11 – 2020-06-24 (×38): 5000 [IU] via SUBCUTANEOUS
  Filled 2020-06-11 (×38): qty 1

## 2020-06-11 MED ORDER — PIPERACILLIN-TAZOBACTAM 3.375 G IVPB
3.3750 g | Freq: Three times a day (TID) | INTRAVENOUS | Status: AC
  Administered 2020-06-11 – 2020-06-14 (×9): 3.375 g via INTRAVENOUS
  Filled 2020-06-11 (×10): qty 50

## 2020-06-11 MED ORDER — AMLODIPINE BESYLATE 10 MG PO TABS: 10.0000 mg | ORAL_TABLET | Freq: Every day | ORAL | Status: DC

## 2020-06-11 MED ORDER — HYDROCHLOROTHIAZIDE 12.5 MG PO CAPS
12.5000 mg | ORAL_CAPSULE | Freq: Every day | ORAL | Status: DC
  Administered 2020-06-12 – 2020-06-14 (×3): 12.5 mg via ORAL
  Filled 2020-06-11 (×3): qty 1

## 2020-06-11 MED ORDER — LISINOPRIL 10 MG PO TABS
10.0000 mg | ORAL_TABLET | Freq: Every day | ORAL | Status: DC
  Administered 2020-06-12 – 2020-06-14 (×3): 10 mg via ORAL
  Filled 2020-06-11 (×3): qty 1

## 2020-06-11 MED ORDER — ONDANSETRON HCL 4 MG PO TABS: 4.0000 mg | ORAL_TABLET | Freq: Four times a day (QID) | ORAL | Status: DC | PRN

## 2020-06-11 MED ORDER — HYDROCHLOROTHIAZIDE 12.5 MG PO CAPS
12.5000 mg | ORAL_CAPSULE | Freq: Every day | ORAL | Status: DC
  Administered 2020-06-11: 12.5 mg via ORAL
  Filled 2020-06-11: qty 1

## 2020-06-11 MED ORDER — LISINOPRIL 10 MG PO TABS
10.0000 mg | ORAL_TABLET | Freq: Every day | ORAL | Status: DC
  Administered 2020-06-11: 10 mg via ORAL
  Filled 2020-06-11: qty 1

## 2020-06-11 MED ORDER — LISINOPRIL-HYDROCHLOROTHIAZIDE 10-12.5 MG PO TABS: 1.0000 | ORAL_TABLET | Freq: Every day | ORAL | Status: DC

## 2020-06-11 MED ORDER — LACTATED RINGERS IV BOLUS
1000.0000 mL | Freq: Once | INTRAVENOUS | Status: AC
  Administered 2020-06-11: 1000 mL via INTRAVENOUS

## 2020-06-11 MED ORDER — VANCOMYCIN HCL 1250 MG/250ML IV SOLN
1250.0000 mg | INTRAVENOUS | Status: DC
  Administered 2020-06-11: 1250 mg via INTRAVENOUS
  Filled 2020-06-11: qty 250

## 2020-06-11 MED ORDER — AMLODIPINE BESYLATE 10 MG PO TABS
10.0000 mg | ORAL_TABLET | Freq: Every day | ORAL | Status: DC
  Administered 2020-06-12 – 2020-06-24 (×13): 10 mg via ORAL
  Filled 2020-06-11 (×13): qty 1

## 2020-06-11 MED ORDER — ATORVASTATIN CALCIUM 10 MG PO TABS
20.0000 mg | ORAL_TABLET | Freq: Every day | ORAL | Status: DC
  Administered 2020-06-12 – 2020-06-24 (×13): 20 mg via ORAL
  Filled 2020-06-11 (×13): qty 2

## 2020-06-11 MED ORDER — ACETAMINOPHEN 325 MG PO TABS: 650.0000 mg | ORAL_TABLET | Freq: Four times a day (QID) | ORAL | Status: DC | PRN

## 2020-06-11 MED ORDER — ONDANSETRON HCL 4 MG/2ML IJ SOLN: 4.0000 mg | Freq: Four times a day (QID) | INTRAMUSCULAR | Status: DC | PRN

## 2020-06-11 MED ORDER — OXYCODONE HCL 5 MG PO TABS
5.0000 mg | ORAL_TABLET | ORAL | Status: DC | PRN
  Administered 2020-06-13 – 2020-06-14 (×2): 5 mg via ORAL
  Administered 2020-06-16 – 2020-06-19 (×4): 10 mg via ORAL
  Filled 2020-06-11: qty 1
  Filled 2020-06-11 (×3): qty 2
  Filled 2020-06-11: qty 1
  Filled 2020-06-11: qty 2

## 2020-06-11 MED ORDER — INSULIN GLARGINE 100 UNIT/ML ~~LOC~~ SOLN
30.0000 [IU] | Freq: Every day | SUBCUTANEOUS | Status: DC
  Administered 2020-06-11: 30 [IU] via SUBCUTANEOUS
  Filled 2020-06-11 (×4): qty 0.3

## 2020-06-11 MED ORDER — HEPARIN SODIUM (PORCINE) 5000 UNIT/ML IJ SOLN: 5000.0000 [IU] | Freq: Three times a day (TID) | INTRAMUSCULAR | Status: DC

## 2020-06-11 MED ORDER — BASAGLAR KWIKPEN 100 UNIT/ML ~~LOC~~ SOPN: 30.0000 [IU] | PEN_INJECTOR | Freq: Every day | SUBCUTANEOUS | Status: DC

## 2020-06-11 MED ORDER — VANCOMYCIN HCL 1250 MG/250ML IV SOLN
1250.0000 mg | INTRAVENOUS | Status: DC
Start: 2020-06-11 — End: 2020-06-24

## 2020-06-11 MED ORDER — ACETAMINOPHEN 650 MG RE SUPP: 650.0000 mg | Freq: Four times a day (QID) | RECTAL | Status: DC | PRN

## 2020-06-11 MED ORDER — INSULIN ASPART 100 UNIT/ML ~~LOC~~ SOLN
0.0000 [IU] | Freq: Three times a day (TID) | SUBCUTANEOUS | Status: DC
  Administered 2020-06-11: 5 [IU] via SUBCUTANEOUS
  Administered 2020-06-12: 11 [IU] via SUBCUTANEOUS
  Administered 2020-06-12: 15 [IU] via SUBCUTANEOUS
  Administered 2020-06-12 – 2020-06-13 (×3): 5 [IU] via SUBCUTANEOUS
  Administered 2020-06-13: 11 [IU] via SUBCUTANEOUS
  Administered 2020-06-14: 8 [IU] via SUBCUTANEOUS
  Administered 2020-06-14: 5 [IU] via SUBCUTANEOUS
  Administered 2020-06-14: 3 [IU] via SUBCUTANEOUS
  Administered 2020-06-15: 8 [IU] via SUBCUTANEOUS
  Administered 2020-06-15: 11 [IU] via SUBCUTANEOUS
  Administered 2020-06-15: 3 [IU] via SUBCUTANEOUS
  Administered 2020-06-16: 5 [IU] via SUBCUTANEOUS
  Administered 2020-06-16: 2 [IU] via SUBCUTANEOUS
  Administered 2020-06-16 – 2020-06-17 (×3): 3 [IU] via SUBCUTANEOUS
  Administered 2020-06-17 – 2020-06-18 (×2): 2 [IU] via SUBCUTANEOUS
  Administered 2020-06-18 – 2020-06-21 (×3): 3 [IU] via SUBCUTANEOUS
  Administered 2020-06-21 (×2): 2 [IU] via SUBCUTANEOUS
  Administered 2020-06-22: 3 [IU] via SUBCUTANEOUS
  Administered 2020-06-22 (×2): 2 [IU] via SUBCUTANEOUS
  Administered 2020-06-23: 3 [IU] via SUBCUTANEOUS
  Administered 2020-06-23: 2 [IU] via SUBCUTANEOUS

## 2020-06-11 MED ORDER — PIPERACILLIN-TAZOBACTAM 3.375 G IVPB: 3.3750 g | Freq: Three times a day (TID) | INTRAVENOUS | Status: DC

## 2020-06-11 MED ORDER — INSULIN GLARGINE 100 UNIT/ML ~~LOC~~ SOLN
30.0000 [IU] | Freq: Every day | SUBCUTANEOUS | Status: DC
  Administered 2020-06-12 – 2020-06-24 (×13): 30 [IU] via SUBCUTANEOUS
  Filled 2020-06-11 (×13): qty 0.3
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
## 2020-06-11 NOTE — Progress Notes (Signed)
Inpatient Rehabilitation-Admissions Coordinator   Spoke with pt via phone for rehab assessment. Feel patient is an appropriate candidate for CIR. Pt is interested in rehab program at [facility] and has agreed to estimated daily cost of care. Contacted financial counseling with his permission for Medicaid screening. AC has received medical clearance from Dr. Caleb Popp for admit to CIR today. RN and [facility] team aware of plan for admit.   AC will place note in chart once transport time arranged.   Cheri Rous, OTR/L  Rehab Admissions Coordinator  [phone] 06/11/2020 12:42 PM

## 2020-06-11 NOTE — Discharge Summary (Signed)
Physician Discharge Summary  Ricky [name] ZOX:[id number] DOB: [birth year] DOA: 06/08/2020  PCP: Pcp, No  Admit date: 06/08/2020 Discharge date: 06/11/2020  Admitted From: Home Disposition: CIR  Recommendations for Outpatient Follow-up:  1. Follow up with PCP in 1 week 2. Follow up with general surgery in 3 weeks 3. Continue IV antibiotics (Vancomycin and Zosyn) until wound cultures resulted. Would treat for 7 days total (End date would be October 1) 4. Patient needs to be enrolled with [facility] and will need to use MATCH for the first 30 days for medications. CSW has a list of covered medications. Please ensure patient registers with this program on day of discharge 5. Please follow up on the following pending results: Wound culture   Discharge Condition: Stable CODE STATUS: Full code Diet recommendation: Heart healthy/carb modified   Brief/Interim Summary:  Admission HPI written by Erick Blinks, DO   Chief Complaint: R foot swelling/drainage  HPI: Ricky [name] is a 54 y.o. 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  He denies any significant pain, fevers, or chills.  Patient has been having some mild abdominal pain as well as nausea, but no vomiting otherwise noted.  He denies any new trauma or injury to his foot and states that his mother at home helps him with wound care, but it is uncertain what he actually does for his wound care.  He apparently does not take any home medications and has not been taking his diabetes medications for the last several years he claims.  He [facility], but has not been in to see his physician in over a year.  He does not monitor  blood glucose at home    Hospital course:  Severe sepsis Present on admission. Secondary to right foot wound. Patient managed empirically with Vancomycin and Zosyn on admission. Blood cultures with no growth to date. Procalcitonin is extremely high at 24.85 and trending down. Wound cultures still pending. Continue Vancomycin and Zosyn until cultures available and recommend treatment for 7 days total.  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 Patient will need close diabetes control.  Right BKA New and secondary to above. PT/OT recommendating SNF -General surgery recommendations: Stump shrinker. Staples out in 3-4 weeks with outpatient follow-up, change ACE daily  DKA Mild acidosis with associated elevated beta-hydroxybutyric acid. Normal pH on initial ABG with an associated hypocarbia compensating. Initially treated with LR bolus and IV insulin drip. Anion gap is closed and acidosis is resolved. Hyperglycemic mostly the past 24 hours. Increased to ibnsulin glargine 30 units. Basaglar on discharge.   AKI Baseline creatinine is about 1. Creatinine on admission of 2.24 in setting of DKA and dehydration. Patient has been managed with IV fluids while on insulin drip. Continues to trend down. Resolved.   Hyponatremia Initial sodium of 124 on admission likely secondary to significant hyperglycemia from above. Resolved with improvement/treatment of hyperglycemia.  Diabetes mellitus, type 2 Last hemoglobin A1C of 14 no 01/2019. Patient is managed on metformin and Lantus 20 units daily as an outpatient. Not adherent with medications as mentioned above. Patient has no insurance and no PCP. Plan is for patient to be discharged with [facility] who  will provide a PCP in addition to covering medications. Patient requires Basaglar at discharge for it to be covered by [facility] and will need to use MATCH for the first 30 days for medications.  CSW has a list of covered medications. Please ensure patient registers with this program on day of discharge  Tachycardia Likely secondary to dehydration from hyperglycemia. Improved slightly. Given Iv fluid boluses.  Hypertension Blood pressure has been uncontrolled while inpatient likely secondary to home medications being held in setting of DKA/NPO status. Patient is on lisinopril-hydrochlorothiazide as an outpatient. Continue on discharge and will add amlodipine 10 mg daily.  Hyperlipidemia Previous LDL of 153 form 2020. Patient is managed on Lipitor. LDL obtained this admission with a result of 72.  History of TBI Right sided hemiparesis Stable  Discharge Diagnoses:  Principal Problem:   Necrotizing soft tissue infection Active Problems:   Uncontrolled type 2 diabetes mellitus with complication (HCC)   Essential hypertension, benign   Hyperlipidemia   Severe sepsis (HCC)   Right foot infection   DKA (diabetic ketoacidosis) (HCC)    Discharge Instructions  Discharge Instructions    Diet - low sodium heart healthy   Complete by: As directed    Discharge wound care:   Complete by: As directed    Change ace wrap daily. Replace xeroform gauze and kerlix around leg loosely, and then ace wrap around kerlix   Increase activity slowly   Complete by: As directed      Allergies as of 06/11/2020   No Known Allergies     Medication List    TAKE these medications   amLODipine 10 MG tablet Commonly known as: NORVASC Take 1 tablet (10 mg total) by mouth daily. Start taking on: June 12, 2020   atorvastatin 20 MG tablet Commonly known as: LIPITOR Take 1 tablet (20 mg total) by mouth daily.   Basaglar KwikPen 100 UNIT/ML Inject 30 Units into the skin daily. What changed: how much to take   lisinopril-hydrochlorothiazide 10-12.5 MG tablet Commonly known as: ZESTORETIC Take 1 tablet by mouth daily.   metFORMIN 1000 MG tablet Commonly known as: GLUCOPHAGE Take  1 tablet (1,000 mg total) by mouth 2 (two) times daily with a meal.   piperacillin-tazobactam 3.375 GM/50ML IVPB Commonly known as: ZOSYN Inject 50 mLs (3.375 g total) into the vein every 8 (eight) hours.   vancomycin 1250 MG/250ML Soln Commonly known as: VANCOREADY Inject 250 mLs (1,250 mg total) into the vein daily.            Discharge Care Instructions  (From admission, onward)         Start     Ordered   06/11/20 0000  Discharge wound care:       Comments: Change ace wrap daily. Replace xeroform gauze and kerlix around leg loosely, and then ace wrap around kerlix   06/11/20 1327          Follow-up Information    Lucretia Roers, MD. Schedule an appointment as soon as possible for a visit in 3 week(s).   Specialty: General Surgery Why: Post-op follow-up Contact information: [address] [place] Ace [facility] [address] [phone]              No Known Allergies  Consultations:  Inpatient rehabilitation   Procedures/Studies: DG Foot Complete Right  Result Date: 06/08/2020 CLINICAL DATA:  Diabetic foot wound EXAM: RIGHT FOOT COMPLETE - 3+ VIEW COMPARISON:  None. FINDINGS: No acute fracture. No malalignment. No focal bony erosion  or periostitis identified. Extensive soft tissue gas within the dorsal greater than plantar aspects of the forefoot tracking to the level of the talar neck. Probable soft tissue ulceration underlying the fifth MTP joint. IMPRESSION: 1. Extensive soft tissue gas within the dorsal greater than plantar aspects of the right forefoot tracking to the level of the talar neck. Findings compatible with soft tissue infection with gas-forming organism. 2. No specific radiographic evidence of acute osteomyelitis. Electronically Signed   By: Duanne Guess D.O.   On: 06/08/2020 11:15      Subjective: Dry mouth. Urinating a lot.  Discharge Exam: Vitals:   06/11/20 0506 06/11/20 0838  BP: (!) 172/99 (!) 164/102  Pulse: (!) 103 (!) 106   Resp: 18 16  Temp: 98.9 F (37.2 C) 98.6 F (37 C)  SpO2: 93% 100%   Vitals:   06/10/20 2014 06/10/20 2050 06/11/20 0506 06/11/20 0838  BP: (!) 164/94  (!) 172/99 (!) 164/102  Pulse: (!) 113  (!) 103 (!) 106  Resp: Temp: 99.2 F (37.3 C)  98.9 F (37.2 C) 98.6 F (37 C)  TempSrc: Oral  Oral Oral  SpO2: 93% (!) 88% 93% 100%  Weight:      Height:        General exam: Appears calm and comfortable Respiratory system: Clear to auscultation. Respiratory effort normal. Cardiovascular system: S1 & S2 heard, slight tachycardia, normal rhythm. No murmurs, rubs, gallops or clicks. Gastrointestinal system: Abdomen is nondistended, soft and nontender. No organomegaly or masses felt. Normal bowel sounds heard. Central nervous system: Alert and oriented. No focal neurological deficits. Musculoskeletal: No edema. No calf tenderness. Right BKA. Skin: No cyanosis. No rashes Psychiatry: Judgement and insight appear normal. Mood & affect appropriate.    The results of significant diagnostics from this hospitalization (including imaging, microbiology, ancillary and laboratory) are listed below for reference.     Microbiology: Recent Results (from the past 240 hour(s))  Blood culture (routine x 2)     Status: None (Preliminary result)   Collection Time: 06/08/20 10:46 AM   Specimen: BLOOD RIGHT WRIST  Result Value Ref Range Status   Specimen Description BLOOD RIGHT WRIST  Final   Special Requests   Final    BOTTLES DRAWN AEROBIC AND ANAEROBIC Blood Culture adequate volume   Culture   Final    NO GROWTH 3 DAYS Performed at [facility], [address], [place], [place] [address]    Report Status PENDING  Incomplete  Blood culture (routine x 2)     Status: None (Preliminary result)   Collection Time: 06/08/20 10:47 AM   Specimen: BLOOD LEFT HAND  Result Value Ref Range Status   Specimen Description BLOOD LEFT HAND  Final   Special Requests   Final    BOTTLES DRAWN AEROBIC  AND ANAEROBIC Blood Culture adequate volume   Culture   Final    NO GROWTH 3 DAYS Performed at [facility], [address], [place], [place] [address]    Report Status PENDING  Incomplete  Respiratory Panel by RT PCR (Flu A&B, Covid) - Nasopharyngeal Swab     Status: None   Collection Time: 06/08/20 11:26 AM   Specimen: Nasopharyngeal Swab  Result Value Ref Range Status   SARS Coronavirus 2 by RT PCR NEGATIVE NEGATIVE Final    Comment: (NOTE) SARS-CoV-2 target nucleic acids are NOT DETECTED.  The SARS-CoV-2 RNA is generally detectable in upper respiratoy specimens during the acute phase of infection. The  lowest concentration of SARS-CoV-2 viral copies this assay can detect is 131 copies/mL. A negative result does not preclude SARS-Cov-2 infection and should not be used as the sole basis for treatment or other patient management decisions. A negative result may occur with  improper specimen collection/handling, submission of specimen other than nasopharyngeal swab, presence of viral mutation(s) within the areas targeted by this assay, and inadequate number of viral copies (<131 copies/mL). A negative result must be combined with clinical observations, patient history, and epidemiological information. The expected result is Negative.  Fact Sheet for Patients:  https://www.moore.com/  Fact Sheet for Healthcare Providers:  https://www.young.biz/  This test is no t yet approved or cleared by the [place] FDA and  has been authorized for detection and/or diagnosis of SARS-CoV-2 by FDA under an Emergency Use Authorization (EUA). This EUA will remain  in effect (meaning this test can be used) for the duration of the COVID-19 declaration under Section 564(b)(1) of the Act, 21 U.S.C. section 360bbb-3(b)(1), unless the authorization is terminated or revoked sooner.     Influenza A by PCR NEGATIVE NEGATIVE Final   Influenza B by PCR NEGATIVE  NEGATIVE Final    Comment: (NOTE) The Xpert Xpress SARS-CoV-2/FLU/RSV assay is intended as an aid in  the diagnosis of influenza from Nasopharyngeal swab specimens and  should not be used as a sole basis for treatment. Nasal washings and  aspirates are unacceptable for Xpert Xpress SARS-CoV-2/FLU/RSV  testing.  Fact Sheet for Patients: https://www.moore.com/  Fact Sheet for Healthcare Providers: https://www.young.biz/  This test is not yet approved or cleared by the [place] FDA and  has been authorized for detection and/or diagnosis of SARS-CoV-2 by  FDA under an Emergency Use Authorization (EUA). This EUA will remain  in effect (meaning this test can be used) for the duration of the  Covid-19 declaration under Section 564(b)(1) of the Act, 21  U.S.C. section 360bbb-3(b)(1), unless the authorization is  terminated or revoked. Performed at [facility], [address], [place], [place] [address]   Aerobic/Anaerobic Culture (surgical/deep wound)     Status: None (Preliminary result)   Collection Time: 06/08/20  2:44 PM   Specimen: Wound  Result Value Ref Range Status   Specimen Description WOUND FOOT  Final   Special Requests SAMPLE A  Final   Gram Stain   Final    NO WBC SEEN ABUNDANT GRAM POSITIVE COCCI ABUNDANT GRAM NEGATIVE RODS RARE GRAM POSITIVE RODS    Culture   Final    FEW GROUP B STREP(S.AGALACTIAE)ISOLATED TESTING AGAINST S. AGALACTIAE NOT ROUTINELY PERFORMED DUE TO PREDICTABILITY OF AMP/PEN/VAN SUSCEPTIBILITY. FEW STREPTOCOCCUS MITIS/ORALIS CULTURE REINCUBATED FOR BETTER GROWTH HOLDING FOR POSSIBLE ANAEROBE Performed at [facility], [address] [place], [place] [address]    Report Status PENDING  Incomplete  Aerobic/Anaerobic Culture (surgical/deep wound)     Status: None (Preliminary result)   Collection Time: 06/08/20  4:50 PM   Specimen: Tissue  Result Value Ref Range Status   Specimen Description  TISSUE RIGHT FOOT  Final   Special Requests SAMPLE B  Final   Gram Stain   Final    NO WBC SEEN ABUNDANT GRAM POSITIVE COCCI ABUNDANT GRAM NEGATIVE RODS MODERATE GRAM POSITIVE RODS Performed at [facility], [address] [place], [place] [address]    Culture   Final    RARE GROUP B STREP(S.AGALACTIAE)ISOLATED TESTING AGAINST S. AGALACTIAE NOT ROUTINELY PERFORMED DUE TO PREDICTABILITY OF AMP/PEN/VAN SUSCEPTIBILITY. CULTURE REINCUBATED FOR BETTER GROWTH  NO ANAEROBES ISOLATED; CULTURE IN PROGRESS FOR 5 DAYS    Report Status PENDING  Incomplete  MRSA PCR Screening     Status: None   Collection Time: 06/08/20  6:31 PM   Specimen: Nasal Mucosa; Nasopharyngeal  Result Value Ref Range Status   MRSA by PCR NEGATIVE NEGATIVE Final    Comment:        The GeneXpert MRSA Assay (FDA approved for NASAL specimens only), is one component of a comprehensive MRSA colonization surveillance program. It is not intended to diagnose MRSA infection nor to guide or monitor treatment for MRSA infections. Performed at [facility], [address], [place], [place] [address]      Labs: BNP (last 3 results) No results for input(s): BNP in the last 8760 hours. Basic Metabolic Panel: Recent Labs  Lab 06/08/20 2005 06/09/20 0056 06/09/20 0517 06/10/20 0413 06/11/20 0451  NA 135 134* 133* 136 137  K 3.7 3.5 3.4* 3.2* 3.2*  CL 99 100 101 101 101  CO2 23 24 23 24 25   GLUCOSE 248* 249* 291* 252* 294*  BUN 31* 27* 24* 13 10  CREATININE 1.83* 1.60* 1.53* 1.27* 1.22  CALCIUM 8.0* 7.9* 7.9* 7.9* 8.2*  MG  --   --  2.2 2.1  --    Liver Function Tests: Recent Labs  Lab 06/08/20 1036 06/09/20 0517  AST 17 26  ALT 21 17  ALKPHOS 90 83  BILITOT 1.5* 0.7  PROT 6.7 6.4*  ALBUMIN 2.3* 2.0*   No results for input(s): LIPASE, AMYLASE in the last 168 hours. No results for input(s): AMMONIA in the last 168 hours. CBC: Recent Labs  Lab 06/08/20 1036 06/09/20 0517 06/10/20 0413  06/11/20 0452  WBC 19.9* 15.2* 13.7* 11.7*  NEUTROABS 16.7*  --   --   --   HGB 12.8* 11.0* 10.8* 11.3*  HCT 39.4 33.7* 33.8* 34.7*  MCV 81.6 81.6 82.6 80.9  PLT 392 360 379 450*   Cardiac Enzymes: No results for input(s): CKTOTAL, CKMB, CKMBINDEX, TROPONINI in the last 168 hours. BNP: Invalid input(s): POCBNP CBG: Recent Labs  Lab 06/10/20 1221 06/10/20 1714 06/10/20 2011 06/11/20 0755 06/11/20 1240  GLUCAP 327* 255* 284* 295* 347*   D-Dimer No results for input(s): DDIMER in the last 72 hours. Hgb A1c Recent Labs    06/10/20 0413  HGBA1C 14.5*   Lipid Profile No results for input(s): CHOL, HDL, LDLCALC, TRIG, CHOLHDL, LDLDIRECT in the last 72 hours. Thyroid function studies No results for input(s): TSH, T4TOTAL, T3FREE, THYROIDAB in the last 72 hours.  Invalid input(s): FREET3 Anemia work up No results for input(s): VITAMINB12, FOLATE, FERRITIN, TIBC, IRON, RETICCTPCT in the last 72 hours. Urinalysis    Component Value Date/Time   COLORURINE YELLOW 06/08/2020 1058   APPEARANCEUR HAZY (A) 06/08/2020 1058   LABSPEC 1.020 06/08/2020 1058   PHURINE 5.0 06/08/2020 1058   GLUCOSEU >=500 (A) 06/08/2020 1058   HGBUR MODERATE (A) 06/08/2020 1058   BILIRUBINUR NEGATIVE 06/08/2020 1058   KETONESUR 20 (A) 06/08/2020 1058   PROTEINUR 30 (A) 06/08/2020 1058   UROBILINOGEN 0.2 09/29/2012 0905   NITRITE NEGATIVE 06/08/2020 1058   LEUKOCYTESUR NEGATIVE 06/08/2020 1058   Sepsis Labs Invalid input(s): PROCALCITONIN,  WBC,  LACTICIDVEN Microbiology Recent Results (from the past 240 hour(s))  Blood culture (routine x 2)     Status: None (Preliminary result)   Collection Time: 06/08/20 10:46 AM   Specimen: BLOOD RIGHT WRIST  Result Value Ref Range Status   Specimen  Description BLOOD RIGHT WRIST  Final   Special Requests   Final    BOTTLES DRAWN AEROBIC AND ANAEROBIC Blood Culture adequate volume   Culture   Final    NO GROWTH 3 DAYS Performed at [facility],  [address] [place], [place] [address]    Report Status PENDING  Incomplete  Blood culture (routine x 2)     Status: None (Preliminary result)   Collection Time: 06/08/20 10:47 AM   Specimen: BLOOD LEFT HAND  Result Value Ref Range Status   Specimen Description BLOOD LEFT HAND  Final   Special Requests   Final    BOTTLES DRAWN AEROBIC AND ANAEROBIC Blood Culture adequate volume   Culture   Final    NO GROWTH 3 DAYS Performed at [facility], [address], [place], [place] [address]    Report Status PENDING  Incomplete  Respiratory Panel by RT PCR (Flu A&B, Covid) - Nasopharyngeal Swab     Status: None   Collection Time: 06/08/20 11:26 AM   Specimen: Nasopharyngeal Swab  Result Value Ref Range Status   SARS Coronavirus 2 by RT PCR NEGATIVE NEGATIVE Final    Comment: (NOTE) SARS-CoV-2 target nucleic acids are NOT DETECTED.  The SARS-CoV-2 RNA is generally detectable in upper respiratoy specimens during the acute phase of infection. The lowest concentration of SARS-CoV-2 viral copies this assay can detect is 131 copies/mL. A negative result does not preclude SARS-Cov-2 infection and should not be used as the sole basis for treatment or other patient management decisions. A negative result may occur with  improper specimen collection/handling, submission of specimen other than nasopharyngeal swab, presence of viral mutation(s) within the areas targeted by this assay, and inadequate number of viral copies (<131 copies/mL). A negative result must be combined with clinical observations, patient history, and epidemiological information. The expected result is Negative.  Fact Sheet for Patients:  https://www.moore.com/  Fact Sheet for Healthcare Providers:  https://www.young.biz/  This test is no t yet approved or cleared by the [place] FDA and  has been authorized for detection and/or diagnosis of SARS-CoV-2 by FDA under an Emergency Use  Authorization (EUA). This EUA will remain  in effect (meaning this test can be used) for the duration of the COVID-19 declaration under Section 564(b)(1) of the Act, 21 U.S.C. section 360bbb-3(b)(1), unless the authorization is terminated or revoked sooner.     Influenza A by PCR NEGATIVE NEGATIVE Final   Influenza B by PCR NEGATIVE NEGATIVE Final    Comment: (NOTE) The Xpert Xpress SARS-CoV-2/FLU/RSV assay is intended as an aid in  the diagnosis of influenza from Nasopharyngeal swab specimens and  should not be used as a sole basis for treatment. Nasal washings and  aspirates are unacceptable for Xpert Xpress SARS-CoV-2/FLU/RSV  testing.  Fact Sheet for Patients: https://www.moore.com/  Fact Sheet for Healthcare Providers: https://www.young.biz/  This test is not yet approved or cleared by the [place] FDA and  has been authorized for detection and/or diagnosis of SARS-CoV-2 by  FDA under an Emergency Use Authorization (EUA). This EUA will remain  in effect (meaning this test can be used) for the duration of the  Covid-19 declaration under Section 564(b)(1) of the Act, 21  U.S.C. section 360bbb-3(b)(1), unless the authorization is  terminated or revoked. Performed at [facility], [address] [place], [place] [address]   Aerobic/Anaerobic Culture (surgical/deep wound)     Status: None (Preliminary result)   Collection Time: 06/08/20  2:44 PM  Specimen: Wound  Result Value Ref Range Status   Specimen Description WOUND FOOT  Final   Special Requests SAMPLE A  Final   Gram Stain   Final    NO WBC SEEN ABUNDANT GRAM POSITIVE COCCI ABUNDANT GRAM NEGATIVE RODS RARE GRAM POSITIVE RODS    Culture   Final    FEW GROUP B STREP(S.AGALACTIAE)ISOLATED TESTING AGAINST S. AGALACTIAE NOT ROUTINELY PERFORMED DUE TO PREDICTABILITY OF AMP/PEN/VAN SUSCEPTIBILITY. FEW STREPTOCOCCUS MITIS/ORALIS CULTURE REINCUBATED FOR BETTER GROWTH HOLDING  FOR POSSIBLE ANAEROBE Performed at [facility], [address] [place], [place] [address]    Report Status PENDING  Incomplete  Aerobic/Anaerobic Culture (surgical/deep wound)     Status: None (Preliminary result)   Collection Time: 06/08/20  4:50 PM   Specimen: Tissue  Result Value Ref Range Status   Specimen Description TISSUE RIGHT FOOT  Final   Special Requests SAMPLE B  Final   Gram Stain   Final    NO WBC SEEN ABUNDANT GRAM POSITIVE COCCI ABUNDANT GRAM NEGATIVE RODS MODERATE GRAM POSITIVE RODS Performed at [facility], [address] [place], [place] [address]    Culture   Final    RARE GROUP B STREP(S.AGALACTIAE)ISOLATED TESTING AGAINST S. AGALACTIAE NOT ROUTINELY PERFORMED DUE TO PREDICTABILITY OF AMP/PEN/VAN SUSCEPTIBILITY. CULTURE REINCUBATED FOR BETTER GROWTH NO ANAEROBES ISOLATED; CULTURE IN PROGRESS FOR 5 DAYS    Report Status PENDING  Incomplete  MRSA PCR Screening     Status: None   Collection Time: 06/08/20  6:31 PM   Specimen: Nasal Mucosa; Nasopharyngeal  Result Value Ref Range Status   MRSA by PCR NEGATIVE NEGATIVE Final    Comment:        The GeneXpert MRSA Assay (FDA approved for NASAL specimens only), is one component of a comprehensive MRSA colonization surveillance program. It is not intended to diagnose MRSA infection nor to guide or monitor treatment for MRSA infections. Performed at [facility], [address] [place], [place] [address]      Time coordinating discharge: 35 minutes  SIGNED:   Jacquelin Hawkingalph Ryley Teater, MD [facility] 06/11/2020, 1:27 PM

## 2020-06-11 NOTE — Progress Notes (Signed)
I was present with the medical student for this service. I personally verified the history of present illness, performed the physical exam, and made the plan for this encounter. I have verified the medical student's documentation and made modifications where appropriately. I have personally documented in my own words a brief history, physical, and plan below.     Doing well and stump healthy.  Will need staples out in 3-4 weeks. Patient going to rehab hopefully. Stump shrinker ordered. Can stop antibiotics if blood cultures negative.   Algis Greenhouse, MD [facility] [address] Vella R[place], [place] [address] [phone] (office)    [facility] Progress Note  3 Days Post-Op  Subjective: Patient reports doing well, with no pain. Tolerating p.o., with good water intake. Followed by PT and OT, with planned rehab.   Objective: Vital signs in last 24 hours: Temp:  [98.6 F (37 C)-99.2 F (37.3 C)] 98.6 F (37 C) (10/29 0838) Pulse Rate:  [98-113] 106 (10/29 0838) Resp:  [16-18] 16 (10/29 0838) BP: (164-172)/(80-102) 164/102 (10/29 0838) SpO2:  [88 %-100 %] 100 % (10/29 0838) Last BM Date: 06/11/20  Intake/Output from previous day: 10/28 0701 - 10/29 0700 In: 210.7 [IV Piggyback:210.7] Out: 2500 [Urine:2500] Intake/Output this shift: No intake/output data recorded.  General appearance: alert, cooperative and no distress Head: Normocephalic, without obvious abnormality, atraumatic Resp: normal work of breathing  Stump dressing changed and staples c/d/i with out bleeding, kerlix and ace reapplied   Lab Results:  Recent Labs    06/10/20 0413 06/11/20 0452  WBC 13.7* 11.7*  HGB 10.8* 11.3*  HCT 33.8* 34.7*  PLT 379 450*   BMET Recent Labs    06/10/20 0413 06/11/20 0451  NA 136 137  K 3.2* 3.2*  CL 101 101  CO2 24 25  GLUCOSE 252* 294*  BUN 13 10  CREATININE 1.27* 1.22  CALCIUM 7.9* 8.2*   PT/INR No results  for input(s): LABPROT, INR in the last 72 hours.  Studies/Results: No results found.  Anti-infectives: Anti-infectives (From admission, onward)   Start     Dose/Rate Route Frequency Ordered Stop   06/09/20 1400  vancomycin (VANCOREADY) IVPB 1250 mg/250 mL        1,250 mg 166.7 mL/hr over 90 Minutes Intravenous Every 24 hours 06/09/20 0841     06/09/20 1300  vancomycin (VANCOREADY) IVPB 750 mg/150 mL  Status:  Discontinued        750 mg 150 mL/hr over 60 Minutes Intravenous Every 24 hours 06/08/20 1239 06/09/20 0841   06/08/20 1800  piperacillin-tazobactam (ZOSYN) IVPB 3.375 g        3.375 g 12.5 mL/hr over 240 Minutes Intravenous Every 8 hours 06/08/20 1414     06/08/20 1215  vancomycin (VANCOREADY) IVPB 1750 mg/350 mL        1,750 mg 175 mL/hr over 120 Minutes Intravenous  Once 06/08/20 1206 06/08/20 1407   06/08/20 1030  piperacillin-tazobactam (ZOSYN) IVPB 3.375 g        3.375 g 100 mL/hr over 30 Minutes Intravenous  Once 06/08/20 1020 06/08/20 1255      Assessment/Plan: s/p Procedure(s): AMPUTATION BELOW KNEE RIGHT LEG  Patient POD 3 for 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 Patient doing well, and remains arfebrile with falling WBC and procalcionin and improved renal function. Elevated platelets likely reactive, secondary to resolving infection. Followed by PT and OT.   Neuro: Pain well controled at present. PRN analgesic Endo: Continue Insulin therapy  for DM ID: Continue Zosyn and Vancomycin. Daily dressing changes.     LOS: 3 days    Greer Ee 06/11/2020
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
## 2020-06-11 NOTE — Progress Notes (Signed)
Pt arrived via stretcher with [facility] from [facility]. He is awake, alert, oriented x 3. Incontinent of bowel and bladder en route. Incontinent care provided. Ace wrap noted to R stump from BKA done yesterday. Oriented patient to room and proper use of call bell. Verbalizes full understanding.
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
## 2020-06-11 NOTE — Progress Notes (Signed)
Inpatient Rehabilitation Medication Review by a Pharmacist  A complete drug regimen review was completed for this patient to identify any potential clinically significant medication issues.  Clinically significant medication issues were identified:  yes   Type of Medication Issue Identified Description of Issue Urgent (address now) Non-Urgent (address on AM team rounds) Plan Plan Accepted by Provider? (Yes / No / Pending AM Rounds)  Drug Interaction(s) (clinically significant)       Duplicate Therapy       Allergy       No Medication Administration End Date       Incorrect Dose       Additional Drug Therapy Needed  Dr. Caleb Popp recommended to resume Metformin on discharge Non-urgent MD monitoring. Resume when appropriate.   Other  On Vancomycin , no pharmacy consult ordered.   Non-urgent Okay to continue current Vancomycin dose tonight and discuss with MD in AM 06/12/20.      For non-urgent medication issues to be resolved on team rounds tomorrow morning or pharmacist with contact MD /PA in AM 06/12/20.  Pharmacist comments:  Vancomycin trough drawn at [facility] @ 13:37 10/29  = 4 mcg/ml , on 1250 mg IV q24h, after 2 doses(10/27-10/28),  and a load of 1750mg  on 10/26, then 1 hour later got 1750mg  in OR.  Vanc trough of 4 mcg/ml unexpectedly low if he received 3500 mg on 10/26 (2 doses of 1750mg  given) with his age , SCr.  Pharmacist will  follow up with MD tomorrow AM.   to continue current Vancomycin dose tonight and discuss with MD in AM 06/12/20. Please consider ordering a pharmacy consult for Vancomycin for monitoring and dose adjustments.   Time spent performing this drug regimen review (minutes):  15   , Molli Knock Clinical Pharmacist 06/11/2020 6:05 PM
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
## 2020-06-11 NOTE — Progress Notes (Signed)
Incontinent care provided. Pt with small loose stool. He is using urinal independently but could benefit from some assistance to ensure proper use and urine to be going into bottle. Pt instructed to call for assist. All admission questions done at this time. Vancomycin infusing. Zosyn pending administration.

## 2020-06-11 NOTE — Progress Notes (Signed)
Patient pulling off telemetry multiple times through shift explained importance of cardiac monitoring

## 2020-06-11 NOTE — Progress Notes (Signed)
Physical Therapy Treatment Patient Details Name: Ricky [name] MRN: [id number] DOB: 1965/12/22 Today's Date: 06/11/2020    History of Present Illness Colter Magowan is a 54 y.o. male with medical history significant for prior TBI from [facility] and right-sided hemiparesis, hypertension, dyslipidemia, type 2 diabetes, and general medical noncompliance. Patient presented secondary to right foot swelling/drainage and found to have concern for diabetic foot wound with evidence of necrotizing fasciitis. Patient was also found to have evidence of DKA. 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    PT Comments    Patient demonstrates increased endurance/distance for gait training with fair/good return for step-to pattern using RW, occasional stumbling without loss of balance, followed with w/c for safety, had to take sitting rest break before walking back to bedside and tolerated sitting up in chair after therapy - nursing staff notified.  Patient will benefit from continued physical therapy in hospital and recommended venue below to increase strength, balance, endurance for safe ADLs and gait.    Follow Up Recommendations  CIR     Equipment Recommendations  None recommended by PT    Recommendations for Other Services       Precautions / Restrictions Precautions Precautions: Fall Restrictions Weight Bearing Restrictions: Yes RLE Weight Bearing: Non weight bearing    Mobility  Bed Mobility Overal bed mobility: Needs Assistance Bed Mobility: Supine to Sit     Supine to sit: Min guard     General bed mobility comments: increased time to come to EOB  Transfers Overall transfer level: Needs assistance Equipment used: Rolling walker (2 wheeled) Transfers: Sit to/from [employer] Sit to Stand: Min assist;Mod assist Stand pivot transfers: Mod assist       General transfer comment: slow labored movement with mostly shuffling on LLE  during transfers  Ambulation/Gait Ambulation/Gait assistance: Mod assist Gait Distance (Feet): 20 Feet Assistive device: Rolling walker (2 wheeled) Gait Pattern/deviations: Decreased step length - left;Decreased stride length;Step-to pattern Gait velocity: decreased   General Gait Details: increased endurance/distance for taking steps with good return for step-to pattern using RW, occasional stumbling without loss of balance, followed with w/c for safety, limited secondary to fatigue   Stairs             Wheelchair Mobility    Modified Rankin (Stroke Patients Only)       Balance Overall balance assessment: Needs assistance Sitting-balance support: Feet supported;No upper extremity supported Sitting balance-Leahy Scale: Fair Sitting balance - Comments: fair/good seated at EOB   Standing balance support: During functional activity;Bilateral upper extremity supported Standing balance-Leahy Scale: Fair Standing balance comment: using RW                            Cognition Arousal/Alertness: Awake/alert Behavior During Therapy: WFL for tasks assessed/performed Overall Cognitive Status: Within Functional Limits for tasks assessed                                        Exercises General Exercises - Lower Extremity Ankle Circles/Pumps: Seated;AROM;Strengthening;Right;15 reps Long Arc Quad: Seated;AROM;Strengthening;Both;10 reps Hip Flexion/Marching: Seated;AROM;Strengthening;Both;10 reps    General Comments        Pertinent Vitals/Pain Pain Assessment: 0-10 Pain Score: 6  Pain Location: RLE Pain Descriptors / Indicators: Aching;Sore;Throbbing Pain Intervention(s): Limited activity within patient's tolerance;Monitored during session    Home Living  Family/patient expects to be discharged to:: Inpatient rehab Living Arrangements: Parent (mother-79 years old) Available Help at Discharge: Family;Available 24 hours/day Type of Home:  Mobile home Home Access: Stairs to enter Entrance Stairs-Rails: Right;Left;Can reach both Home Layout: One level Home Equipment: Walker - 2 wheels;Cane - single point;Wheelchair - manual;Shower seat      Prior Function Level of Independence: Independent      Comments: independent in ADLs, drives, assists mother with housework   PT Goals (current goals can now be found in the care plan section) Acute Rehab PT Goals Patient Stated Goal: return home after rehab PT Goal Formulation: With patient Time For Goal Achievement: 06/23/20 Potential to Achieve Goals: Good Progress towards PT goals: Progressing toward goals    Frequency    Min 3X/week      PT Plan Current plan remains appropriate    Co-evaluation PT/OT/SLP Co-Evaluation/Treatment: Yes Reason for Co-Treatment: Complexity of the patient's impairments (multi-system involvement) PT goals addressed during session: Mobility/safety with mobility;Balance;Proper use of DME;Strengthening/ROM OT goals addressed during session: ADL's and self-care;Proper use of Adaptive equipment and DME      AM-PAC PT "6 Clicks" Mobility   Outcome Measure  Help needed turning from your back to your side while in a flat bed without using bedrails?: None Help needed moving from lying on your back to sitting on the side of a flat bed without using bedrails?: A Little Help needed moving to and from a bed to a chair (including a wheelchair)?: A Lot Help needed standing up from a chair using your arms (e.g., wheelchair or bedside chair)?: A Lot Help needed to walk in hospital room?: A Lot Help needed climbing 3-5 steps with a railing? : Total 6 Click Score: 14    End of Session   Activity Tolerance: Patient tolerated treatment well;Patient limited by fatigue Patient left: in chair;with call bell/phone within reach;with chair alarm set Nurse Communication: Mobility status PT Visit Diagnosis: Unsteadiness on feet (R26.81);Other abnormalities of  gait and mobility (R26.89);Muscle weakness (generalized) (M62.81)     Time: 0814-4818 PT Time Calculation (min) (ACUTE ONLY): 33 min  Charges:  $Gait Training: 8-22 mins $Therapeutic Exercise: 8-22 mins                     10:34 AM, 06/11/20 Ocie Bob, MPT Physical Therapist with [facility] [phone] office [phone] mobile phone

## 2020-06-11 NOTE — Progress Notes (Signed)
Rehab Admissions Coordinator Note:  Patient was screened by Clois Dupes for appropriateness for an Inpatient Acute Rehab Consult per therapy change in recommendations.   At this time, we are recommending Inpatient Rehab consult. I will place order per protocol.  Clois Dupes MSN 06/11/2020, 9:07 AM  I can be reached at [phone].
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
## 2020-06-11 NOTE — Plan of Care (Signed)
  Problem: Acute Rehab OT Goals (only OT should resolve) Goal: Pt. Will Perform Upper Body Dressing Flowsheets (Taken 06/11/2020 0828) Pt Will Perform Upper Body Dressing:  with modified independence  sitting Goal: Pt. Will Perform Lower Body Dressing Flowsheets (Taken 06/11/2020 0828) Pt Will Perform Lower Body Dressing:  with min assist  sitting/lateral leans  sit to/from stand Goal: Pt. Will Transfer To Toilet Flowsheets (Taken 06/11/2020 [phone]) Pt Will Transfer to Toilet:  with min guard assist  stand pivot transfer  ambulating  regular height toilet  bedside commode Goal: Pt. Will Perform Toileting-Clothing Manipulation Flowsheets (Taken 06/11/2020 0828) Pt Will Perform Toileting - Clothing Manipulation and hygiene:  with min guard assist  sitting/lateral leans  sit to/from stand Goal: Pt/Caregiver Will Perform Home Exercise Program Flowsheets (Taken 06/11/2020 [phone]) Pt/caregiver will Perform Home Exercise Program:  Increased strength  Both right and left upper extremity  Independently  With written HEP provided

## 2020-06-11 NOTE — Progress Notes (Signed)
Inpatient Rehabilitation-Admissions Coordinator   Contacted [facility] for pick up and transport. [facility] estimated time 1:15.   Please call if questions.   Cheri Rous, OTR/L  Rehab Admissions Coordinator  [phone] 06/11/2020 12:53 PM

## 2020-06-12 ENCOUNTER — Inpatient Hospital Stay (HOSPITAL_COMMUNITY): Payer: Self-pay

## 2020-06-12 ENCOUNTER — Inpatient Hospital Stay (HOSPITAL_COMMUNITY): Payer: Self-pay | Admitting: Occupational Therapy

## 2020-06-12 DIAGNOSIS — I10 Essential (primary) hypertension: Secondary | ICD-10-CM

## 2020-06-12 DIAGNOSIS — I739 Peripheral vascular disease, unspecified: Secondary | ICD-10-CM

## 2020-06-12 DIAGNOSIS — E119 Type 2 diabetes mellitus without complications: Secondary | ICD-10-CM

## 2020-06-12 DIAGNOSIS — Z89511 Acquired absence of right leg below knee: Secondary | ICD-10-CM

## 2020-06-12 LAB — GLUCOSE, CAPILLARY
Glucose-Capillary: 330 mg/dL — ABNORMAL HIGH (ref 70–99)
Glucose-Capillary: 381 mg/dL — ABNORMAL HIGH (ref 70–99)
Glucose-Capillary: 213 mg/dL — ABNORMAL HIGH (ref 70–99)
Glucose-Capillary: 216 mg/dL — ABNORMAL HIGH (ref 70–99)

## 2020-06-12 LAB — AEROBIC/ANAEROBIC CULTURE W GRAM STAIN (SURGICAL/DEEP WOUND): Gram Stain: NONE SEEN

## 2020-06-12 LAB — VANCOMYCIN, RANDOM: Vancomycin Rm: 13

## 2020-06-12 MED ORDER — VANCOMYCIN HCL 1250 MG/250ML IV SOLN
1250.0000 mg | Freq: Two times a day (BID) | INTRAVENOUS | Status: AC
  Administered 2020-06-12 – 2020-06-14 (×6): 1250 mg via INTRAVENOUS
  Filled 2020-06-12 (×7): qty 250
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
## 2020-06-12 NOTE — Progress Notes (Signed)
Inpatient Rehabilitation Medication Review by a Pharmacist   A complete drug regimen review was completed for this patient to identify any potential clinically significant medication issues.   Clinically significant medication issues were identified:  yes     Type of Medication Issue Identified Description of Issue Urgent (address now) Non-Urgent (address on AM team rounds) Plan Plan Accepted by Provider? (Yes / No / Pending AM Rounds)  Drug Interaction(s) (clinically significant)            Duplicate Therapy            Allergy            No Medication Administration End Date            Incorrect Dose            Additional Drug Therapy Needed   Dr. Caleb Popp recommended to resume Metformin on discharge Non-urgent MD monitoring. Resume when appropriate.    Other   On Vancomycin , no pharmacy consult ordered.   Non-urgent Okay to continue current Vancomycin dose tonight and discuss with MD in AM 06/12/20.  Vancomycin consult ordered 06/12/20 and dose adjusted as appropriate.       Pharmacist comments:  Vancomycin random subtherapeutic--adjusted dosing interval to target goal trough of 15-20. Will follow up duration of therapy. Will continue to evaluate metformin restart as appropriate.    Time spent performing this drug regimen review (minutes):  15  Pervis Hocking, PharmD PGY1 Pharmacy Resident 06/12/2020 8:41 AM  Please check [email or web address] for unit-specific pharmacy phone numbers.

## 2020-06-12 NOTE — Progress Notes (Signed)
Pharmacy Antibiotic Note  Ricky [name] is a 54 y.o. male who presented to [facility] on 06/08/20 with wound infection now s/p BKA.  Pharmacy has been consulted for vancomycin dosing.  Pt currently receiving vancomycin 1250 mg IV q12h. Vanc random this morning (11.5 hours after last dose) was 13. Scr continues to improve. Will increase dosing frequency to target goal trough 15-20. Per surgery, can stop antibiotics if blood cultures negative, which are NG x4 days today.   Plan: Vancomycin 1250 mg IV q12h Zosyn 3.375 mg IV q8h Monitor clinical improvement, Scr, cultures Follow up on planned duration of therapy  Height: 5\' 6"  (167.6 cm) Weight: 71.9 kg (158 lb 8.2 oz) IBW/kg (Calculated) : 63.8  Temp (24hrs), Avg:98.8 F (37.1 C), Min:98 F (36.7 C), Max:99.3 F (37.4 C)  Recent Labs  Lab 06/08/20 1036 06/08/20 1229 06/08/20 2005 06/09/20 0056 06/09/20 0517 06/10/20 0413 06/11/20 0451 06/11/20 0452 06/11/20 1337 06/11/20 1804 06/12/20 0503  WBC 19.9*  --   --   --  15.2* 13.7*  --  11.7*  --  12.1*  --   CREATININE 2.24*  --    < > 1.60* 1.53* 1.27* 1.22  --   --  1.18  --   LATICACIDVEN 1.8 1.5  --   --  1.6  --   --   --   --   --   --   VANCOTROUGH  --   --   --   --   --   --   --   --  4*  --   --   VANCORANDOM  --   --   --   --   --   --   --   --   --   --  13   < > = values in this interval not displayed.    Estimated Creatinine Clearance: 64.6 mL/min (by C-G formula based on SCr of 1.18 mg/dL).    No Known Allergies  Antimicrobials this admission: Vancomycin 10/26 >> Zosyn 10/26  >>  Microbiology results: 10/26 BCx x2: NG x4 days 10/26 Wound Cx: mod GPR, abundant GPC/GNR Rare Group B Strep (S. Agalatiae)  Thank you for allowing pharmacy to be a part of this patient's care.  11/26, PharmD PGY1 Pharmacy Resident 06/12/2020 8:30 AM  Please check [email or web address] for unit-specific pharmacy phone numbers.
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
## 2020-06-13 DIAGNOSIS — G546 Phantom limb syndrome with pain: Secondary | ICD-10-CM

## 2020-06-13 LAB — GLUCOSE, CAPILLARY
Glucose-Capillary: 229 mg/dL — ABNORMAL HIGH (ref 70–99)
Glucose-Capillary: 331 mg/dL — ABNORMAL HIGH (ref 70–99)
Glucose-Capillary: 261 mg/dL — ABNORMAL HIGH (ref 70–99)

## 2020-06-13 LAB — CULTURE, BLOOD (ROUTINE X 2)
Culture: NO GROWTH
Culture: NO GROWTH
Special Requests: ADEQUATE
Special Requests: ADEQUATE

## 2020-06-13 NOTE — Progress Notes (Signed)
Patient reported onset of phantom pain in right great toe. PRN oxycodone given.   Patient is experiencing episodic bowel incontinence when urinating. Excoriated area noted in scrotal area. Barrier cream at bedside, will notify oncoming staff.
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
## 2020-06-14 ENCOUNTER — Inpatient Hospital Stay (HOSPITAL_COMMUNITY): Payer: Self-pay | Admitting: Physical Therapy

## 2020-06-14 ENCOUNTER — Inpatient Hospital Stay (HOSPITAL_COMMUNITY): Payer: Self-pay | Admitting: Occupational Therapy

## 2020-06-14 ENCOUNTER — Inpatient Hospital Stay (HOSPITAL_COMMUNITY): Payer: Self-pay

## 2020-06-14 DIAGNOSIS — N179 Acute kidney failure, unspecified: Secondary | ICD-10-CM

## 2020-06-14 DIAGNOSIS — G8918 Other acute postprocedural pain: Secondary | ICD-10-CM

## 2020-06-14 DIAGNOSIS — I10 Essential (primary) hypertension: Secondary | ICD-10-CM

## 2020-06-14 DIAGNOSIS — E876 Hypokalemia: Secondary | ICD-10-CM

## 2020-06-14 DIAGNOSIS — E1142 Type 2 diabetes mellitus with diabetic polyneuropathy: Secondary | ICD-10-CM

## 2020-06-14 DIAGNOSIS — D638 Anemia in other chronic diseases classified elsewhere: Secondary | ICD-10-CM

## 2020-06-14 LAB — AEROBIC/ANAEROBIC CULTURE W GRAM STAIN (SURGICAL/DEEP WOUND): Gram Stain: NONE SEEN

## 2020-06-14 LAB — COMPREHENSIVE METABOLIC PANEL
ALT: 35 U/L (ref 0–44)
AST: 40 U/L (ref 15–41)
Albumin: 2.1 g/dL — ABNORMAL LOW (ref 3.5–5.0)
Alkaline Phosphatase: 102 U/L (ref 38–126)
Anion gap: 10 (ref 5–15)
BUN: 9 mg/dL (ref 6–20)
CO2: 28 mmol/L (ref 22–32)
Calcium: 8.5 mg/dL — ABNORMAL LOW (ref 8.9–10.3)
Chloride: 101 mmol/L (ref 98–111)
Creatinine, Ser: 1.45 mg/dL — ABNORMAL HIGH (ref 0.61–1.24)
GFR, Estimated: 57 mL/min — ABNORMAL LOW (ref >60)
Glucose, Bld: 275 mg/dL — ABNORMAL HIGH (ref 70–99)
Potassium: 3 mmol/L — ABNORMAL LOW (ref 3.5–5.1)
Sodium: 139 mmol/L (ref 135–145)
Total Bilirubin: 0.6 mg/dL (ref 0.3–1.2)
Total Protein: 7.5 g/dL (ref 6.5–8.1)

## 2020-06-14 LAB — CBC WITH DIFFERENTIAL/PLATELET
Abs Immature Granulocytes: 0.32 10*3/uL — ABNORMAL HIGH (ref 0.00–0.07)
Basophils Absolute: 0.1 10*3/uL (ref 0.0–0.1)
Basophils Relative: 0 %
Eosinophils Absolute: 0.4 10*3/uL (ref 0.0–0.5)
Eosinophils Relative: 3 %
HCT: 37.2 % — ABNORMAL LOW (ref 39.0–52.0)
Hemoglobin: 11.8 g/dL — ABNORMAL LOW (ref 13.0–17.0)
Immature Granulocytes: 3 %
Lymphocytes Relative: 14 %
Lymphs Abs: 1.7 10*3/uL (ref 0.7–4.0)
MCH: 25.7 pg — ABNORMAL LOW (ref 26.0–34.0)
MCHC: 31.7 g/dL (ref 30.0–36.0)
MCV: 81 fL (ref 80.0–100.0)
Monocytes Absolute: 1.1 10*3/uL — ABNORMAL HIGH (ref 0.1–1.0)
Monocytes Relative: 9 %
Neutro Abs: 8.4 10*3/uL — ABNORMAL HIGH (ref 1.7–7.7)
Neutrophils Relative %: 71 %
Platelets: 662 10*3/uL — ABNORMAL HIGH (ref 150–400)
RBC: 4.59 MIL/uL (ref 4.22–5.81)
RDW: 13.7 % (ref 11.5–15.5)
WBC: 11.9 10*3/uL — ABNORMAL HIGH (ref 4.0–10.5)
nRBC: 0 % (ref 0.0–0.2)

## 2020-06-14 LAB — GLUCOSE, CAPILLARY
Glucose-Capillary: 235 mg/dL — ABNORMAL HIGH (ref 70–99)
Glucose-Capillary: 224 mg/dL — ABNORMAL HIGH (ref 70–99)
Glucose-Capillary: 300 mg/dL — ABNORMAL HIGH (ref 70–99)
Glucose-Capillary: 173 mg/dL — ABNORMAL HIGH (ref 70–99)
Glucose-Capillary: 230 mg/dL — ABNORMAL HIGH (ref 70–99)

## 2020-06-14 MED ORDER — LISINOPRIL 20 MG PO TABS
20.0000 mg | ORAL_TABLET | Freq: Every day | ORAL | Status: DC
  Administered 2020-06-15 – 2020-06-24 (×10): 20 mg via ORAL
  Filled 2020-06-14 (×10): qty 1

## 2020-06-14 MED ORDER — LISINOPRIL 10 MG PO TABS
10.0000 mg | ORAL_TABLET | Freq: Once | ORAL | Status: AC
  Administered 2020-06-14: 10 mg via ORAL
  Filled 2020-06-14: qty 1

## 2020-06-14 MED ORDER — INSULIN GLARGINE 100 UNIT/ML ~~LOC~~ SOLN
10.0000 [IU] | Freq: Every day | SUBCUTANEOUS | Status: DC
  Administered 2020-06-14 – 2020-06-15 (×2): 10 [IU] via SUBCUTANEOUS
  Filled 2020-06-14 (×3): qty 0.1

## 2020-06-14 MED ORDER — POTASSIUM CHLORIDE CRYS ER 20 MEQ PO TBCR
30.0000 meq | EXTENDED_RELEASE_TABLET | Freq: Two times a day (BID) | ORAL | Status: AC
  Administered 2020-06-14 (×2): 30 meq via ORAL
  Filled 2020-06-14 (×3): qty 1

## 2020-06-14 MED ORDER — HYDROCHLOROTHIAZIDE 12.5 MG PO CAPS
12.5000 mg | ORAL_CAPSULE | Freq: Every day | ORAL | Status: DC
  Administered 2020-06-15 – 2020-06-24 (×10): 12.5 mg via ORAL
  Filled 2020-06-14 (×10): qty 1
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
## 2020-06-14 NOTE — Progress Notes (Signed)
Physical Therapy Session Note  Patient Details  Name: Ricky [name] MRN: [id number] Date of Birth: 07-19-1966  Today's Date: 06/14/2020 PT Individual Time: 2376-2831 PT Individual Time Calculation (min): 42 min   Short Term Goals: Week 1:  PT Short Term Goal 1 (Week 1): Pt will perform bed mobility with CGA without bed features PT Short Term Goal 2 (Week 1): Pt will perform bed<>chair transfers with minA and LRAD PT Short Term Goal 3 (Week 1): Pt will ambulate 24ft with minA and LRAD PT Short Term Goal 4 (Week 1): Pt will negotiate up/down x4 steps with minA and LRAD  Skilled Therapeutic Interventions/Progress Updates:   Received pt supine in bed with RN present, pt agreeable to therapy, and denied any pain during session. Session with emphasis on dressing, functional mobility/transfers, generalized strengthening, amputee education, dynamic standing balance/coordination, ambulation, and improved activity tolerance. Pt donned pants in supine with supervision and increased time and transferred supine<>sitting EOB with HOB elevated and supervision and bed<>WC squat<>pivot without AD and CGA. Pt brushed teeth and washed face sitting in WC at sink with supervision and min cues for visual scanning to locate items on sink. Pt performed WC mobility 149ft x 2 trials using BUE and supervision to/from dayroom with increased time as pt with difficulty propelling WC. Pt transferred sit<>stand with RW and CGA and ambulated 78ft with RW and CGA to mat. Pt transferred sit<>stand with RW and CGA x 3 trials and performed the following exercises standing with BUE support on RW and CGA: -R hip flexion 2x12 -R hip abduction 2x12 -R hip extension 2x12 Discussed equipment and pt reported he doesn't think he has a RW or WC and thinks the plan is to install a ramp to home but has difficulty remembering; need to clarify with CSW. Squat<>pivot mat<>WC without AD and CGA. Concluded session with pt sitting in WC needs  within reach, and seatbelt alarm on.   Therapy Documentation Precautions:  Precautions Precautions: Fall Precaution Comments: R BKA Restrictions Weight Bearing Restrictions: Yes RLE Weight Bearing: Non weight bearing   Therapy/Group: Individual Therapy Martin Majestic PT, DPT   06/14/2020, 7:25 AM

## 2020-06-14 NOTE — Progress Notes (Signed)
Inpatient Rehabilitation  Patient information reviewed and entered into eRehab system by Nikitas Davtyan M. Alaja Goldinger, M.A., CCC/SLP, PPS Coordinator.  Information including medical coding, functional ability and quality indicators will be reviewed and updated through discharge.    

## 2020-06-14 NOTE — Progress Notes (Signed)
Physical Therapy Session Note  Patient Details  Name: Ricky [name] MRN: [id number] Date of Birth: April 27, 1966  Today's Date: 06/14/2020 PT Individual Time: 4098-1191 PT Individual Time Calculation (min): 55 min   Short Term Goals: Week 1:  PT Short Term Goal 1 (Week 1): Pt will perform bed mobility with CGA without bed features PT Short Term Goal 2 (Week 1): Pt will perform bed<>chair transfers with minA and LRAD PT Short Term Goal 3 (Week 1): Pt will ambulate 74ft with minA and LRAD PT Short Term Goal 4 (Week 1): Pt will negotiate up/down x4 steps with minA and LRAD  Skilled Therapeutic Interventions/Progress Updates:    Pt received supine in bed, agreeable to PT session. No complaints of pain. Bed mobility Supervision. Squat pivot transfer to w/c with CGA. Pt is Supervision for w/c mobility to/from therapy gym x 150 ft each direction. Reviewed management of w/c parts including taking leg rests on/off chair, moving armrest, locking brakes, and how to safely position w/c next to mat table and/or bed for safe squat pivot transfer. Provided pt with R residual limb support on w/c for improved positioning. Discussed residual limb positioning to prevent contracture, purpose of shrinker, desensitization techniques for residual limb, and phantom limb pain with patient. Pt will benefit from ongoing amputee education. Squat pivot transfer w/c to/from mat table with CGA. Sit to stand to RW with min A. Standing balance with one UE support on RW while performing horseshoe toss with min A for balance. Pt found to have soiled brief due to trouble using urinal prior to session. While in standing assisted pt with changing brief, pants, and performing pericare. Pt found to have skin breakdown on scrotum, nursing notified. Assisted pt back to bed at end of session. Pt left supine in bed with needs in reach, bed alarm in place at end of session.  Therapy Documentation Precautions:  Precautions Precautions:  Fall Precaution Comments: R BKA Restrictions Weight Bearing Restrictions: Yes RLE Weight Bearing: Non weight bearing   Therapy/Group: Individual Therapy   Peter Congo, PT, DPT  06/14/2020, 5:18 PM

## 2020-06-14 NOTE — Progress Notes (Signed)
Orthopedic Tech Progress Note Patient Details:  Ger Ringenberg 01/18/1966 [id number] Called in order to [facility] for a SHRINKER  Patient ID: Dorothy Spark, male   DOB: April 20, 1966, 54 y.o.   MRN: [id number]   YUMA PACELLA 06/14/2020, 11:29 AM

## 2020-06-14 NOTE — Progress Notes (Signed)
Occupational Therapy Session Note  Patient Details  Name: Ricky [name] MRN: [id number] Date of Birth: 1965-12-04  Today's Date: 06/14/2020 OT Individual Time: 1035-1105 and 1305-1405 OT Individual Time Calculation (min): 30 min and 60 min   Short Term Goals: Week 1:  OT Short Term Goal 1 (Week 1): STGs=LTGs dt ELOS  Skilled Therapeutic Interventions/Progress Updates:    1) Treatment session with focus on BUE strengthening and endurance. Pt received upright in w/c, agreeable to therapy session. Pt declined any bathing or dressing this session.  Pt unable to recall any details from PT session. Pt propelled w/c to/from therapy gym with min cues for technique as she veers Rt and Lt when propelling. Engaged in BUE strengthening with 3# dowel rod with chest presses, overhead presses, lateral rows, and tricep/elbow extension 2 sets of 10.  Pt reports phantom pain in residual limb "at toe".  Therapist educated on phantom pain and various techniques to attempt when onset of phantom pain.  Pt remained upright in w/c with seat belt alarm on and all needs in reach.  2) Treatment session with focus on education on care for residual limb.  Pt received supine in bed completing phone call. Pt agreeable to therapy session. Completed bed mobility and squat pivot transfer bed > w/c > therapy mat with CGA.  Therapist utilized "First Step" publication with focus on education of care for residual limb to address limb wrapping, desensitization, and management of phantom limb sensation and pain.  Therapist educated on mirror therapy with mirror placed between intact and surgical leg with focus on massage and general ROM while visualizing reflection as amputated leg.  Provided rationale for mirror therapy.  Therapist issued inspection mirror and educated on use and frequency of use of mirror to assess skin.  Pt able to verbalize understanding. Engaged in massage and tapping while educating on desensitization and  preparation for prosthesis wear.  Engaged in 2 sets of 10 tricep pushups from yoga blocks with focus on BUE strengthening.  Engaged in supine and sidelying activity with focus on maintaining hip and knee mobility to increase independence with transfers, mobility, and LB dressing at sit > stand level with pt completing hip abduction, flexion, and extension, and knee extension 2 sets of 10 each.  Pt returned to w/c CGA squat pivot and propelled back to room for BUE strengthening. Pt transferred back to bed squat pivot CGA and remained semi-reclined with all needs in reach.  Therapy Documentation Precautions:  Precautions Precautions: Fall Precaution Comments: R BKA Restrictions Weight Bearing Restrictions: Yes RLE Weight Bearing: Non weight bearing Pain:  Pt with no c/o pain   Therapy/Group: Individual Therapy  Rosalio Loud 06/14/2020, 12:31 PM

## 2020-06-14 NOTE — Progress Notes (Signed)
Inpatient Rehabilitation Center Individual Statement of Services  Patient Name:  Ricky [name]  Date:  06/14/2020  Welcome to the Inpatient Rehabilitation Center.  Our goal is to provide you with an individualized program based on your diagnosis and situation, designed to meet your specific needs.  With this comprehensive rehabilitation program, you will be expected to participate in at least 3 hours of rehabilitation therapies Monday-Friday, with modified therapy programming on the weekends.  Your rehabilitation program will include the following services:  Physical Therapy (PT), Occupational Therapy (OT), 24 hour per day rehabilitation nursing, Neuropsychology, Care Coordinator, Rehabilitation Medicine, Nutrition Services and Pharmacy Services  Weekly team conferences will be held on wednesday to discuss your progress.  Your Inpatient Rehabilitation Care Coordinator will talk with you frequently to get your input and to update you on team discussions.  Team conferences with you and your family in attendance may also be held.  Expected length of stay: 10-12 days  Overall anticipated outcome: supervision with cueing  Depending on your progress and recovery, your program may change. Your Inpatient Rehabilitation Care Coordinator will coordinate services and will keep you informed of any changes. Your Inpatient Rehabilitation Care Coordinator's name and contact numbers are listed  below.  The following services may also be recommended but are not provided by the Inpatient Rehabilitation Center:   Driving Evaluations  Home Health Rehabiltiation Services  Outpatient Rehabilitation Services   Arrangements will be made to provide these services after discharge if needed.  Arrangements include referral to agencies that provide these services.  Your insurance has been verified to be:  uninsured Your primary doctor is:  None  Pertinent information will be shared with your doctor and your  insurance company.  Inpatient Rehabilitation Care Coordinator:  Dossie Der, [place] [phone] or Luna [place]  Information discussed with and copy given to patient by: Lucy Chris, 06/14/2020, 9:13 AM

## 2020-06-15 ENCOUNTER — Inpatient Hospital Stay (HOSPITAL_COMMUNITY): Payer: Self-pay | Admitting: Physical Therapy

## 2020-06-15 ENCOUNTER — Inpatient Hospital Stay (HOSPITAL_COMMUNITY): Payer: Self-pay

## 2020-06-15 ENCOUNTER — Inpatient Hospital Stay (HOSPITAL_COMMUNITY): Payer: Self-pay | Admitting: Occupational Therapy

## 2020-06-15 LAB — GLUCOSE, CAPILLARY
Glucose-Capillary: 183 mg/dL — ABNORMAL HIGH (ref 70–99)
Glucose-Capillary: 274 mg/dL — ABNORMAL HIGH (ref 70–99)
Glucose-Capillary: 323 mg/dL — ABNORMAL HIGH (ref 70–99)
Glucose-Capillary: 251 mg/dL — ABNORMAL HIGH (ref 70–99)
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
## 2020-06-15 NOTE — Progress Notes (Signed)
Physical Therapy Session Note  Patient Details  Name: Ricky [name] MRN: [id number] Date of Birth: April 24, 1966  Today's Date: 06/15/2020 PT Individual Time: 1430-1530 PT Individual Time Calculation (min): 60 min   Short Term Goals: Week 1:  PT Short Term Goal 1 (Week 1): Pt will perform bed mobility with CGA without bed features PT Short Term Goal 2 (Week 1): Pt will perform bed<>chair transfers with minA and LRAD PT Short Term Goal 3 (Week 1): Pt will ambulate 66ft with minA and LRAD PT Short Term Goal 4 (Week 1): Pt will negotiate up/down x4 steps with minA and LRAD  Skilled Therapeutic Interventions/Progress Updates:    Pt received seated in w/c in room asleep, arousable and agreeable to PT session. No complaints of pain. Pt reports urge to use the bathroom. Stand pivot transfer w/c to [facility] over toilet with RW and min A for balance. Pt requires cues for safe RW management.Pt is CGA for balance while performing clothing management. Manual w/c propulsion 2 x 150 ft with use of BUE at Supervision level. Reviewed management of w/c parts with pt requiring mod cueing for leg rest management. Squat pivot transfer w/c to/from mat table with CGA. Sit to/from supine with Supervision. Supine BLE strengthening therex: SLR, hip abd, SAQ, modified bridge; sidelying hip abd; prone hip flexor stretch and prone HS curls and hip extension x 10 reps each. Pt returned to sitting EOM with Supervision. Squat pivot transfer back to w/c with CGA. Provided pt with w/c gloves for improved grip with w/c mobility. Pt left seated in w/c in room with needs in reach, quick release belt and chair alarm in place at end of session.  Therapy Documentation Precautions:  Precautions Precautions: Fall Precaution Comments: R BKA Restrictions Weight Bearing Restrictions: Yes RLE Weight Bearing: Non weight bearing   Therapy/Group: Individual Therapy  Peter Congo, PT, DPT  06/15/2020, 5:08 PM

## 2020-06-15 NOTE — Progress Notes (Signed)
Occupational Therapy Session Note  Patient Details  Name: Ricky [name] MRN: [id number] Date of Birth: 06/29/1966  Today's Date: 06/15/2020 OT Individual Time: 7824-2353 OT Individual Time Calculation (min): 72 min    Short Term Goals: Week 1:  OT Short Term Goal 1 (Week 1): STGs=LTGs dt ELOS  Skilled Therapeutic Interventions/Progress Updates:    Treatment session with focus on self-care retraining with sit > stand, dynamic standing balance, and care for residual limb.  Pt received upright in w/c agreeable to bathing and dressing during session. Pt propelled w/c to sink and completed set up with min cues to lock brakes and adjust leg rests.  Pt engaged in bathing at sit > stand level with CGA and good sequencing and awareness of positioning during sit > stand.  Therapist providing CGA for sit > stand and dynamic standing balance when washing buttocks and pulling pants over hips.  Discussed technique for donning shrinker sock, however pt opted to not doff and don this session. Engaged in BUE strengthening and endurance with green (level 3) theraband for generalized strengthening as needed for functional mobility and self-care tasks.  Pt reports fatigue, returned to bed squat pivot CGA. Pt left semi-reclined in bed with all needs in reach.  Therapy Documentation Precautions:  Precautions Precautions: Fall Precaution Comments: R BKA Restrictions Weight Bearing Restrictions: Yes RLE Weight Bearing: Non weight bearing Pain: Pain Assessment Pain Scale: 0-10 Pain Score: 0-No pain Pain Type: Phantom pain Pain Location: Leg Pain Orientation: Right;Distal   Therapy/Group: Individual Therapy  Rosalio Loud 06/15/2020, 12:13 PM

## 2020-06-15 NOTE — Progress Notes (Signed)
Physical Therapy Session Note  Patient Details  Name: Ricky [name] MRN: [id number] Date of Birth: Sep 06, 1965  Today's Date: 06/15/2020 PT Individual Time: 6314-9702 PT Individual Time Calculation (min): 55 min   Short Term Goals: Week 1:  PT Short Term Goal 1 (Week 1): Pt will perform bed mobility with CGA without bed features PT Short Term Goal 2 (Week 1): Pt will perform bed<>chair transfers with minA and LRAD PT Short Term Goal 3 (Week 1): Pt will ambulate 8ft with minA and LRAD PT Short Term Goal 4 (Week 1): Pt will negotiate up/down x4 steps with minA and LRAD  Skilled Therapeutic Interventions/Progress Updates:   Received pt supine in bed on the phone with his mother, pt agreeable to therapy, and denied any pain at start of session but reported increased phantom limb pain during session. Session with emphasis on functional mobility/transfers, generalized strengthening, dynamic standing balance/coordination, and improved activity tolerance. Pt transferred supine<>sitting EOB with HOB elevated and supervision and bed<>WC squat<>pivot without AD and supervision. Pt requesting juice stating he is thirsty and is tired of water. RN cleared pt to have cranberry juice mixed with water due to dietary considerations. Therapist provided pt with drink. Pt brushed teeth and washed face sitting in WC at sink with supervision and performed WC mobility 127ft using BUE and supervision to therapy gym. Pt able to perform WC parts management (doffing legrests) with min cues, Pt performed squat<>pivot x 2 additional trials throughout session with close supervision/CGA. Worked on dynamic standing balance clipping/unclipping clothespins of various resistances to basketball net using L UE with min A for balance x 3 trials. Pt with decreased grip strength requiring increased time to complete activity. Pt also required cues to stand with 1 UE on RW and the other on the mat rather than with BUE on RW for safety. MD  present for morning rounds. Pt performed the following exercises sitting on mat with supervision and verbal cues for technique: -trunk rotation with 4.4lb medicine ball 2x10 bilaterally  -overhead chest press with 4.4lb medicine ball 2x10 -horizontal chest press at 90 degrees with 4.4lb medicine ball 2x10 -hip flexion 2x10 bilaterally with 3lb ankle weight on LLE -LAQ 2x10 bilaterally with 3lb ankle weight on LLE Pt reported increased phantom limb pain and was educated on desensitization strategies including tapping and rubbing; pt demonstrated techniques and reported relief. Pt transported back to room in [facility] total A. Concluded session with pt sitting in WC, needs within reach, and seatbelt alarm on.    Therapy Documentation Precautions:  Precautions Precautions: Fall Precaution Comments: R BKA Restrictions Weight Bearing Restrictions: Yes RLE Weight Bearing: Non weight bearing   Therapy/Group: Individual Therapy Martin Majestic PT, DPT   06/15/2020, 7:19 AM

## 2020-06-16 ENCOUNTER — Inpatient Hospital Stay (HOSPITAL_COMMUNITY): Payer: Self-pay | Admitting: Occupational Therapy

## 2020-06-16 ENCOUNTER — Inpatient Hospital Stay (HOSPITAL_COMMUNITY): Payer: Self-pay

## 2020-06-16 ENCOUNTER — Inpatient Hospital Stay (HOSPITAL_COMMUNITY): Payer: Self-pay | Admitting: Physical Therapy

## 2020-06-16 DIAGNOSIS — R000 Tachycardia, unspecified: Secondary | ICD-10-CM

## 2020-06-16 LAB — GLUCOSE, CAPILLARY
Glucose-Capillary: 220 mg/dL — ABNORMAL HIGH (ref 70–99)
Glucose-Capillary: 148 mg/dL — ABNORMAL HIGH (ref 70–99)
Glucose-Capillary: 200 mg/dL — ABNORMAL HIGH (ref 70–99)
Glucose-Capillary: 223 mg/dL — ABNORMAL HIGH (ref 70–99)

## 2020-06-16 MED ORDER — METOPROLOL TARTRATE 12.5 MG HALF TABLET
12.5000 mg | ORAL_TABLET | Freq: Two times a day (BID) | ORAL | Status: DC
  Administered 2020-06-16 – 2020-06-24 (×16): 12.5 mg via ORAL
  Filled 2020-06-16 (×16): qty 1

## 2020-06-16 MED ORDER — INSULIN GLARGINE 100 UNIT/ML ~~LOC~~ SOLN
20.0000 [IU] | Freq: Every day | SUBCUTANEOUS | Status: DC
  Administered 2020-06-16 – 2020-06-17 (×2): 20 [IU] via SUBCUTANEOUS
  Filled 2020-06-16 (×3): qty 0.2

## 2020-06-16 NOTE — Progress Notes (Addendum)
[facility] PHYSICAL MEDICINE & REHABILITATION PROGRESS NOTE   Subjective/Complaints: Patient seen sitting up in his chair this a.m.  He states he slept well overnight. He denies complaints.  Discussed dressing with nursing.  ROS: Denies CP, SOB, N/V/D  Objective:   No results found. Recent Labs    06/14/20 1003  WBC 11.9*  HGB 11.8*  HCT 37.2*  PLT 662*   Recent Labs    06/14/20 1003  NA 139  K 3.0*  CL 101  CO2 28  GLUCOSE 275*  BUN 9  CREATININE 1.45*  CALCIUM 8.5*    Intake/Output Summary (Last 24 hours) at 06/16/2020 1043 Last data filed at 06/16/2020 0818 Gross per 24 hour  Intake 720 ml  Output --  Net 720 ml        Physical Exam: Vital Signs Blood pressure (!) 143/79, pulse 100, temperature 98.7 F (37.1 C), resp. rate 18, height 5\' 6"  (1.676 m), weight 71.9 kg, SpO2 96 %. Constitutional: No distress . Vital signs reviewed. HENT: Normocephalic.  Atraumatic. Eyes: EOMI. No discharge. Cardiovascular: No JVD.  RRR. Respiratory: Normal effort.  No stridor.  Bilateral clear to auscultation. GI: Non-distended.  BS +. Skin: Warm and dry.  Right BKA with staples CDI Psych: Normal mood.  Normal behavior. Musc: Right BKA with edema and tenderness, improving Neuro: Alert Motor:  Left lower extremity: 5/5 proximal distal Right lower extremity: Hip flexion: 4/5 (some pain inhibition)  Assessment/Plan: 1. Functional deficits secondary to right BKA which require 3+ hours per day of interdisciplinary therapy in a comprehensive inpatient rehab setting.  Physiatrist is providing close team supervision and 24 hour management of active medical problems listed below.  Physiatrist and rehab team continue to assess barriers to discharge/monitor patient progress toward functional and medical goals  Care Tool:  Bathing    Body parts bathed by patient: Right arm, Left arm, Chest, Abdomen, Front perineal area, Right upper leg, Left upper leg, Face, Buttocks, Left  lower leg   Body parts bathed by helper: Buttocks, Right lower leg Body parts n/a: Right lower leg   Bathing assist Assist Level: Contact Guard/Touching assist     Upper Body Dressing/Undressing Upper body dressing   What is the patient wearing?: Pull over shirt    Upper body assist Assist Level: Set up assist    Lower Body Dressing/Undressing Lower body dressing      What is the patient wearing?: Pants     Lower body assist Assist for lower body dressing: Contact Guard/Touching assist     Toileting Toileting    Toileting assist Assist for toileting: Minimal Assistance - Patient > 75%     Transfers Chair/bed transfer  Transfers assist     Chair/bed transfer assist level: Contact Guard/Touching assist     Locomotion Ambulation   Ambulation assist      Assist level: Contact Guard/Touching assist Assistive device: Walker-rolling Max distance: 6ft   Walk 10 feet activity   Assist     Assist level: Contact Guard/Touching assist Assistive device: Walker-rolling   Walk 50 feet activity   Assist Walk 50 feet with 2 turns activity did not occur: Safety/medical concerns (fatigue)         Walk 150 feet activity   Assist Walk 150 feet activity did not occur: Safety/medical concerns         Walk 10 feet on uneven surface  activity   Assist Walk 10 feet on uneven surfaces activity did not occur: Safety/medical concerns  Wheelchair     Assist Will patient use wheelchair at discharge?: Yes Type of Wheelchair: Manual    Wheelchair assist level: Supervision/Verbal cueing Max wheelchair distance: 150'    Wheelchair 50 feet with 2 turns activity    Assist        Assist Level: Supervision/Verbal cueing   Wheelchair 150 feet activity     Assist      Assist Level: Supervision/Verbal cueing   Blood pressure (!) 143/79, pulse 100, temperature 98.7 F (37.1 C), resp. rate 18, height 5\' 6"  (1.676 m), weight 71.9  kg, SpO2 96 %.  Medical Problem List and Plan: 1.Decreased functional abilitysecondary to right BKA 06/08/2020 due to necrotizing fasciitis.   Continue CIR  Team conference today to discuss current and goals and coordination of care, home and environmental barriers, and discharge planning with nursing, case manager, and therapies. Please see conference note from today as well.  2. Antithrombotics: -DVT/anticoagulation:Subcutaneous heparin -antiplatelet therapy: N/A 3. Pain Management:Oxycodone as needed  Controlled with meds on 11/3 4. Mood:Provide emotional support -antipsychotic agents: N/A 5. Neuropsych: This patientiscapable of making decisions on hisown behalf. 6. Skin/Wound Care:              Continue stump shrinker 7. Fluids/Electrolytes/Nutrition:Routine in and outs. 8.  CKD versus AKI.   Creatinine 1.45 on 11/1, continue to monitor 9. Diabetes mellitus peripheral neuropathy. Hemoglobin A1c 14.5. Lantus insulin 30 units daily. Diabetic teaching  Lantus 10 nightly started on 11/1, increased to 20 on 11/3  Monitor with increased mobility 10. Hypertension/sinus tachycardia.   Lisinopril 10 mg daily, increased to 20 on 11/1  HCTZ 12.5 mg daily, Norvasc 10 mg daily.  ECG showing sinus tachycardia  Metoprolol 12.5 twice daily started on 11/3  Monitor with increased mobility 11. 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 Patient reportedly independent prior to admission. 12. Hyperlipidemia. Resume Lipitor 13. ID: wound cx still pending, multiple organisms.    Completed course of vanc and zosyn.     WBCs 11.9 on 11/1  Afebrile 14.  Hypokalemia  Potassium 3.0 on 11/1, labs ordered for tomorrow  Supplement x2 days  15.?  Anemia of chronic disease  Hemoglobin 11.8 on 11/1  Continue to monitor   LOS: 5 days A FACE TO FACE EVALUATION WAS PERFORMED  Ricky [name] 13/1 06/16/2020, 10:43 AM

## 2020-06-16 NOTE — Progress Notes (Signed)
Physical Therapy Session Note  Patient Details  Name: Ricky [name] MRN: [id number] Date of Birth: [birth year]  Today's Date: 06/16/2020 PT Individual Time: 1400-1515 PT Individual Time Calculation (min): 75 min   Short Term Goals: Week 1:  PT Short Term Goal 1 (Week 1): Pt will perform bed mobility with CGA without bed features PT Short Term Goal 2 (Week 1): Pt will perform bed<>chair transfers with minA and LRAD PT Short Term Goal 3 (Week 1): Pt will ambulate 59ft with minA and LRAD PT Short Term Goal 4 (Week 1): Pt will negotiate up/down x4 steps with minA and LRAD  Skilled Therapeutic Interventions/Progress Updates:    Pt received seated in bed, agreeable to PT session. No complaints of pain. Bed mobility Supervision. Squat pivot transfer to w/c with CGA throughout session. Pt is at Supervision level for w/c mobility up to 150 ft with use of BUE. Reviewed management of w/c parts and safe setup of w/c for squat pivot transfers. Pt with poor recall of education already reviewed with him with regards to this. Sit to stand with CGA to RW. Standing balance with one UE support on RW and CGA while performing horseshoe toss, 3 x 8 tosses. Pt then reports urge to use bathroom. Toilet transfer with CGA and RW. Pt is setup A for clothing management and pericare following continent void. Ambulation 2 x 56 ft with RW and CGA increasing to min A with onset of fatigue. Pt also exhibits decreased ability to clear L lower extremity during gait with onset of fatigue. Standing RLE therex in // bars 2 x 10 reps: hip flex, hip abd, hip ext, HS curls. Standing LLE heel raises x 20 reps. Pt requests to return to bed at end of session. Squat pivot transfer w/c to bed with CGA. Sit to supine Supervision. Pt left semi-reclined in bed with needs in reach, bed alarm in place at end of session.  Therapy Documentation Precautions:  Precautions Precautions: Fall Precaution Comments: R BKA Restrictions Weight Bearing  Restrictions: Yes RLE Weight Bearing: Non weight bearing   Therapy/Group: Individual Therapy   Peter Congo, PT, DPT  06/16/2020, 5:47 PM

## 2020-06-16 NOTE — Patient Care Conference (Signed)
Inpatient RehabilitationTeam Conference and Plan of Care Update Date: 06/16/2020   Time: 11:47 AM    Patient Name: Ricky [name]      Medical Record Number: [id number]  Date of Birth: 10/11/1965 Sex: Male         Room/Bed: 4W21C/4W21C-01 Payor Info: Payor: /    Admit Date/Time:  06/11/2020  3:13 PM  Primary Diagnosis:  Right below-knee amputee [facility])  Hospital Problems: Principal Problem:   Right below-knee amputee (HCC) Active Problems:   Anemia of chronic disease   Hypokalemia   Essential hypertension   Diabetic peripheral neuropathy (HCC)   AKI (acute kidney injury) (HCC)   Postoperative pain   Sinus tachycardia    Expected Discharge Date: Expected Discharge Date: (P) 06/24/20  Team Members Present: Physician leading conference: (P) Dr. Maryla Morrow Care Coodinator Present: (P) Chana Bode, RN, BSN, CRRN;Becky Dupree, LCSW Nurse Present: Demetrius Charity) Other (comment) Lupita Dawn, RN) PT Present: Demetrius Charity) Raechel Chute, PT OT Present: Demetrius Charity) Rosalio Loud, OT PPS Coordinator present : (P) Edson Snowball, Park Breed, SLP     Current Status/Progress Goal Weekly Team Focus  Bowel/Bladder   Pt continent of B/B. LBM 06/16/2020  Pt to remain continent of B/B  Assess every shift and PRN   Swallow/Nutrition/ Hydration             ADL's   transfers squat pivot or with RW CGA, bathing and LB dressing CGA at sit > stand level  Supervision overall- bathing, transfers, toileting, Mod I UB and LB dressing  ADL retraining, transfers, amputee education, dynamic standing balance, endurance, generalized strengthening   Mobility   bed mobility supervision, transfers with RW CGA, gait 23ft with RW min A, WC mobility 128ft supervision  supervision  functional mobility/transfers, amputee education, generalized strengthening, dynamic standing balance/coordination, and endurance   Communication             Safety/Cognition/ Behavioral Observations            Pain   Pt currently denies pain.  Last PRN pain medication given 06/14/2020  Pain level to remain <3/10  Assess pain every shift and PRN   Skin   Right BKA with staples and a shrinker  No new skin breakdown  Assess skin every shift and PRN     Discharge Planning:  Lives with elderly Mother who can provide supervision but not much physical assist. Pt will need to reach supervision level goals to be safe at home. Will set up with [facility] upon DC   Team Discussion: CBGs trending up, Tachycardia per MD. Staff note poor historian, decreased memory and poor carryover. Patient on target to meet rehab goals: yes, supervision goals set  *See Care Plan and progress notes for long and short-term goals.   Revisions to Treatment Plan:   Teaching Needs: Medications, DM management, transfers, toileting, etc.   Current Barriers to Discharge: Decreased caregiver support and Home enviroment access/layout, insurance coverage for recommended DME and follow up  Possible Resolutions to Barriers: Family education with mother and another family member Recommend several pieces of DME and [facility] services     Medical Summary Current Status: Decreased functional ability secondary to right BKA 06/08/2020 due to necrotizing fasciitis.  Barriers to Discharge: Medical stability;Wound care;Weight bearing restrictions;Other (comments)  Barriers to Discharge Comments: Uncontrolled DM, No DME due to lack of insurance Possible Resolutions to [employer] Focus: Therapies, follow labs - Cr, K+, Hb, optimize pain meds, optimize DM/BP/HR meds   Continued Need for  Acute Rehabilitation Level of Care: The patient requires daily medical management by a physician with specialized training in physical medicine and rehabilitation for the following reasons: Direction of a multidisciplinary physical rehabilitation program to maximize functional independence : Yes Medical management of patient stability for increased activity during  participation in an intensive rehabilitation regime.: Yes Analysis of laboratory values and/or radiology reports with any subsequent need for medication adjustment and/or medical intervention. : Yes   I attest that I was present, lead the team conference, and concur with the assessment and plan of the team.   Chana Bode B 06/16/2020, 2:24 PM

## 2020-06-16 NOTE — Progress Notes (Signed)
[employer] PN Engineer, building services assessed pt. Abnormal heart sound heard best in the aortic area. Right JVD noted. Instructor notified pt's nurse, Paula Compton, of abnormalities in assessment.  Paula Compton verbalized understanding.

## 2020-06-16 NOTE — Progress Notes (Signed)
Patient ID: Ricky [name], male   DOB: 1966/02/17, 54 y.o.   MRN: [id number]  Met with pt and left message for his Mom. Also spoke with his daughter-Ricky [name] to inform of team conference-goals of supervision overall and target discharge date 11/11. Daughter and Mom report she has three steps with one rail. Have scheduled daughter to come in next Wed at 1:00-3:00 pm for training. Daughter wanted to know about the disability he needs to apply for. Will reach out to med-assist for this.

## 2020-06-16 NOTE — Progress Notes (Signed)
Occupational Therapy Session Note  Patient Details  Name: Ricky [name] MRN: [id number] Date of Birth: 1966/08/13  Today's Date: 06/16/2020 OT Individual Time: 6720-9470 OT Individual Time Calculation (min): 54 min    Short Term Goals: Week 1:  OT Short Term Goal 1 (Week 1): STGs=LTGs dt ELOS  Skilled Therapeutic Interventions/Progress Updates:    Treatment session with focus on care for residual limb and dynamic standing balance.  Pt received supine in bed declining any bathing or dressing this session.  Therapist reeducated on care for residual limb with focus on inspection of incision 1-2x/day with use of inspection mirror.  Pt doffed shrinker sock and utilized mirror with min cues to ensure attention to entire lower limb.  Pt required assistance when donning shrinker due to decreased grip strength.  Therapist reiterated importance of desensitization strategies with massage and tapping as pt reports that he continues to have phantom pain/sensation.  Engaged in dynamic standing activity from EOB with focus on sit > stand and dynamic standing balance.  Engaged in Connect 4 in standing with CGA for sit > stand and dynamic standing with focus on alternating UE support while maintaining balance.  Discussed home setup and recommendation for drop arm BSC for safety with toilet transfers and use of tub transfer bench if to engage in bathing at shower level, plan to further address these during therapy sessions.  Pt returned to supine with supervision and remained in bed with all needs in reach.   Therapy Documentation Precautions:  Precautions Precautions: Fall Precaution Comments: R BKA Restrictions Weight Bearing Restrictions: Yes RLE Weight Bearing: Non weight bearing Pain:  Pt with no c/o pain   Therapy/Group: Individual Therapy  Rosalio Loud 06/16/2020, 12:34 PM

## 2020-06-16 NOTE — Progress Notes (Signed)
Physical Therapy Session Note  Patient Details  Name: Ricky [name] MRN: [id number] Date of Birth: 1966/01/18  Today's Date: 06/16/2020 PT Individual Time: 0900-0958 PT Individual Time Calculation (min): 58 min   Short Term Goals: Week 1:  PT Short Term Goal 1 (Week 1): Pt will perform bed mobility with CGA without bed features PT Short Term Goal 2 (Week 1): Pt will perform bed<>chair transfers with minA and LRAD PT Short Term Goal 3 (Week 1): Pt will ambulate 79ft with minA and LRAD PT Short Term Goal 4 (Week 1): Pt will negotiate up/down x4 steps with minA and LRAD  Skilled Therapeutic Interventions/Progress Updates:   Received pt sitting in WC with residual limb exposed. Pt agreeable to therapy, and denied any pain at start of session but reported increased phantom limb pain with mobility. Per RN, MD awaiting therapist to educate pt on donning shinker and shrinker care. Session with emphasis on functional mobility/transfers, generalized strengthening, dynamic standing balance/coordination, amputee education, ambulation, and improved activity tolerance. Therapist educated pt on shrinker care including washing/drying instructions and washed shrinker and laid flat to dry in room. Applied non-adherent gauze pad and donned shrinker with total A while educating pt on technique. Pt verbalized understanding but continued need for practice; discussed with OT. Pt performed WC mobility >133ft using BUE and supervision to therapy gym. Pt ambulated 42ft with RW and CGA to mat with cues for energy conservation strategies. Pt reported increased pain in R residual limb stating "It feels like it's swinging" but was unable to elaborate on pain description. Re-educated pt on densensitization strategies and pt reported relief after massaging residual limb. Worked on dynamic standing balance tossing horsehoses with L UE and CGA for balance x 3 trials. Noted pt pushing R residual limb against mat for balance and  educated on importance of avoiding putting pressure/weight at end of residual limb. Pt performed the following exercises sitting on mat with supervision and verbal cues for technique: -tricep extensions on yoga blocks 2x10 -LAQ 2x10 bilaterally with 3lb ankle weight on LLE with emphasis on R knee extension -hip flexion 2x10 bilaterally with 3lb ankle weight on LLE Stand<>pivot mat<>WC with RW and CGA. Pt transported back to room in [facility] total A and requested to return to bed. Squat<>pivot WC<>bed with supervision and sit<>supine with supervision. Concluded session with pt supine in bed, needs within reach, and bed alarm on. Therapist provided fresh ice water for pt.   Therapy Documentation Precautions:  Precautions Precautions: Fall Precaution Comments: R BKA Restrictions Weight Bearing Restrictions: Yes RLE Weight Bearing: Non weight bearing   Therapy/Group: Individual Therapy Martin Majestic PT, DPT   06/16/2020, 7:29 AM

## 2020-06-17 ENCOUNTER — Inpatient Hospital Stay (HOSPITAL_COMMUNITY): Payer: Self-pay | Admitting: *Deleted

## 2020-06-17 ENCOUNTER — Inpatient Hospital Stay (HOSPITAL_COMMUNITY): Payer: Self-pay

## 2020-06-17 ENCOUNTER — Encounter (HOSPITAL_COMMUNITY): Payer: Self-pay | Admitting: Physical Medicine & Rehabilitation

## 2020-06-17 ENCOUNTER — Inpatient Hospital Stay (HOSPITAL_COMMUNITY): Payer: Self-pay | Admitting: Occupational Therapy

## 2020-06-17 LAB — GLUCOSE, CAPILLARY
Glucose-Capillary: 147 mg/dL — ABNORMAL HIGH (ref 70–99)
Glucose-Capillary: 175 mg/dL — ABNORMAL HIGH (ref 70–99)
Glucose-Capillary: 171 mg/dL — ABNORMAL HIGH (ref 70–99)
Glucose-Capillary: 217 mg/dL — ABNORMAL HIGH (ref 70–99)

## 2020-06-17 LAB — BASIC METABOLIC PANEL
Anion gap: 9 (ref 5–15)
BUN: 12 mg/dL (ref 6–20)
CO2: 28 mmol/L (ref 22–32)
Calcium: 8.9 mg/dL (ref 8.9–10.3)
Chloride: 103 mmol/L (ref 98–111)
Creatinine, Ser: 1.35 mg/dL — ABNORMAL HIGH (ref 0.61–1.24)
GFR, Estimated: >60 mL/min (ref >60)
Glucose, Bld: 151 mg/dL — ABNORMAL HIGH (ref 70–99)
Potassium: 3.3 mmol/L — ABNORMAL LOW (ref 3.5–5.1)
Sodium: 140 mmol/L (ref 135–145)

## 2020-06-17 MED ORDER — POTASSIUM CHLORIDE CRYS ER 20 MEQ PO TBCR
30.0000 meq | EXTENDED_RELEASE_TABLET | Freq: Two times a day (BID) | ORAL | Status: AC
  Administered 2020-06-17 – 2020-06-20 (×6): 30 meq via ORAL
  Filled 2020-06-17 (×7): qty 1

## 2020-06-17 NOTE — Progress Notes (Signed)
Physical Therapy Session Note  Patient Details  Name: Ricky [name] MRN: [id number] Date of Birth: April 16, 1966  Today's Date: 06/17/2020 PT Individual Time: 4818-5909 PT Individual Time Calculation (min): 53 min   Short Term Goals: Week 1:  PT Short Term Goal 1 (Week 1): Pt will perform bed mobility with CGA without bed features PT Short Term Goal 2 (Week 1): Pt will perform bed<>chair transfers with minA and LRAD PT Short Term Goal 3 (Week 1): Pt will ambulate 93ft with minA and LRAD PT Short Term Goal 4 (Week 1): Pt will negotiate up/down x4 steps with minA and LRAD  Skilled Therapeutic Interventions/Progress Updates:   Received pt sitting in WC, pt agreeable to therapy, and denied any pain during session. Session with emphasis on functional mobility/transfers, generalized strengthening, dynamic standing balance/coordination, ambulation, stair navigation, and improved activity tolerance. Donned WC gloves and pt performed WC mobility 159ft using BUE and supervision to therapy gym. Therapist demonstrated stair navigation technique with shower chair and pt navigated 3 steps with L handrail and mod A with total A from therapist to manage shower chair. Pt required cues for positioning of R residual limb and hand placement on shower chair and railing for safety and stability. Pt transported to dayroom in [facility] total A and ambulated 30ft with RW and CGA with cues to control eccentric movement coming down to avoid compressive forces on L knee. Pt performed LLE strengthening on Kinetron at 10cm/sec for 1 minute x 3 trials with therapist providing manual counter resistance with cues for anterior weight shifting to emphasize quad/glute strengthening. Worked on dynamic standing balance playing connect four with 1 UE support and CGA for balance. Pt stated "I don't like the game" and requested to do something different. Pt performed 5x sit<>stand with RW and CGA in 24 seconds with cues to push up from [facility] armrests  instead of pushing with BUE on RW. Pt transported back to room in [facility] total A. Concluded session with pt sitting in WC, needs within reach, and seatbelt alarm on.   Therapy Documentation Precautions:  Precautions Precautions: Fall Precaution Comments: R BKA Restrictions Weight Bearing Restrictions: Yes RLE Weight Bearing: Non weight bearing   Therapy/Group: Individual Therapy Martin Majestic PT, DPT   06/17/2020, 7:35 AM

## 2020-06-17 NOTE — Progress Notes (Signed)
[employer] PHYSICAL MEDICINE & REHABILITATION PROGRESS NOTE   Subjective/Complaints: Patient seen sitting up in bed this AM.  He states he slept well overnight.  He is aware of his discharge date.  He denies complaints.   ROS: Denies CP, SOB, N/V/D  Objective:   No results found. No results for input(s): WBC, HGB, HCT, PLT in the last 72 hours. Recent Labs    06/17/20 0443  NA 140  K 3.3*  CL 103  CO2 28  GLUCOSE 151*  BUN 12  CREATININE 1.35*  CALCIUM 8.9    Intake/Output Summary (Last 24 hours) at 06/17/2020 1659 Last data filed at 06/17/2020 1300 Gross per 24 hour  Intake 480 ml  Output 500 ml  Net -20 ml        Physical Exam: Vital Signs Blood pressure 110/71, pulse 95, temperature 99 F (37.2 C), resp. rate 18, height 5\' 6"  (1.676 m), weight 71.9 kg, SpO2 97 %.  Constitutional: No distress . Vital signs reviewed. HENT: Normocephalic.  Atraumatic. Eyes: EOMI. No discharge. Cardiovascular: No JVD.  RRR. Respiratory: Normal effort.  No stridor.  Bilateral clear to auscultation. GI: Non-distended.  BS +. Skin: Warm and dry.  Right BKA with dressing CDI.  Psych: Normal mood.  Normal behavior. Musc: Right BKA with edema and tenderness, improving Neuro: Alert Motor:  Left lower extremity: 5/5 proximal distal Right lower extremity: Hip flexion: 4/5 (some pain inhibition), improving  Assessment/Plan: 1. Functional deficits secondary to right BKA which require 3+ hours per day of interdisciplinary therapy in a comprehensive inpatient rehab setting.  Physiatrist is providing close team supervision and 24 hour management of active medical problems listed below.  Physiatrist and rehab team continue to assess barriers to discharge/monitor patient progress toward functional and medical goals  Care Tool:  Bathing    Body parts bathed by patient: Right arm, Left arm, Chest, Abdomen, Front perineal area, Right upper leg, Left upper leg, Face, Buttocks, Left lower leg    Body parts bathed by helper: Buttocks, Right lower leg Body parts n/a: Right lower leg   Bathing assist Assist Level: Contact Guard/Touching assist     Upper Body Dressing/Undressing Upper body dressing   What is the patient wearing?: Pull over shirt    Upper body assist Assist Level: Set up assist    Lower Body Dressing/Undressing Lower body dressing      What is the patient wearing?: Pants     Lower body assist Assist for lower body dressing: Contact Guard/Touching assist     Toileting Toileting    Toileting assist Assist for toileting: Minimal Assistance - Patient > 75%     Transfers Chair/bed transfer  Transfers assist     Chair/bed transfer assist level: Contact Guard/Touching assist     Locomotion Ambulation   Ambulation assist      Assist level: Contact Guard/Touching assist Assistive device: Walker-rolling Max distance: 40ft   Walk 10 feet activity   Assist     Assist level: Contact Guard/Touching assist Assistive device: Walker-rolling   Walk 50 feet activity   Assist Walk 50 feet with 2 turns activity did not occur: Safety/medical concerns (fatigue)  Assist level: Contact Guard/Touching assist Assistive device: Walker-rolling    Walk 150 feet activity   Assist Walk 150 feet activity did not occur: Safety/medical concerns         Walk 10 feet on uneven surface  activity   Assist Walk 10 feet on uneven surfaces activity did not occur: Safety/medical concerns  Wheelchair     Assist Will patient use wheelchair at discharge?: Yes Type of Wheelchair: Manual    Wheelchair assist level: Supervision/Verbal cueing Max wheelchair distance: 150'    Wheelchair 50 feet with 2 turns activity    Assist        Assist Level: Supervision/Verbal cueing   Wheelchair 150 feet activity     Assist      Assist Level: Supervision/Verbal cueing   Blood pressure 110/71, pulse 95, temperature 99 F (37.2  C), resp. rate 18, height 5\' 6"  (1.676 m), weight 71.9 kg, SpO2 97 %.  Medical Problem List and Plan: 1.Decreased functional abilitysecondary to right BKA 06/08/2020 due to necrotizing fasciitis.   Continue CIR 2. Antithrombotics: -DVT/anticoagulation:Subcutaneous heparin -antiplatelet therapy: N/A 3. Pain Management:Oxycodone as needed  Controlled with meds on 11/4 4. Mood:Provide emotional support -antipsychotic agents: N/A 5. Neuropsych: This patientiscapable of making decisions on hisown behalf. 6. Skin/Wound Care:              Continue stump shrinker 7. Fluids/Electrolytes/Nutrition:Routine in and outs. 8.  CKD:   Creatinine 1.35 on 11/4, continue to monitor 9. Diabetes mellitus peripheral neuropathy. Hemoglobin A1c 14.5. Lantus insulin 30 units daily. Diabetic teaching  Lantus 10 nightly started on 11/1, increased to 20 on 11/3  Elevated, but improving on 11/4  Monitor with increased mobility 10. Hypertension/sinus tachycardia.   Lisinopril 10 mg daily, increased to 20 on 11/1  HCTZ 12.5 mg daily, Norvasc 10 mg daily.  ECG showing sinus tachycardia  Metoprolol 12.5 twice daily started on 11/3  Blood pressure labile on 11/4, monitor for trend  HR relatively controlled on 11/4  Monitor with increased mobility 11. 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 Patient reportedly independent prior to admission. 12. Hyperlipidemia. Resume Lipitor 13. ID: wound cx negative.    Completed course of vanc and zosyn.     WBCs 11.9 on 11/1  Afebrile 14.  Hypokalemia  Potassium 3.3 on 11/4  Supplement x3 again 15.?  Anemia of chronic disease  Hemoglobin 11.8 on 11/1  Continue to monitor   LOS: 6 days A FACE TO FACE EVALUATION WAS PERFORMED  Kylee Nardozzi 13/1 06/17/2020, 4:59 PM

## 2020-06-17 NOTE — Progress Notes (Signed)
Occupational Therapy Session Note  Patient Details  Name: Ricky [name] MRN: [id number] Date of Birth: 04-Jun-1966  Today's Date: 06/17/2020 OT Individual Time: 1003-1100 OT Individual Time Calculation (min): 57 min    Short Term Goals: Week 1:  OT Short Term Goal 1 (Week 1): STGs=LTGs dt ELOS  Skilled Therapeutic Interventions/Progress Updates:    Pt completed wheelchair mobility down to the the tub room with supervision and increased time.  He was then able to complete tub transfer using the RW and tub bench with overall min assist.  Increased LOB posteriorly during transfer as well as to the right on two occasions.  He then worked on standing balance at [employer] for intervals of one minute.  He was able to maintain standing balance with min to min guard assist with an average amount of time of 2.5 seconds overall between lights.  He completed three intervals before transferring over to the UE ergonometer for UE strengthening.  He was able to perform 2 intervals of 7.5 mins with resistance set on level 7 and RPMs maintained at 20-21.   HR at 91 with O2 sats at 98% on room air after completion of the first 7 minutes.  The first set was peddling forward with the second set peddling backwards.  Finished session with return to the room with therapist assist and pt left sitting up in the wheelchair with the call button and phone in reach.  Safety belt in place as well.    Therapy Documentation Precautions:  Precautions Precautions: Fall Precaution Comments: R BKA Restrictions Weight Bearing Restrictions: No RLE Weight Bearing: Non weight bearing  Pain:  See Pain Flowsheet for details ADL: See Care Tool Section for some details of mobility and selfcare  Therapy/Group: Individual Therapy  Glenora Morocho OTR/L 06/17/2020, 4:00 PM

## 2020-06-17 NOTE — Progress Notes (Addendum)
Physical Therapy Session Note  Patient Details  Name: Ricky [name] MRN: [id number] Date of Birth: [birth year]  Today's Date: 06/17/2020 PT Individual Time: 0750-0900 PT Individual Time Calculation (min): 70 min   Short Term Goals: Week 1:  PT Short Term Goal 1 (Week 1): Pt will perform bed mobility with CGA without bed features PT Short Term Goal 2 (Week 1): Pt will perform bed<>chair transfers with minA and LRAD PT Short Term Goal 3 (Week 1): Pt will ambulate 84ft with minA and LRAD PT Short Term Goal 4 (Week 1): Pt will negotiate up/down x4 steps with minA and LRAD Week 2:    Week 3:     Skilled Therapeutic Interventions/Progress Updates:    PAIN 5/10, nursing provided pain meds at onset of session, rest and repositioning as needed.  Pt initially supine and agreeable to session.  Supine to sit w/rails and supervision. Squat pvt transfer to wc w/cga and set up only.  wc propulsion x 166ft , chair veers to L, added 5lb wt to chair w some improvement in alignment w/propulsion x 30ft mod I, removed and added 9lb wt, pt propelled x 166ft mod I, states significant improvement but chair continues to mildly veer L  Gait:18ft, 36ft w/RW w/cga, cues for press and sway to improve clearance LLE and decrease impact forces on LLE, discussed obtaining shoe for protection of L foot w/gait and importance of foot care/inspection w/diabetes.  All mobility on mat performed w/supervison only.  Therex: Seated LAQs 2x25 Supine SLR 2 x 30              TKEs 2x 30 Sidelying hip abd2 x 20                 Hip extension 2 x 20 Prone knee flexion 2x 25            Hip extesnion 2 x 25  Pt w/questions regarding IADLs/driving w/prosthesis.  Discussed process of learning to use prosthesis and anticipation of return to full independent functional w/all IADLs.  Pt left oob in wc w/alarm belt set and needs in reach  Therapy Documentation Precautions:  Precautions Precautions: Fall Precaution Comments:  R BKA Restrictions Weight Bearing Restrictions: Yes RLE Weight Bearing: Non weight bearing    Therapy/Group: Individual Therapy  Rada Hay, PT   [name] 06/17/2020, 12:55 PM
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
## 2020-06-18 ENCOUNTER — Inpatient Hospital Stay (HOSPITAL_COMMUNITY): Payer: Self-pay

## 2020-06-18 ENCOUNTER — Encounter (HOSPITAL_COMMUNITY): Payer: Self-pay | Admitting: Psychology

## 2020-06-18 ENCOUNTER — Inpatient Hospital Stay (HOSPITAL_COMMUNITY): Payer: Self-pay | Admitting: Occupational Therapy

## 2020-06-18 LAB — GLUCOSE, CAPILLARY
Glucose-Capillary: 118 mg/dL — ABNORMAL HIGH (ref 70–99)
Glucose-Capillary: 160 mg/dL — ABNORMAL HIGH (ref 70–99)
Glucose-Capillary: 140 mg/dL — ABNORMAL HIGH (ref 70–99)
Glucose-Capillary: 228 mg/dL — ABNORMAL HIGH (ref 70–99)

## 2020-06-18 MED ORDER — INSULIN GLARGINE 100 UNIT/ML ~~LOC~~ SOLN
21.0000 [IU] | Freq: Every day | SUBCUTANEOUS | Status: DC
  Administered 2020-06-18: 21 [IU] via SUBCUTANEOUS
  Filled 2020-06-18 (×2): qty 0.21

## 2020-06-18 NOTE — Progress Notes (Signed)
Occupational Therapy Session Note  Patient Details  Name: Ricky [name] MRN: [id number] Date of Birth: 1965/11/06  Today's Date: 06/18/2020 OT Individual Time: 1115-1200 OT Individual Time Calculation (min): 45 min    Short Term Goals: Week 1:  OT Short Term Goal 1 (Week 1): STGs=LTGs dt ELOS  Skilled Therapeutic Interventions/Progress Updates:    Treatment session with focus on BUE strengthening and endurance as well as functional transfers and dynamic standing balance. Pt received upright in w/c declining bathing/dressing this session.  Pt propelled w/c to therapy gym with supervision.  Completed stand pivot transfer to therapy mat with CGA, pt with LOB backwards upon initial sit to mat requiring min assist to regain balance.  Engaged in BUE strengthening and endurance with circuit training with 2 rounds of rebounder with 2kg ball, overhead and chest presses with 2kg ball 2 sets of 15, and trunk rotation with 2kg ball.  Pt able to complete 2 rounds with rest break between each activity.  Engaged in 2 sets of 12 tricep pushups on yoga blocks to aid in sit > stand and transfers.  Pt reports need to toilet.  Completed stand pivot w/c > BSC over toilet with use of grab bar and CGA.  Pt required assistance with hygiene due to BM, but able to maintain standing balance with supervision during clothing management.  Pt returned to w/c CGA stand pivot and remained upright at sink to complete hand hygiene while RN arriving to check blood sugar and round on pt.  Therapy Documentation Precautions:  Precautions Precautions: Fall Precaution Comments: R BKA Restrictions Weight Bearing Restrictions: No RLE Weight Bearing: Non weight bearing Pain:   Pt with no c/o pain  Therapy/Group: Individual Therapy  Rosalio Loud 06/18/2020, 3:29 PM
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
## 2020-06-18 NOTE — Progress Notes (Signed)
Recreational Therapy Session Note  Patient Details  Name: Ricky [name] MRN: [id number] Date of Birth: 08-26-1965 Today's Date: 06/18/2020  Pain: no c/o Skilled Therapeutic Interventions/Progress Updates: Pt reports he enjoys music.  Therapeutic use of music and leisure discussion as a distraction and motivation during co-treat with PT with supervision/set up.  Pt performed mat exercises per PT instruction.  Pt propelled w/c through the unit using BUEs with supervision. Therapy/Group: Co-Treatment Emmilia Sowder 06/18/2020, 12:39 PM

## 2020-06-18 NOTE — Progress Notes (Signed)
Physical Therapy Session Note  Patient Details  Name: Ricky [name] MRN: [id number] Date of Birth: 16-Feb-1966  Today's Date: 06/18/2020 PT Individual Time: 0900-1000 and 1300-1330 PT Individual Time Calculation (min): 60 min and 30 min  Short Term Goals: Week 1:  PT Short Term Goal 1 (Week 1): Pt will perform bed mobility with CGA without bed features PT Short Term Goal 2 (Week 1): Pt will perform bed<>chair transfers with minA and LRAD PT Short Term Goal 3 (Week 1): Pt will ambulate 85ft with minA and LRAD PT Short Term Goal 4 (Week 1): Pt will negotiate up/down x4 steps with minA and LRAD Week 2:    Week 3:     Skilled Therapeutic Interventions/Progress Updates:    AM SESSION PAIN Pt initially supine and requesting assist to BR.   Squat pvt to wc w/set up, supervision.  Squat pvt to/from commode w/set up, supervision Pt continent of bowel and bladder Sit to stand mult times using L grab bar and supervision for perineal, lower body, upper body  washing/drying, dressing all performed w/supervision and set up in sitting and standing. :All mobility on mat performed w/supervison only.  Pt shrinker removed, dressing changed, and clean shrinker applied by therapist.  Educated pt on how to wash/lay to dry and therapist performed.  Discussed need for daily change, pt states it has not been changed x 2 days.  wc propulsion mod I x 178ft but wc noted to continue pulling strongly to L.  Therapist obtained new wc/same size.  Pt able to propel 153ft mod I without difficulty steering/wc w/adequate alignment and ease of propulsion signficantly improved.  Therex: Seated LAQs 2x25 Supine SLR 2 x 30              TKEs 2x 30 Sidelying hip abd2 x 20                 Hip extension 2 x 20 Prone knee flexion 2x 25            Hip extesnion 2 x 25  Seated  press ups w/shoulder depression 2 x15 Seated trunk rotation reaching cross body to objects placed behind pt at max distance to work on core  strength and sitting balance.  At end of session, Pt left oob in wc w/alarm belt set and needs in reach  PM SESSION PAIN Pt seen this pm for 30 min session w/focus on functional mobility.    PT propelled wc to gym mod I.  Once arrived at gym pt asks "did she show you the picture of the stairs?"  Propels back to room so therapist can see home set up and better plan training for dc. Daughter provided photo via phone which shows enntrance to home w 3 wooden steps approx 5in ht and approx 23ft wide, no handrail, older in appearance/deck type steps  Steps lead to landing approx 3 ft-4 deep which turns 90degrees and pt then approx 6-9ft from doorway, level entrance.  Noted toenails very long and thick and discussed need for podiatrist care particularly in light of DM. Daughter provided shoe today, therapist donned for time management.   Gait 75ft x 2 w/RW and cga, cues for safe use of AD/tends to get too close to front of walker but no balance loss, daughter assisting by following w/wc, encouraging pt. Pt left oob in wc w/alarm belt set and needs in reach     Therapy Documentation Precautions:  Precautions Precautions: Fall Precaution Comments: R BKA Restrictions Weight  Bearing Restrictions: No RLE Weight Bearing: Non weight bearing    Therapy/Group: Individual Therapy  Rada Hay, PT   [name] 06/18/2020, 12:59 PM

## 2020-06-18 NOTE — Progress Notes (Signed)
Physical Therapy Session Note  Patient Details  Name: Ricky [name] MRN: [id number] Date of Birth: 1966/02/25  Today's Date: 06/18/2020 PT Individual Time: 6734-1937 PT Individual Time Calculation (min): 42 min   Short Term Goals: Week 1:  PT Short Term Goal 1 (Week 1): Pt will perform bed mobility with CGA without bed features PT Short Term Goal 2 (Week 1): Pt will perform bed<>chair transfers with minA and LRAD PT Short Term Goal 3 (Week 1): Pt will ambulate 4ft with minA and LRAD PT Short Term Goal 4 (Week 1): Pt will negotiate up/down x4 steps with minA and LRAD  Skilled Therapeutic Interventions/Progress Updates:    Patient received sitting up in wc, agreeable to PT. He denies pain. He was able to propel himself in wc to therapy gym with supervision. Patient able to negotiate 4 steps using modified shower bench to assist with ascending/descending. CGA needed for sit<> stand from shower bench and PT was needed to move bench up/down step for patient while he stood. Patient then completing dynamic sitting balance task with B UE and no back support. Trunk rotation for improved core activation as well. Patient requesting to return to room to use restroom. He was able to ambulate ~29ft into bathroom using RW and CGA. Supervision for clothing management and peri-hygiene. Patient ambulating ~64ft back to wc with RW and CGA. Seatbelt alarm on, call light within reach.   Therapy Documentation Precautions:  Precautions Precautions: Fall Precaution Comments: R BKA Restrictions Weight Bearing Restrictions: No RLE Weight Bearing: Non weight bearing    Therapy/Group: Individual Therapy  Westley Foots 06/18/2020, 7:45 AM
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
## 2020-06-19 LAB — GLUCOSE, CAPILLARY
Glucose-Capillary: 102 mg/dL — ABNORMAL HIGH (ref 70–99)
Glucose-Capillary: 110 mg/dL — ABNORMAL HIGH (ref 70–99)
Glucose-Capillary: 165 mg/dL — ABNORMAL HIGH (ref 70–99)
Glucose-Capillary: 152 mg/dL — ABNORMAL HIGH (ref 70–99)

## 2020-06-19 MED ORDER — INSULIN GLARGINE 100 UNIT/ML ~~LOC~~ SOLN
22.0000 [IU] | Freq: Every day | SUBCUTANEOUS | Status: DC
  Administered 2020-06-19 – 2020-06-23 (×5): 22 [IU] via SUBCUTANEOUS
  Filled 2020-06-19 (×6): qty 0.22

## 2020-06-19 NOTE — Progress Notes (Signed)
[facility] PHYSICAL MEDICINE & REHABILITATION PROGRESS NOTE   Subjective/Complaints: No complaints this morning. Denies pain, constipation, insomnia.  Drinking water Trying to eat more vegetables  ROS: Denies CP, SOB, N/V/D  Objective:   No results found. No results for input(s): WBC, HGB, HCT, PLT in the last 72 hours. Recent Labs    06/17/20 0443  NA 140  K 3.3*  CL 103  CO2 28  GLUCOSE 151*  BUN 12  CREATININE 1.35*  CALCIUM 8.9    Intake/Output Summary (Last 24 hours) at 06/19/2020 1053 Last data filed at 06/19/2020 0631 Gross per 24 hour  Intake --  Output 850 ml  Net -850 ml        Physical Exam: Vital Signs Blood pressure (!) 141/81, pulse 90, temperature 99.3 F (37.4 C), temperature source Oral, resp. rate 16, height 5\' 6"  (1.676 m), weight 71.9 kg, SpO2 97 %.  General: Alert and oriented x 3, No apparent distress HEENT: Head is normocephalic, atraumatic, PERRLA, EOMI, sclera anicteric, oral mucosa pink and moist, dentition intact, ext ear canals clear,  Neck: Supple without JVD or lymphadenopathy Heart: Reg rate and rhythm. No murmurs rubs or gallops Chest: CTA bilaterally without wheezes, rales, or rhonchi; no distress Abdomen: Soft, non-tender, non-distended, bowel sounds positive. Extremities: No clubbing, cyanosis, or edema. Pulses are 2+  Skin: Warm and dry.  Right BKA with dressing CDI.  Psych: Normal mood.  Normal behavior. Musc: Right BKA with edema and tenderness, improving Neuro: Alert Motor:  Left lower extremity: 5/5 proximal distal Right lower extremity: Hip flexion: 4/5 (some pain inhibition), improving  Assessment/Plan: 1. Functional deficits secondary to right BKA which require 3+ hours per day of interdisciplinary therapy in a comprehensive inpatient rehab setting.  Physiatrist is providing close team supervision and 24 hour management of active medical problems listed below.  Physiatrist and rehab team continue to assess  barriers to discharge/monitor patient progress toward functional and medical goals  Care Tool:  Bathing    Body parts bathed by patient: Right arm, Left arm, Chest, Abdomen, Front perineal area, Right upper leg, Left upper leg, Face, Buttocks, Left lower leg   Body parts bathed by helper: Buttocks, Right lower leg Body parts n/a: Right lower leg   Bathing assist Assist Level: Contact Guard/Touching assist     Upper Body Dressing/Undressing Upper body dressing   What is the patient wearing?: Pull over shirt    Upper body assist Assist Level: Set up assist    Lower Body Dressing/Undressing Lower body dressing      What is the patient wearing?: Pants     Lower body assist Assist for lower body dressing: Contact Guard/Touching assist     Toileting Toileting    Toileting assist Assist for toileting: Minimal Assistance - Patient > 75%     Transfers Chair/bed transfer  Transfers assist     Chair/bed transfer assist level: Contact Guard/Touching assist     Locomotion Ambulation   Ambulation assist      Assist level: Contact Guard/Touching assist Assistive device: Walker-rolling Max distance: 10   Walk 10 feet activity   Assist     Assist level: Contact Guard/Touching assist Assistive device: Walker-rolling   Walk 50 feet activity   Assist Walk 50 feet with 2 turns activity did not occur: Safety/medical concerns (fatigue)  Assist level: Contact Guard/Touching assist Assistive device: Walker-rolling    Walk 150 feet activity   Assist Walk 150 feet activity did not occur: Safety/medical concerns  Walk 10 feet on uneven surface  activity   Assist Walk 10 feet on uneven surfaces activity did not occur: Safety/medical concerns         Wheelchair     Assist Will patient use wheelchair at discharge?: Yes Type of Wheelchair: Manual    Wheelchair assist level: Supervision/Verbal cueing Max wheelchair distance: 150'     Wheelchair 50 feet with 2 turns activity    Assist        Assist Level: Supervision/Verbal cueing   Wheelchair 150 feet activity     Assist      Assist Level: Supervision/Verbal cueing   Blood pressure (!) 141/81, pulse 90, temperature 99.3 F (37.4 C), temperature source Oral, resp. rate 16, height 5\' 6"  (1.676 m), weight 71.9 kg, SpO2 97 %.  Medical Problem List and Plan: 1.Decreased functional abilitysecondary to right BKA 06/08/2020 due to necrotizing fasciitis.   Continue CIR 2. Antithrombotics: -DVT/anticoagulation:Subcutaneous heparin -antiplatelet therapy: N/A 3. Pain Management:Oxycodone as needed  Controlled with meds 11/6 4. Mood:Provide emotional support -antipsychotic agents: N/A 5. Neuropsych: This patientiscapable of making decisions on hisown behalf. 6. Skin/Wound Care:              Continue stump shrinker 7. Fluids/Electrolytes/Nutrition:Routine in and outs. 8.  CKD:   Creatinine 1.35 on 11/4, encouraged hydration- has been trying to drink more.  9. Diabetes mellitus peripheral neuropathy. Hemoglobin A1c 14.5. Lantus insulin 30 units daily. Diabetic teaching  Lantus 10 nightly started on 11/1, increased to 20 on 11/3  Increase Lantus to 21 U 11/5  11/6: Increase lantus to 22U for CBGs in 200s  Monitor with increased mobility 10. Hypertension/sinus tachycardia.   Lisinopril 10 mg daily, increased to 20 on 11/1  HCTZ 12.5 mg daily, Norvasc 10 mg daily.  ECG showing sinus tachycardia  Metoprolol 12.5 twice daily started on 11/3  BP Slightly elevated 11/6  HR relatively controlled on 11/4  Monitor with increased mobility 11. 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 Patient reportedly independent prior to admission. 12. Hyperlipidemia. Resume Lipitor 13. ID: wound cx negative.    Completed course of vanc and zosyn.     WBCs 11.9 on 11/1  Afebrile 14.   Hypokalemia  Potassium 3.3 on 11/4  Supplement x3 again 15.?  Anemia of chronic disease  Hemoglobin 11.8 on 11/1  Continue to monitor   LOS: 8 days A FACE TO FACE EVALUATION WAS PERFORMED  13/1 Delmus Warwick 06/19/2020, 10:53 AM

## 2020-06-20 ENCOUNTER — Inpatient Hospital Stay (HOSPITAL_COMMUNITY): Payer: Self-pay | Admitting: Physical Therapy

## 2020-06-20 ENCOUNTER — Encounter (HOSPITAL_COMMUNITY): Payer: Self-pay | Admitting: Occupational Therapy

## 2020-06-20 ENCOUNTER — Inpatient Hospital Stay (HOSPITAL_COMMUNITY): Payer: Self-pay | Admitting: Occupational Therapy

## 2020-06-20 LAB — GLUCOSE, CAPILLARY
Glucose-Capillary: 72 mg/dL (ref 70–99)
Glucose-Capillary: 93 mg/dL (ref 70–99)
Glucose-Capillary: 111 mg/dL — ABNORMAL HIGH (ref 70–99)
Glucose-Capillary: 94 mg/dL (ref 70–99)

## 2020-06-20 NOTE — Progress Notes (Signed)
Occupational Therapy Session Note  Patient Details  Name: Kewon Statler MRN: [id number] Date of Birth: 1965-08-23  Today's Date: 06/20/2020 OT Group Time: 1100-1200 OT Group Time Calculation (min): 60 min   Skilled Therapeutic Interventions/Progress Updates:    Pt engaged in therapeutic w/c level dance group focusing on patient choice, UE/LE strengthening, salience, activity tolerance, and social participation. Pt was guided through various dance-based exercises involving UEs/LEs and trunk. All music was selected by group members. Emphasis placed on UB strengthening and endurance. Pt participated well during group, requested music, and actively worked on extending knee of residual limb. At end of session pt was returned to room by OT.    Therapy Documentation Precautions:  Precautions Precautions: Fall Precaution Comments: R BKA Restrictions Weight Bearing Restrictions: Yes RLE Weight Bearing: Non weight bearing Pain: no s/s pain during tx Pain Assessment Pain Scale: 0-10 Pain Score: 0-No pain Faces Pain Scale: No hurt Pain Type: Acute pain Pain Location: Leg Pain Orientation: Right Pain Frequency: Intermittent Pain Onset: On-going Patients Stated Pain Goal: 2 Pain Intervention(s): Medication (See eMAR) ADL: ADL Grooming: Setup Where Assessed-Grooming: Sitting at sink Upper Body Bathing: Minimal assistance Where Assessed-Upper Body Bathing: Sitting at sink Lower Body Bathing: Minimal assistance Where Assessed-Lower Body Bathing: Standing at sink, Sitting at sink Upper Body Dressing: Minimal assistance Where Assessed-Upper Body Dressing: Sitting at sink Lower Body Dressing: Moderate assistance Where Assessed-Lower Body Dressing: Sitting at sink, Standing at sink Toilet Transfer: Minimal assistance Toilet Transfer Method: Squat pivot Toilet Transfer Equipment: Drop arm bedside commode      Therapy/Group: Group Therapy  Mitsuru Dault A Lorik Guo 06/20/2020, 12:43 PM

## 2020-06-20 NOTE — Progress Notes (Signed)
Physical Therapy Session Note  Patient Details  Name: Ricky [name] MRN: [id number] Date of Birth: Apr 05, 1966  Today's Date: 06/20/2020 PT Individual Time: 1305-1405 PT Individual Time Calculation (min): 60 min   Short Term Goals: Week 1:  PT Short Term Goal 1 (Week 1): Pt will perform bed mobility with CGA without bed features PT Short Term Goal 2 (Week 1): Pt will perform bed<>chair transfers with minA and LRAD PT Short Term Goal 3 (Week 1): Pt will ambulate 50ft with minA and LRAD PT Short Term Goal 4 (Week 1): Pt will negotiate up/down x4 steps with minA and LRAD  Skilled Therapeutic Interventions/Progress Updates: Pt presented in bed in prone agreeable to therapy. Pt states some mild pain in RLE 5/10. Performed bed mobility with supervision and use of bed features. Pt performed squat pivot transfer to w/c with CGA and PTA stabilizing w/c. Pt propelled to day room and performed squat pivot to mat in same manner as prior. Participated in x 2 bouts of cornhole in standing for increased standing tolerance and dynamic balance. After brief seated rest pt then participated in standing hip flexion, hip extension 2 x 10. Pt then transitioned to sidelying and participated in hip abd/add 2 x 10. In prone pt performed hip extension and knee flexion/extension with emphasis on distal aspect of residual leg touching mat. Pt then returned to sitting and performed ball taps with 3lb dowel 2 x 10 and shoulder flexion to 90 with 3lb dowel 2 x 10. Pt then transferred to w/c and performed triceps extension 2 x 10. Pt then ambulated 18ft with RW and CGA with noted decreased L foot clearance with fatigue however no catching of foot on floor. Pt then transported remaining distance back to room and requested to use bathroom. Performed ambulatory transfer approx 69ft to toilet and was CGA to toilet transfer. Pt left at toilet verbalizing understanding to pull cord once completed and [name], NT notified of pt's  disposition.      Therapy Documentation Precautions:  Precautions Precautions: Fall Precaution Comments: R BKA Restrictions Weight Bearing Restrictions: Yes RLE Weight Bearing: Non weight bearing General:   Vital Signs: Therapy Vitals Temp: 98.8 F (37.1 C) Pulse Rate: 99 Resp: 19 BP: (!) 148/84 Patient Position (if appropriate): Lying Oxygen Therapy SpO2: 100 % O2 Device: Room Air Pain:     Therapy/Group: Individual Therapy  Maybel Dambrosio  Teng Decou, PTA  06/20/2020, 3:46 PM

## 2020-06-20 NOTE — Progress Notes (Signed)
[place] PHYSICAL MEDICINE & REHABILITATION PROGRESS NOTE   Subjective/Complaints: No complaints this morning. Denies pain, constipation, insomnia.   ROS: Denies CP, SOB, N/V/D  Objective:   No results found. No results for input(s): WBC, HGB, HCT, PLT in the last 72 hours. No results for input(s): NA, K, CL, CO2, GLUCOSE, BUN, CREATININE, CALCIUM in the last 72 hours.  Intake/Output Summary (Last 24 hours) at 06/20/2020 1312 Last data filed at 06/20/2020 1100 Gross per 24 hour  Intake 240 ml  Output 800 ml  Net -560 ml    Physical Exam: Vital Signs Blood pressure 113/68, pulse 89, temperature 98.3 F (36.8 C), temperature source Oral, resp. rate 16, height 5\' 6"  (1.676 m), weight 71.9 kg, SpO2 97 %.   General: Alert and oriented x 3, No apparent distress HEENT: Head is normocephalic, atraumatic, PERRLA, EOMI, sclera anicteric, oral mucosa pink and moist, dentition intact, ext ear canals clear,  Neck: Supple without JVD or lymphadenopathy Heart: Reg rate and rhythm. No murmurs rubs or gallops Chest: CTA bilaterally without wheezes, rales, or rhonchi; no distress Abdomen: Soft, non-tender, non-distended, bowel sounds positive. Extremities: No clubbing, cyanosis, or edema. Pulses are 2+  Skin: Warm and dry.  Right BKA with dressing CDI.  Psych: Normal mood.  Normal behavior. Musc: Right BKA with edema and tenderness, improving Neuro: Alert Motor:  Left lower extremity: 5/5 proximal distal Right lower extremity: Hip flexion: 4/5 (some pain inhibition), improving  Assessment/Plan: 1. Functional deficits secondary to right BKA which require 3+ hours per day of interdisciplinary therapy in a comprehensive inpatient rehab setting.  Physiatrist is providing close team supervision and 24 hour management of active medical problems listed below.  Physiatrist and rehab team continue to assess barriers to discharge/monitor patient progress toward functional and medical  goals  Care Tool:  Bathing    Body parts bathed by patient: Right arm, Left arm, Chest, Abdomen, Front perineal area, Right upper leg, Left upper leg, Face, Buttocks, Left lower leg   Body parts bathed by helper: Buttocks, Right lower leg Body parts n/a: Right lower leg   Bathing assist Assist Level: Contact Guard/Touching assist     Upper Body Dressing/Undressing Upper body dressing   What is the patient wearing?: Pull over shirt    Upper body assist Assist Level: Set up assist    Lower Body Dressing/Undressing Lower body dressing      What is the patient wearing?: Pants     Lower body assist Assist for lower body dressing: Contact Guard/Touching assist     Toileting Toileting    Toileting assist Assist for toileting: Minimal Assistance - Patient > 75%     Transfers Chair/bed transfer  Transfers assist     Chair/bed transfer assist level: Contact Guard/Touching assist     Locomotion Ambulation   Ambulation assist      Assist level: Contact Guard/Touching assist Assistive device: Walker-rolling Max distance: 10   Walk 10 feet activity   Assist     Assist level: Contact Guard/Touching assist Assistive device: Walker-rolling   Walk 50 feet activity   Assist Walk 50 feet with 2 turns activity did not occur: Safety/medical concerns (fatigue)  Assist level: Contact Guard/Touching assist Assistive device: Walker-rolling    Walk 150 feet activity   Assist Walk 150 feet activity did not occur: Safety/medical concerns         Walk 10 feet on uneven surface  activity   Assist Walk 10 feet on uneven surfaces activity did not occur:  Safety/medical concerns         Wheelchair     Assist Will patient use wheelchair at discharge?: Yes Type of Wheelchair: Manual    Wheelchair assist level: Supervision/Verbal cueing Max wheelchair distance: 150'    Wheelchair 50 feet with 2 turns activity    Assist        Assist  Level: Supervision/Verbal cueing   Wheelchair 150 feet activity     Assist      Assist Level: Supervision/Verbal cueing   Blood pressure 113/68, pulse 89, temperature 98.3 F (36.8 C), temperature source Oral, resp. rate 16, height 5\' 6"  (1.676 m), weight 71.9 kg, SpO2 97 %.  Medical Problem List and Plan: 1.Decreased functional abilitysecondary to right BKA 06/08/2020 due to necrotizing fasciitis.   Contiune CIR 2. Antithrombotics: -DVT/anticoagulation:Subcutaneous heparin -antiplatelet therapy: N/A 3. Pain Management:Oxycodone as needed  Controlled with meds 11/7 4. Mood:Provide emotional support -antipsychotic agents: N/A 5. Neuropsych: This patientiscapable of making decisions on hisown behalf. 6. Skin/Wound Care:              Continue stump shrinker 7. Fluids/Electrolytes/Nutrition:Routine in and outs. 8.  CKD:   Creatinine 1.35 on 11/4, encouraged hydration- has been trying to drink more.  9. Diabetes mellitus peripheral neuropathy. Hemoglobin A1c 14.5. Lantus insulin 30 units daily. Diabetic teaching  Lantus 10 nightly started on 11/1, increased to 20 on 11/3  Increase Lantus to 21 U 11/5  11/6: Increase lantus to 22U for CBGs in 200s  11/7: better controlled- high of 165.   Monitor with increased mobility 10. Hypertension/sinus tachycardia.   Lisinopril 10 mg daily, increased to 20 on 11/1  HCTZ 12.5 mg daily, Norvasc 10 mg daily.  ECG showing sinus tachycardia  Metoprolol 12.5 twice daily started on 11/3  BP Slightly elevated 11/6  HR well controlled 11/7  Monitor with increased mobility 11. 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 Patient reportedly independent prior to admission. 12. Hyperlipidemia. Resume Lipitor 13. ID: wound cx negative.    Completed course of vanc and zosyn.     WBCs 11.9 on 11/1  Afebrile 14.  Hypokalemia  Potassium 3.3 on 11/4  Supplement x3 again 15.?   Anemia of chronic disease  Hemoglobin 11.8 on 11/1  Continue to monitor   LOS: 9 days A FACE TO FACE EVALUATION WAS PERFORMED  Ricky [name] Ricky [name] 06/20/2020, 1:12 PM

## 2020-06-21 ENCOUNTER — Inpatient Hospital Stay (HOSPITAL_COMMUNITY): Payer: Self-pay | Admitting: Occupational Therapy

## 2020-06-21 ENCOUNTER — Inpatient Hospital Stay (HOSPITAL_COMMUNITY): Payer: Self-pay

## 2020-06-21 LAB — GLUCOSE, CAPILLARY
Glucose-Capillary: 126 mg/dL — ABNORMAL HIGH (ref 70–99)
Glucose-Capillary: 127 mg/dL — ABNORMAL HIGH (ref 70–99)
Glucose-Capillary: 146 mg/dL — ABNORMAL HIGH (ref 70–99)
Glucose-Capillary: 166 mg/dL — ABNORMAL HIGH (ref 70–99)

## 2020-06-21 NOTE — Progress Notes (Signed)
[place] PHYSICAL MEDICINE & REHABILITATION PROGRESS NOTE   Subjective/Complaints: Feeling well. No new issues. Pain is controlled  ROS: Patient denies fever, rash, sore throat, blurred vision, nausea, vomiting, diarrhea, cough, shortness of breath or chest pain, joint or back pain, headache, or mood change.    Objective:   No results found. No results for input(s): WBC, HGB, HCT, PLT in the last 72 hours. No results for input(s): NA, K, CL, CO2, GLUCOSE, BUN, CREATININE, CALCIUM in the last 72 hours.  Intake/Output Summary (Last 24 hours) at 06/21/2020 1250 Last data filed at 06/21/2020 1045 Gross per 24 hour  Intake 380 ml  Output 1250 ml  Net -870 ml    Physical Exam: Vital Signs Blood pressure (!) 143/88, pulse 93, temperature 98.2 F (36.8 C), temperature source Oral, resp. rate 16, height 5\' 6"  (1.676 m), weight 71.9 kg, SpO2 96 %.   Constitutional: No distress . Vital signs reviewed. HEENT: EOMI, oral membranes moist Neck: supple Cardiovascular: RRR without murmur. No JVD    Respiratory/Chest: CTA Bilaterally without wheezes or rales. Normal effort    GI/Abdomen: BS +, non-tender, non-distended Ext: no clubbing, cyanosis, or edema Psych: pleasant and cooperative Skin: Warm and dry.  Right BKA incision CDI, minimal s/s drainage along medial aspect of wound. Wearing shrinker Musc: Right BKA with distal edema and tenderness, improving Neuro: Alert Motor:  Left lower extremity: 5/5 proximal distal Right lower extremity: Hip flexion: 4/5 (some pain inhibition), improving  Assessment/Plan: 1. Functional deficits secondary to right BKA which require 3+ hours per day of interdisciplinary therapy in a comprehensive inpatient rehab setting.  Physiatrist is providing close team supervision and 24 hour management of active medical problems listed below.  Physiatrist and rehab team continue to assess barriers to discharge/monitor patient progress toward functional and  medical goals  Care Tool:  Bathing    Body parts bathed by patient: Right arm, Left arm, Chest, Abdomen, Front perineal area, Right upper leg, Left upper leg, Face, Buttocks, Left lower leg   Body parts bathed by helper: Buttocks, Right lower leg Body parts n/a: Right lower leg   Bathing assist Assist Level: Supervision/Verbal cueing     Upper Body Dressing/Undressing Upper body dressing   What is the patient wearing?: Pull over shirt    Upper body assist Assist Level: Set up assist    Lower Body Dressing/Undressing Lower body dressing      What is the patient wearing?: Pants     Lower body assist Assist for lower body dressing: Supervision/Verbal cueing     Toileting Toileting    Toileting assist Assist for toileting: Minimal Assistance - Patient > 75%     Transfers Chair/bed transfer  Transfers assist     Chair/bed transfer assist level: Contact Guard/Touching assist     Locomotion Ambulation   Ambulation assist      Assist level: Contact Guard/Touching assist Assistive device: Walker-rolling Max distance: 74ft   Walk 10 feet activity   Assist     Assist level: Contact Guard/Touching assist Assistive device: Walker-rolling   Walk 50 feet activity   Assist Walk 50 feet with 2 turns activity did not occur: Safety/medical concerns (fatigue)  Assist level: Contact Guard/Touching assist Assistive device: Walker-rolling    Walk 150 feet activity   Assist Walk 150 feet activity did not occur: Safety/medical concerns         Walk 10 feet on uneven surface  activity   Assist Walk 10 feet on uneven surfaces activity did  not occur: Safety/medical concerns         Wheelchair     Assist Will patient use wheelchair at discharge?: Yes Type of Wheelchair: Manual    Wheelchair assist level: Supervision/Verbal cueing Max wheelchair distance: 150'    Wheelchair 50 feet with 2 turns activity    Assist        Assist  Level: Supervision/Verbal cueing   Wheelchair 150 feet activity     Assist      Assist Level: Supervision/Verbal cueing   Blood pressure (!) 143/88, pulse 93, temperature 98.2 F (36.8 C), temperature source Oral, resp. rate 16, height 5\' 6"  (1.676 m), weight 71.9 kg, SpO2 96 %.  Medical Problem List and Plan: 1.Decreased functional abilitysecondary to right BKA 06/08/2020 due to necrotizing fasciitis.   Contiune CIR PT, OT 2. Antithrombotics: -DVT/anticoagulation:Subcutaneous heparin -antiplatelet therapy: N/A 3. Pain Management:Oxycodone as needed  Controlled with meds 11/8 4. Mood:Provide emotional support -antipsychotic agents: N/A 5. Neuropsych: This patientiscapable of making decisions on hisown behalf. 6. Skin/Wound Care:              11/8:Continue stump shrinker, telfa over incision   -can triple layer shrinker over distal portion of stump to help better control distal edema 7. Fluids/Electrolytes/Nutrition:Routine in and outs. 8.  CKD:   Creatinine 1.35 on 11/4, encouraged hydration- has been trying to drink more.  9. Diabetes mellitus peripheral neuropathy. Hemoglobin A1c 14.5. Lantus insulin 30 units daily. Diabetic teaching  Lantus 10 nightly started on 11/1, increased to 20 on 11/3  Increase Lantus to 21 U 11/5  11/6: Increase lantus to 22U for CBGs in 200s  11/8 improved glucose control  Monitor with increased mobility 10. Hypertension/sinus tachycardia.   Lisinopril 10 mg daily, increased to 20 on 11/1  HCTZ 12.5 mg daily, Norvasc 10 mg daily.  ECG showing sinus tachycardia  Metoprolol 12.5 twice daily started on 11/3  BP Slightly elevated 11/6  HR under fair control 11/8    11. 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 Patient reportedly independent prior to admission. 12. Hyperlipidemia. Resume Lipitor 13. ID: wound cx negative.    Completed course of vanc and zosyn.     WBCs  11.9 on 11/1  Afebrile 14.  Hypokalemia  Potassium 3.3 on 11/4  Supplemented x3 again 15.?  Anemia of chronic disease  Hemoglobin 11.8 on 11/1  Continue to monitor   LOS: 10 days A FACE TO FACE EVALUATION WAS PERFORMED  13/1 06/21/2020, 12:50 PM

## 2020-06-21 NOTE — Plan of Care (Signed)
  Problem: RH Dressing Goal: LTG Patient will perform lower body dressing w/assist (OT) Description: LTG: Patient will perform lower body dressing with assist, with/without cues in positioning using equipment (OT) Flowsheets (Taken 06/21/2020 1010) LTG: Pt will perform lower body dressing with assistance level of: Supervision/Verbal cueing Note: Downgraded to reflect consistency with supervision for sit > stand and dynamic standing balance goals

## 2020-06-21 NOTE — Plan of Care (Signed)
  Problem: RH Ambulation Goal: LTG Patient will ambulate in controlled environment (PT) Description: LTG: Patient will ambulate in a controlled environment, # of feet with assistance (PT). Flowsheets (Taken 06/21/2020 1258) LTG: Pt will ambulate in controlled environ  assist needed:: (downgraded due to R BKA, decreased balance, and generalized weakness) Supervision/Verbal cueing LTG: Ambulation distance in controlled environment: 12ft with LRAD Note: downgraded due to R BKA, decreased balance, and generalized weakness Goal: LTG Patient will ambulate in home environment (PT) Description: LTG: Patient will ambulate in home environment, # of feet with assistance (PT). Flowsheets (Taken 06/21/2020 1258) LTG: Pt will ambulate in home environ  assist needed:: (downgraded due to R BKA, decreased balance, and generalized weaknessdowngraded due to R BKA, decreased balance, and generalized weakness) Supervision/Verbal cueing LTG: Ambulation distance in home environment: 67ft with LRAD Note: downgraded due to R BKA, decreased balance, and generalized weakness

## 2020-06-21 NOTE — Progress Notes (Signed)
Physical Therapy Weekly Progress Note  Patient Details  Name: Ricky [name] MRN: [id number] Date of Birth: Oct 21, 1965  Beginning of progress report period: June 12, 2020 End of progress report period: June 21, 2020  Patient has met 4 of 4 short term goals. Pt demonstrates good progress towards long term goals. Pt currently requires supervision for bed mobility, CGA for stand<>pivot transfers with RW and supervision for lateral scoot and squat<>pivot transfers, supervision for WC mobility up to 173f, CGA/min A to navigate 4 steps with 1 handrail using shower chair, and CGA to ambulate 610fwith RW. Pt continues to be limited by mild cognitive deficits, generalized weakness/endurance, and decreased balance/postural control.   Patient continues to demonstrate the following deficits muscle weakness, decreased cardiorespiratoy endurance and decreased standing balance, decreased postural control, decreased balance strategies and difficulty maintaining precautions and therefore will continue to benefit from skilled PT intervention to increase functional independence with mobility.  Patient progressing toward long term goals..  Continue plan of care.  PT Short Term Goals Week 1:  PT Short Term Goal 1 (Week 1): Pt will perform bed mobility with CGA without bed features PT Short Term Goal 1 - Progress (Week 1): Met PT Short Term Goal 2 (Week 1): Pt will perform bed<>chair transfers with minA and LRAD PT Short Term Goal 2 - Progress (Week 1): Met PT Short Term Goal 3 (Week 1): Pt will ambulate 5041fith minA and LRAD PT Short Term Goal 3 - Progress (Week 1): Met PT Short Term Goal 4 (Week 1): Pt will negotiate up/down x4 steps with minA and LRAD PT Short Term Goal 4 - Progress (Week 1): Met Week 2:  PT Short Term Goal 1 (Week 2): STG=LTG due to LOS  Skilled Therapeutic Interventions/Progress Updates:  Ambulation/gait training;DME/adaptive equipment instruction;UE/LE Strength  taining/ROM;Psychosocial support;UE/LE Coordination activities;Skin care/wound management;Balance/vestibular training;Functional electrical stimulation;Functional mobility training;Cognitive remediation/compensation;Splinting/orthotics;Visual/perceptual remediation/compensation;Wheelchair propulsion/positioning;Stair training;Neuromuscular re-education;Community reintegration;Discharge planning;Pain management;Therapeutic Activities;Therapeutic Exercise;Patient/family education;Disease management/prevention   Therapy Documentation Precautions:  Precautions Precautions: Fall Precaution Comments: R BKA Restrictions Weight Bearing Restrictions: Yes RLE Weight Bearing: Non weight bearing  Therapy/Group: Individual Therapy AnnAlfonse Alpers, DPT   06/21/2020, 7:37 AM

## 2020-06-21 NOTE — Progress Notes (Signed)
Physical Therapy Session Note  Patient Details  Name: Ricky [name] MRN: [id number] Date of Birth: September 23, 1965  Today's Date: 06/21/2020 PT Individual Time: 9373-4287 and 6811-5726 PT Individual Time Calculation (min): 39 min and 58 min  Short Term Goals: Week 1:  PT Short Term Goal 1 (Week 1): Pt will perform bed mobility with CGA without bed features PT Short Term Goal 1 - Progress (Week 1): Met PT Short Term Goal 2 (Week 1): Pt will perform bed<>chair transfers with minA and LRAD PT Short Term Goal 2 - Progress (Week 1): Met PT Short Term Goal 3 (Week 1): Pt will ambulate 87f with minA and LRAD PT Short Term Goal 3 - Progress (Week 1): Met PT Short Term Goal 4 (Week 1): Pt will negotiate up/down x4 steps with minA and LRAD PT Short Term Goal 4 - Progress (Week 1): Met Week 2:  PT Short Term Goal 1 (Week 2): STG=LTG due to LOS  Skilled Therapeutic Interventions/Progress Updates:  Treatment Session 1: 1115-1154 39 min Received pt sitting in WC, pt agreeable to therapy, and denied any pain during session but reported feeling fatigued and requesting to return to bed. With encouragement pt agreed to wait until end of session to return to bed. Session with emphasis on functional mobility/transfers, generalized strengthening, dynamic standing balance/coordination, ambulation, and improved activity tolerance. Pt requesting a power WC at discharge. Therapist informed pt that using a manual WC would allow pt to maintain BUE strength and endurance propelling WC and with pt's current mobility status of CGA and ability to ambulate with RW he does not need and would not qualify for one; pt verbalized understanding. Pt donned L shoe with supervision and [facility] purposes and ambulated 578fwith RW and CGA. Pt with decreased LLE foot clearance with fatigue. Pt transferred WC<>Nustep stand<>pivot with RW and CGA and performed BUE and LLE strengthening on  Nustep at workload 5 for 10 minutes for a total of 431 steps for improved cardiovascular endurance. Pt reported RPE 15/20 and required extensive rest break after activity. Pt performed the following exercises sitting in WC with supervision and verbal cues for technique: -hip flexion 2x10 bilaterally with 3lb ankle weight on LLE -LAQ 2x10 bilaterally with 3lb ankle weight on LLE -WC pushups 2x8 [facility] and transferred WC<>bed squat<>pivot with supervision and sit<>supine with supervision. Concluded session with pt supine in bed, needs within reach, and bed alarm on.   Treatment Session 2: [id number] 16in  Received pt supine in bed, pt agreeable to therapy, and denied any pain during session. Session with emphasis on functional mobility/transfers, generalized strengthening, dynamic standing balance/coordination, ambulation, community navigation, and improved activity tolerance. Pt performed bed mobility with supervision and donned L shoe with supervision sitting EOB. Pt transferred bed<>WC squat<>pivot with supervision and donned legrests with supervision and min cues. Donned WC gloves independently and pt [facility] outside [facility] energy [facility] purposes. Pt transferred sit<>stand with RW and supervision and ambulated 852fith RW and CGA/close supervision over uneven surfaces (concrete and up/down mild slopes). Pt performed WC mobility [id number] BUE and supervision weaving in and out of picnic tables along uneven brick surface. Pt required multiple rest breaks throughout session due to fatigue. Pt performed WC mobility additional 250ft57fr uneven concrete, up/down slopes, and along level ground, including through giftshop with emphasis on community navigation and navigating through narrow spaces  with supervision overall. Pt performed WC mobility onto/off elevator and to ortho gym and performed BUE strengthening on UBE at level 4 for  4 minutes forward and 4 minutes backwards with supervision. Pt transported back to room in [facility] total A and requested to use restroom. Pt ambulated 2f x 1 and 179fx 1 with RW and CGA to/from bathroom. Pt able to manage clothing standing with CGA and able to void and with medium sized BM (RN notified). Pt transferred sit<>stand with supervision and required total A for peri-care. Doffed L shoe with max A for time management purposes and transferred sit<>supine with supervision. Concluded session with pt supine in bed, needs within reach, and bed alarm on.    Therapy Documentation Precautions:  Precautions Precautions: Fall Precaution Comments: R BKA Restrictions Weight Bearing Restrictions: Yes RLE Weight Bearing: Non weight bearing  Therapy/Group: Individual Therapy AnAlfonse AlpersT, DPT   06/21/2020, 7:39 AM

## 2020-06-21 NOTE — Progress Notes (Signed)
Occupational Therapy Weekly Progress Note  Patient Details  Name: Ricky [name] MRN: [id number] Date of Birth: 1966/03/25  Beginning of progress report period: June 12, 2020 End of progress report period: June 21, 2020  Today's Date: 06/21/2020 OT Individual Time: 4665-9935 OT Individual Time Calculation (min): 75 min    Short term goals not set due to estimated length of stay.  Pt is making steady progress towards goals. Pt currently able to complete squat pivot transfers supervision and short distance ambulatory transfers with RW with CGA.  Pt completes bathing and dressing at sink side with setup assist and close supervision when standing for LB bathing and dressing.  Pt is able to don shrinker sock with increased time and is able to verbalize use of inspection mirror for limb inspection.  Pt is very motivated to complete self-care and functional mobility with increased independence, as he lives with his mother who can provide intermittent assistance.  Patient continues to demonstrate the following deficits: muscle weakness, decreased problem solving and decreased safety awareness and decreased sitting balance, decreased standing balance and decreased balance strategies and therefore will continue to benefit from skilled OT intervention to enhance overall performance with BADL and Reduce care partner burden.  See Patient's Care Plan for progression toward long term goals.  Patient progressing toward long term goals..  Plan of care revisions: downgraded LB dressing to Supervision to reflect consistency with Supervision sit > stand and dynamic standing goals.  Skilled Therapeutic Interventions/Progress Updates:    Treatment session with focus on self-care retraining, functional transfers, and dynamic standing balance.  Pt agreeable to therapy session, expressing desire to wash at sink because shower would be "too much".  Pt completed short distance ambulatory transfer to sink with RW  with CGA, therapist providing cues for hand placement during sit > stand.  Pt completed bathing with setup assist and close supervision when standing to wash buttocks. Pt tolerated standing 3-4 mins with close supervision while washing and pulling pants down prior to bathing while alternating UE support on sink and even leaning against sink for stability.  Pt completed dressing with supervision when standing to pull pants over hips and setup for UB dressing.  Pt able to don leg rests with increased time and min cues.  Therapist educated pt on covering residual limb for shower and tub/shower transfer technique with use of tub transfer bench with demonstration.  Pt completed ambulatory transfer with RW to tub bench with CGA, completed sit pivot transfer in to shower. Educated on lateral leans vs sit > stand for LB hygiene.  Completed stand pivot transfer to [facility] with RW with CGA.  Discussed drop arm BSC vs standard BSC with recommendation for drop arm as pt balance and energy level fluctuates, drop arm allowing for him to safely complete squat pivot transfer.  Engaged in 10 min on UBE on resistance level 7 with 5 mins forward and 5 mins backward for strengthening and endurance.  Pt required rest break between forward and backward sets.  Returned to room and remained upright in w/c with seat belt alarm on and all needs in reach.  Therapy Documentation Precautions:  Precautions Precautions: Fall Precaution Comments: R BKA Restrictions Weight Bearing Restrictions: Yes RLE Weight Bearing: Non weight bearing Pain: Pain Assessment Pain Scale: 0-10 Pain Score: 0-No pain Faces Pain Scale: No hurt Pain Type: Acute pain Pain Frequency: Intermittent Pain Onset: On-going Patients Stated Pain Goal: 2 Pain Intervention(s): Medication (See eMAR) Multiple Pain Sites: No   Therapy/Group: Individual  Therapy  Rosalio Loud 06/21/2020, 9:16 AM

## 2020-06-22 ENCOUNTER — Inpatient Hospital Stay (HOSPITAL_COMMUNITY): Payer: Self-pay

## 2020-06-22 ENCOUNTER — Inpatient Hospital Stay (HOSPITAL_COMMUNITY): Payer: Self-pay | Admitting: Physical Therapy

## 2020-06-22 ENCOUNTER — Inpatient Hospital Stay (HOSPITAL_COMMUNITY): Payer: Self-pay | Admitting: Occupational Therapy

## 2020-06-22 LAB — GLUCOSE, CAPILLARY
Glucose-Capillary: 156 mg/dL — ABNORMAL HIGH (ref 70–99)
Glucose-Capillary: 125 mg/dL — ABNORMAL HIGH (ref 70–99)
Glucose-Capillary: 121 mg/dL — ABNORMAL HIGH (ref 70–99)
Glucose-Capillary: 217 mg/dL — ABNORMAL HIGH (ref 70–99)

## 2020-06-22 MED ORDER — POLYETHYLENE GLYCOL 3350 17 G PO PACK
17.0000 g | PACK | Freq: Every day | ORAL | Status: DC
  Administered 2020-06-22 – 2020-06-24 (×3): 17 g via ORAL
  Filled 2020-06-22 (×3): qty 1

## 2020-06-22 NOTE — Progress Notes (Signed)
Occupational Therapy Session Note  Patient Details  Name: Ricky [name] MRN: [id number] Date of Birth: [birth year]  Today's Date: 06/22/2020 OT Individual Time: 2440-1027 OT Individual Time Calculation (min): 58 min    Short Term Goals: No short term goals set  Skilled Therapeutic Interventions/Progress Updates:    Treatment session with focus on self-care retraining, functional transfers, dynamic standing balance, and BUE strengthening.  Pt received upright in bed agreeable to therapy session.  Completed squat pivot transfer bed > w/c with supervision.  Pt propelled w/c to sink to engage in bathing and dressing tasks.  Pt completed bathing at sit > stand level with supervision for standing balance, pt demonstrating improved standing balance with use of counter tops for stability during LB bathing and dressing.  Pt able to doff and don shrinker sock with good awareness.  Pt able to don leg rests with increased time, but no cues.  Therapist provided pt with HEP for BUE strengthening and endurance with green theraband.  Pt able to complete each exercise with multimodal cues and able to utilize handout for recall of each exercise.  Pt remained upright in w/c with all needs in reach and seat belt alarm on.  Therapy Documentation Precautions:  Precautions Precautions: Fall Precaution Comments: R BKA Restrictions Weight Bearing Restrictions: Yes RLE Weight Bearing: Non weight bearing General:   Vital Signs:   Pain: Pain Assessment Pain Scale: 0-10 Pain Score: 0-No pain Faces Pain Scale: No hurt Pain Type: Acute pain Pain Location: Leg Pain Orientation: Right Pain Frequency: Intermittent Pain Onset: On-going Patients Stated Pain Goal: 2 Pain Intervention(s): Medication (See eMAR) Multiple Pain Sites: No   Therapy/Group: Individual Therapy  Rosalio Loud 06/22/2020, 9:08 AM

## 2020-06-22 NOTE — Progress Notes (Signed)
Patient ID: Ricky [name], male   DOB: February 06, 1966, 54 y.o.   MRN: [id number] Reached out to charity home [facility] this week[facility] and they do not service [place] so he will not receive any home health at discharge. Will let team and MD know.

## 2020-06-22 NOTE — Progress Notes (Signed)
Physical Therapy Session Note  Patient Details  Name: Ricky [name] MRN: [id number] Date of Birth: 06/17/1966  Today's Date: 06/22/2020 PT Individual Time: 0903-0956 PT Individual Time Calculation (min): 53 min   Short Term Goals: Week 1:  PT Short Term Goal 1 (Week 1): Pt will perform bed mobility with CGA without bed features PT Short Term Goal 1 - Progress (Week 1): Met PT Short Term Goal 2 (Week 1): Pt will perform bed<>chair transfers with minA and LRAD PT Short Term Goal 2 - Progress (Week 1): Met PT Short Term Goal 3 (Week 1): Pt will ambulate 50ft with minA and LRAD PT Short Term Goal 3 - Progress (Week 1): Met PT Short Term Goal 4 (Week 1): Pt will negotiate up/down x4 steps with minA and LRAD PT Short Term Goal 4 - Progress (Week 1): Met Week 2:  PT Short Term Goal 1 (Week 2): STG=LTG due to LOS  Skilled Therapeutic Interventions/Progress Updates:   Received pt sitting in WC with RN present administering medications. Pt agreeable to therapy, and denied any pain during session. Session with emphasis on functional mobility/transfers, generalized strengthening, dynamic standing balance/coordination, ambulation, stair navigation, and improved activity tolerance. Donned WC gloves independently and L shoe with set up assist and pt performed WC mobility 150ft x 1 trial and 40ft x 1 trial using BUE and supervision to therapy gym. Pt able to perform WC parts management with min/mod A due to sticky legrest. Pt transferred sit<>stand with RW and supervision and transferred onto shower chair with close supervision. Pt navigated 3 steps with L handrail and CGA with total A to manage shower chair with cues for technique and hand placement on rail and on shower chair for stability. Pt ambulated 78ft with RW and close supervision with cues for energy conservation strategies and to avoid compressive forces on L knee. Squat<>pivot WC<>mat and sit<>prone with supervision. Pt performed the following  exercises on mat with supervision and verbal cues for technique: -prone hip extension 2x10 bilaterally -prone LLE hamstring curl with 4lb ankle weight 2x10 with cues for eccentric control  -prone<>prone press up on extended elbows 2x8 -supine LLE single leg bridges 2x8 -L sidelying R hip abduction 2x10 -L sidelying R hip extension 2x10 L sidelying<>sitting on mat with supervision and mat<>WC squat<>pivot with supervision. Pt transported back to room in WC total A. Concluded session with pt sitting in WC, needs within reach, and seatbelt alarm on.   Therapy Documentation Precautions:  Precautions Precautions: Fall Precaution Comments: R BKA Restrictions Weight Bearing Restrictions: Yes RLE Weight Bearing: Non weight bearing  Therapy/Group: Individual Therapy Anna M Johnson  Anna Johnson PT, DPT   06/22/2020, 7:21 AM  
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
## 2020-06-22 NOTE — Progress Notes (Signed)
Physical Therapy Session Note  Patient Details  Name: Ricky [name] MRN: [id number] Date of Birth: 01-31-1966  Today's Date: 06/22/2020 PT Individual Time: 1400-1515 PT Individual Time Calculation (min): 75 min   Short Term Goals: Week 2:  PT Short Term Goal 1 (Week 2): STG=LTG due to LOS  Skilled Therapeutic Interventions/Progress Updates:    Pt received seated in w/c in room, agreeable to PT session. No complaints of pain. Session focus on w/c mobility in functional environment navigating hospital hallways, elevators, outdoors up/down inclines and across uneven surfaces at Supervision level. Pt able to propel up to 500 ft before onset of fatigue. Reviewed management of w/c parts. Seated BUE endurance task performing 4# weighted dowel volleyball, 3 x 30 reps to fatigue. Sit to stand with Supervision to RW. Ambulation x 100 ft outdoors across unlevel ground and up/down inclines with RW and CGA for balance. Pt demonstrates good safety awareness and response to obstacles encountered during gait. Squat pivot transfer w/c to/from mat table at Supervision level. Supine to/from sit on flat mat table with Supervision. Supine and sidelying RLE strengthening therex: SLR, hip abd, quad set, hip abd against gravity x 10-15 reps each. Pt returned to bed at end of session, Supervision for squat pivot transfer back to bed. Pt is at Supervision level for bed mobility. Pt left seated in bed with needs in reach, bed alarm in place at end of session.  Therapy Documentation Precautions:  Precautions Precautions: Fall Precaution Comments: R BKA Restrictions Weight Bearing Restrictions: Yes RLE Weight Bearing: Non weight bearing   Therapy/Group: Individual Therapy   Peter Congo, PT, DPT  06/22/2020, 3:59 PM

## 2020-06-22 NOTE — Progress Notes (Signed)
[place] PHYSICAL MEDICINE & REHABILITATION PROGRESS NOTE   Subjective/Complaints: Patient seen sitting up in his chair working with therapy this morning.  He states he slept well overnight.  He is able to don/doff his shrinker.  Discussed carryover with therapies-improving.  ROS: Denies CP, SOB, N/V/D  Objective:   No results found. No results for input(s): WBC, HGB, HCT, PLT in the last 72 hours. No results for input(s): NA, K, CL, CO2, GLUCOSE, BUN, CREATININE, CALCIUM in the last 72 hours.  Intake/Output Summary (Last 24 hours) at 06/22/2020 1006 Last data filed at 06/22/2020 0758 Gross per 24 hour  Intake 620 ml  Output 750 ml  Net -130 ml    Physical Exam: Vital Signs Blood pressure 134/87, pulse 93, temperature 98.7 F (37.1 C), temperature source Oral, resp. rate 18, height 5\' 6"  (1.676 m), weight 71.9 kg, SpO2 98 %.  Constitutional: No distress . Vital signs reviewed. HENT: Normocephalic.  Atraumatic. Eyes: EOMI. No discharge. Cardiovascular: No JVD.  RRR. Respiratory: Normal effort.  No stridor.  Bilateral clear to auscultation. GI: Non-distended.  BS +. Skin: Warm and dry.  Right BKA with staples CDI, no drainage. Psych: Normal mood.  Normal behavior. Musc: Right BKA with edema and tenderness, improving Neuro: Alert Motor:  Left lower extremity: 5/5 proximal distal Right lower extremity: Hip flexion: 4/5 (some pain inhibition), improving  Assessment/Plan: 1. Functional deficits secondary to right BKA which require 3+ hours per day of interdisciplinary therapy in a comprehensive inpatient rehab setting.  Physiatrist is providing close team supervision and 24 hour management of active medical problems listed below.  Physiatrist and rehab team continue to assess barriers to discharge/monitor patient progress toward functional and medical goals  Care Tool:  Bathing    Body parts bathed by patient: Right arm, Left arm, Chest, Abdomen, Front perineal area,  Right upper leg, Left upper leg, Face, Buttocks, Left lower leg   Body parts bathed by helper: Buttocks, Right lower leg Body parts n/a: Right lower leg   Bathing assist Assist Level: Supervision/Verbal cueing     Upper Body Dressing/Undressing Upper body dressing   What is the patient wearing?: Pull over shirt    Upper body assist Assist Level: Independent with assistive device    Lower Body Dressing/Undressing Lower body dressing      What is the patient wearing?: Pants     Lower body assist Assist for lower body dressing: Supervision/Verbal cueing     Toileting Toileting    Toileting assist Assist for toileting: Minimal Assistance - Patient > 75%     Transfers Chair/bed transfer  Transfers assist     Chair/bed transfer assist level: Supervision/Verbal cueing     Locomotion Ambulation   Ambulation assist      Assist level: Supervision/Verbal cueing Assistive device: Walker-rolling Max distance: 44ft   Walk 10 feet activity   Assist     Assist level: Supervision/Verbal cueing Assistive device: Walker-rolling   Walk 50 feet activity   Assist Walk 50 feet with 2 turns activity did not occur: Safety/medical concerns (fatigue)  Assist level: Supervision/Verbal cueing Assistive device: Walker-rolling    Walk 150 feet activity   Assist Walk 150 feet activity did not occur: Safety/medical concerns         Walk 10 feet on uneven surface  activity   Assist Walk 10 feet on uneven surfaces activity did not occur: Safety/medical concerns         Wheelchair     Assist Will patient use  wheelchair at discharge?: Yes Type of Wheelchair: Manual    Wheelchair assist level: Supervision/Verbal cueing Max wheelchair distance: >143ft    Wheelchair 50 feet with 2 turns activity    Assist        Assist Level: Supervision/Verbal cueing   Wheelchair 150 feet activity     Assist      Assist Level: Supervision/Verbal cueing    Blood pressure 134/87, pulse 93, temperature 98.7 F (37.1 C), temperature source Oral, resp. rate 18, height 5\' 6"  (1.676 m), weight 71.9 kg, SpO2 98 %.  Medical Problem List and Plan: 1.Decreased functional abilitysecondary to right BKA 06/08/2020 due to necrotizing fasciitis.   Continue CIR 2. Antithrombotics: -DVT/anticoagulation:Subcutaneous heparin -antiplatelet therapy: N/A 3. Pain Management:Oxycodone as needed  Controlled with meds 11/9 4. Mood:Provide emotional support -antipsychotic agents: N/A 5. Neuropsych: This patientiscapable of making decisions on hisown behalf. 6. Skin/Wound Care:              Continue stump shrinker, no need for Telfa 7. Fluids/Electrolytes/Nutrition:Routine in and outs. 8.  CKD:   Creatinine 1.35 on 11/4, labs ordered for tomorrow  Encourage hydration 9. Diabetes mellitus peripheral neuropathy. Hemoglobin A1c 14.5. Lantus insulin 30 units daily. Diabetic teaching  Lantus 10 nightly started on 11/1, increased to 20 on 11/3  Increase Lantus to 21 U 11/5, increased to 22 on 11/6  Slightly labile on 11/9, would not make any further changes at present to avoid hypoglycemia  Monitor with increased mobility 10. Hypertension/sinus tachycardia.   Lisinopril 10 mg daily, increased to 20 on 11/1  HCTZ 12.5 mg daily, Norvasc 10 mg daily.  ECG showing sinus tachycardia  Metoprolol 12.5 twice daily started on 11/3  Blood pressure/heart rate relatively controlled on 11/9   11. 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 Patient reportedly independent prior to admission. 12. Hyperlipidemia. Resume Lipitor 13. ID: wound cx negative.    Completed course of vanc and zosyn.     WBCs 11.9 on 11/1  Afebrile 14.  Hypokalemia  Potassium 3.3 on 11/4, labs ordered for tomorrow  Supplemented x3 again 15.?  Anemia of chronic disease  Hemoglobin 11.8 on 11/1, labs ordered for  tomorrow  Continue to monitor  16.  Constipation  Bowel meds started 11/9  LOS: 11 days A FACE TO FACE EVALUATION WAS PERFORMED  Ricky [name] 13/9 06/22/2020, 10:06 AM

## 2020-06-23 ENCOUNTER — Ambulatory Visit (HOSPITAL_COMMUNITY): Payer: Self-pay

## 2020-06-23 ENCOUNTER — Other Ambulatory Visit (HOSPITAL_COMMUNITY): Payer: Self-pay | Admitting: Physician Assistant

## 2020-06-23 ENCOUNTER — Inpatient Hospital Stay (HOSPITAL_COMMUNITY): Payer: Self-pay | Admitting: Occupational Therapy

## 2020-06-23 ENCOUNTER — Inpatient Hospital Stay (HOSPITAL_COMMUNITY): Payer: Self-pay | Admitting: Physical Therapy

## 2020-06-23 ENCOUNTER — Encounter (HOSPITAL_COMMUNITY): Payer: Self-pay | Admitting: Occupational Therapy

## 2020-06-23 DIAGNOSIS — K5903 Drug induced constipation: Secondary | ICD-10-CM

## 2020-06-23 LAB — GLUCOSE, CAPILLARY
Glucose-Capillary: 82 mg/dL (ref 70–99)
Glucose-Capillary: 153 mg/dL — ABNORMAL HIGH (ref 70–99)
Glucose-Capillary: 145 mg/dL — ABNORMAL HIGH (ref 70–99)
Glucose-Capillary: 149 mg/dL — ABNORMAL HIGH (ref 70–99)

## 2020-06-23 LAB — CBC WITH DIFFERENTIAL/PLATELET
Abs Immature Granulocytes: 0.02 10*3/uL (ref 0.00–0.07)
Basophils Absolute: 0.1 10*3/uL (ref 0.0–0.1)
Basophils Relative: 1 %
Eosinophils Absolute: 0 10*3/uL (ref 0.0–0.5)
Eosinophils Relative: 0 %
HCT: 35.8 % — ABNORMAL LOW (ref 39.0–52.0)
Hemoglobin: 11.2 g/dL — ABNORMAL LOW (ref 13.0–17.0)
Immature Granulocytes: 0 %
Lymphocytes Relative: 28 %
Lymphs Abs: 2.2 10*3/uL (ref 0.7–4.0)
MCH: 26.2 pg (ref 26.0–34.0)
MCHC: 31.3 g/dL (ref 30.0–36.0)
MCV: 83.8 fL (ref 80.0–100.0)
Monocytes Absolute: 0.6 10*3/uL (ref 0.1–1.0)
Monocytes Relative: 8 %
Neutro Abs: 4.9 10*3/uL (ref 1.7–7.7)
Neutrophils Relative %: 63 %
Platelets: 555 10*3/uL — ABNORMAL HIGH (ref 150–400)
RBC: 4.27 MIL/uL (ref 4.22–5.81)
RDW: 13.9 % (ref 11.5–15.5)
WBC: 7.7 10*3/uL (ref 4.0–10.5)
nRBC: 0 % (ref 0.0–0.2)

## 2020-06-23 LAB — BASIC METABOLIC PANEL
Anion gap: 11 (ref 5–15)
BUN: 17 mg/dL (ref 6–20)
CO2: 25 mmol/L (ref 22–32)
Calcium: 9.2 mg/dL (ref 8.9–10.3)
Chloride: 105 mmol/L (ref 98–111)
Creatinine, Ser: 1.41 mg/dL — ABNORMAL HIGH (ref 0.61–1.24)
GFR, Estimated: 59 mL/min — ABNORMAL LOW (ref >60)
Glucose, Bld: 90 mg/dL (ref 70–99)
Potassium: 4.1 mmol/L (ref 3.5–5.1)
Sodium: 141 mmol/L (ref 135–145)

## 2020-06-23 MED ORDER — LISINOPRIL 20 MG PO TABS: 20.0000 mg | ORAL_TABLET | Freq: Every day | ORAL | 0 refills | Status: DC

## 2020-06-23 MED ORDER — POLYETHYLENE GLYCOL 3350 17 G PO PACK: 17.0000 g | PACK | Freq: Every day | ORAL | 0 refills | Status: DC

## 2020-06-23 MED ORDER — HYDROCHLOROTHIAZIDE 12.5 MG PO CAPS: 12.5000 mg | ORAL_CAPSULE | Freq: Every day | ORAL | 0 refills | Status: DC

## 2020-06-23 MED ORDER — INSULIN GLARGINE 100 UNIT/ML SOLOSTAR PEN: 30.0000 [IU] | PEN_INJECTOR | Freq: Every day | SUBCUTANEOUS | 11 refills | Status: DC

## 2020-06-23 MED ORDER — INSULIN GLARGINE 100 UNIT/ML SOLOSTAR PEN: 22.0000 [IU] | PEN_INJECTOR | Freq: Every day | SUBCUTANEOUS | 11 refills | Status: DC

## 2020-06-23 MED ORDER — ATORVASTATIN CALCIUM 20 MG PO TABS: 20.0000 mg | ORAL_TABLET | Freq: Every day | ORAL | 0 refills | Status: DC

## 2020-06-23 MED ORDER — INSULIN GLARGINE 100 UNITS/ML SOLOSTAR PEN: 30.0000 [IU] | PEN_INJECTOR | Freq: Every day | SUBCUTANEOUS | 11 refills | Status: DC

## 2020-06-23 MED ORDER — ACETAMINOPHEN 325 MG PO TABS: 650.0000 mg | ORAL_TABLET | Freq: Four times a day (QID) | ORAL | Status: DC | PRN

## 2020-06-23 MED ORDER — METOPROLOL TARTRATE 25 MG PO TABS: 12.5000 mg | ORAL_TABLET | Freq: Two times a day (BID) | ORAL | 0 refills | Status: DC

## 2020-06-23 MED ORDER — AMLODIPINE BESYLATE 10 MG PO TABS: 10.0000 mg | ORAL_TABLET | Freq: Every day | ORAL | 0 refills | Status: DC

## 2020-06-23 MED ORDER — OXYCODONE HCL 5 MG PO TABS: 5.0000 mg | ORAL_TABLET | ORAL | 0 refills | Status: DC | PRN

## 2020-06-23 MED FILL — PENTIPS 32G X 4 MM MISC: 32G X 4 MM | 30 days supply | Qty: 100 | Fill #0

## 2020-06-23 MED FILL — LISINOPRIL 20 MG TABLET: 20 | 30 days supply | Qty: 30 | Fill #0

## 2020-06-23 MED FILL — oxyCODONE HCL 5 MG TABS: 5 | 3 days supply | Qty: 30 | Fill #0

## 2020-06-23 MED FILL — ATORVASTATIN CALCIUM 20 MG: 20 | 30 days supply | Qty: 30 | Fill #0

## 2020-06-23 MED FILL — LANTUS SOLOSTAR 100 UNITS/M: 100 | 27 days supply | Qty: 15 | Fill #0

## 2020-06-23 MED FILL — HYDROCHLOROTHIAZIDE 12.5 MG: 12.5 | 30 days supply | Qty: 30 | Fill #0

## 2020-06-23 MED FILL — AMLODIPINE BESYLATE 10 MG T: 10 | 30 days supply | Qty: 30 | Fill #0

## 2020-06-23 MED FILL — METOPROLOL TARTRATE 25 MG T: 25 | 30 days supply | Qty: 30 | Fill #0

## 2020-06-23 NOTE — Progress Notes (Signed)
Physical Therapy Discharge Summary  Patient Details  Name: Ricky [name] MRN: [id number] Date of Birth: Apr 28, 1966  Today's Date: 06/23/2020 PT Individual Time: 9211-9417 PT Individual Time Calculation (min): 64 min   Patient has met 10 of 10 long term goals due to improved activity tolerance, improved balance, improved postural control, increased strength, improved awareness and improved coordination. Patient to discharge at a wheelchair level Supervision/Mod I. Patient's daughter is independent to provide the necessary physical assistance at discharge. Pt's daughter attended family education training on 11/10 and verbalized and demonstrated confidence with all transfers/tasks (particurarly stair navigation) to ensure safe discharge home. Pt stated her grandmother is able to assist with stair navigation moving the shower chair up/down stairs. Pt/pt's daughter with no further questions regarding mobility.   All goals met  Recommendation:  Patient will benefit from ongoing skilled PT services in home health setting to continue to advance safe functional mobility, address ongoing impairments in transfers, generalized strengthening, amputee education, dynamic standing balance/coordination, ambulation, stair navigation, endurance, and to minimize fall risk.  Equipment: 16x16 manual WC with R amputee support pad, RW, shower chair for stair navigation  Reasons for discharge: treatment goals met  Patient/family agrees with progress made and goals achieved: Yes  Today's Interventions Received pt in ortho gym, handoff from OT, with pt sitting in [facility] and daughter present for family education training. Pt agreeable to therapy and denied any pain during session. Session with emphasis on family education, discharge planning, functional mobility/transfers, generalized strengthening, dynamic standing balance/coordination, ambulation, simulated car transfers, stair navigation, limb loss education, and  improved activity tolerance. Pt performed ambulatory simulated car transfer with RW and supervision provided by daughter x 2 trials with cues for turning technique and safety. Pt ambulated 102f on uneven surfaces ramp with RW and CGA/close supervision provided by pt's daughter with cues for RW safety. Therapist educated pt's daughter on importance of standing on pt's R side in case of LOB; pt's daughter verbalized and demonstrated understanding. Pt's daughter reported there is actually no rails on the steps at home. [facility] and therapist demonstrated stair navigation using shower chair using pt's daughter's shoulders as the "rail" and having pt's mother provide CGA and move the shower chair up/down the steps at pt's daughter reported her grandmother is in good physical health. Pt navigated 3 steps using daughter as rail with therapist providing CGA and managing the shower chair. Pt and pt's daughter verbalized and demonstrated confidence with stair navigation technique and pt's daughter reported plan to have a ramp installed within the next few weeks. Pt ambulated additional 249fx 2 trials with RW and supervision provided by pt's daughter and performed bilateral UE and LLE strengthening on Nustep at workload 6 for 7 minutes for 293 steps for improved cardiovascular endurance. [facility] and [facility] WC<>bed squat<>pivot with supervision and sit<>supine independently. Concluded session with pt supine in bed, needs within reach, bed alarm on, and NT and pt's daughter present at bedside.   PT Discharge Precautions/Restrictions Precautions Precautions: Fall Precaution Comments: R BKA Restrictions Weight Bearing Restrictions: Yes RLE Weight Bearing: Non weight bearing Cognition Overall Cognitive Status: Within Functional Limits for tasks assessed Arousal/Alertness: Awake/alert Orientation Level: Oriented X4 Memory: Impaired Awareness:  Appears intact Problem Solving: Impaired Safety/Judgment: Appears intact Sensation Sensation Light Touch: Appears Intact Proprioception: Impaired by gross assessment Coordination Gross Motor Movements are Fluid and Coordinated: No Fine Motor Movements are Fluid and Coordinated: Yes Coordination  and Movement Description: grossly uncoordinated due to R BKA, generalized weakness, and decreased balance/postural control Finger Nose Finger Test: [facility] Heel Shin Test: WFL on LLE unable to perform on RLE due to BKA but hip flexibility WFL Motor  Motor Motor: Abnormal postural alignment and control Motor - Skilled Clinical Observations: grossly uncoordinated due to R BKA, decreased balance/postural control, and poor endurance with activity  Mobility Bed Mobility Bed Mobility: Rolling Right;Rolling Left;Supine to Sit;Sit to Supine Rolling Right: Independent Rolling Left: Independent Supine to Sit: Independent Sit to Supine: Independent Transfers Transfers: Sit to Stand;Stand to Sit;Stand Pivot Transfers;Squat Pivot Transfers Sit to Stand: Independent with assistive device Stand to Sit: Independent with assistive device Stand Pivot Transfers: Supervision/Verbal cueing Stand Pivot Transfer Details: Verbal cues for precautions/safety;Verbal cues for safe use of DME/AE Stand Pivot Transfer Details (indicate cue type and reason): occasional cues for RW safety and transfer technique Squat Pivot Transfers: Independent with assistive device Transfer (Assistive device): Rolling walker Locomotion  Gait Ambulation: Yes Gait Assistance: Supervision/Verbal cueing Gait Distance (Feet): 85 Feet Assistive device: Rolling walker Gait Assistance Details: Verbal cues for precautions/safety Gait Assistance Details: verbal cues for energy conservation strategies Gait Gait: Yes Gait Pattern: Impaired Gait Pattern: Poor foot clearance - left;Decreased step length - left;Decreased stride length (hop-to gait  pattern on LLE due to R BKA) Gait velocity: decreased Stairs / Additional Locomotion Stairs: Yes Stairs Assistance: Contact Guard/Touching assist Stair Management Technique: One rail Left (total A to manage shower chair with stair navigation) Number of Stairs: 4 Height of Stairs: 6 Ramp: Contact Guard/touching assist (RW) Product manager Mobility: Yes Wheelchair Assistance: Independent with Camera operator: Both upper extremities Wheelchair Parts Management: Supervision/cueing Distance: >134f  Trunk/Postural Assessment  Cervical Assessment Cervical Assessment: Within Functional Limits Thoracic Assessment Thoracic Assessment: Within Functional Limits Lumbar Assessment Lumbar Assessment: Exceptions to [facility](posterior pelvic tilt) Postural Control Postural Control: Deficits on evaluation  Balance Balance Balance Assessed: Yes Static Sitting Balance Static Sitting - Balance Support: No upper extremity supported;Feet supported Static Sitting - Level of Assistance: 7: Independent Dynamic Sitting Balance Dynamic Sitting - Balance Support: Feet supported;No upper extremity supported Dynamic Sitting - Level of Assistance: 7: Independent Static Standing Balance Static Standing - Balance Support: Bilateral upper extremity supported (RW) Static Standing - Level of Assistance: 6: Modified independent (Device/Increase time) Dynamic Standing Balance Dynamic Standing - Balance Support: Bilateral upper extremity supported (RW) Dynamic Standing - Level of Assistance: 5: Stand by assistance (supervision) Extremity Assessment  RLE Assessment RLE Assessment: Exceptions to [facility] Strength Comments: grossly generalized to 4+/5 (hip flexion, knee flexion/extension, hip abd/add) LLE Assessment LLE Assessment: Exceptions to [facility] Strength Comments: grossly generalized to 4+/5  AAlfonse AlpersPT, DPT  06/23/2020, 7:44 AM

## 2020-06-23 NOTE — Progress Notes (Signed)
Pt has elastic shrinker on

## 2020-06-23 NOTE — Plan of Care (Signed)

## 2020-06-23 NOTE — Progress Notes (Signed)
Orthopedic Tech Progress Note Patient Details:  Ricky [name] 1965-08-20 [id number] Called in order to [employer] for BKA SHRINKERS Patient ID: Ricky [name], male   DOB: [birth year], 55 y.o.   MRN: [id number]   Ricky [name] 06/23/2020, 12:09 PM

## 2020-06-23 NOTE — Progress Notes (Signed)
Physical Therapy Session Note  Patient Details  Name: Ricky [name] MRN: [id number] Date of Birth: January 28, 1966  Today's Date: 06/23/2020 PT Individual Time: 1120-1200 PT Individual Time Calculation (min): 40 min   Short Term Goals: Week 2:  PT Short Term Goal 1 (Week 2): STG=LTG due to LOS  Skilled Therapeutic Interventions/Progress Updates: Pt presented in bed agreeable to therapy. Pt states pain 5/10 with no intervention requested. Pt performed bed mobility mod I and performed squat pivot transfer to w/c mod I. Pt required minA for leg rest management due to difficulty locking leg rests. Pt then propelled to ortho gym and participated in car transfer initially requiring verbal cues for safety but when performed again was able to perform with supervision via squat pivot. Pt then propelled to ADL apt and performed pivot to standard bed and bed mobility mod I overall. Pt then propelled to rehab gym and PTA discussed safety at home overall. PTA also stressed importance of maintaining mobility and HEP until ready for orthotic with pt verbalizing understanding. Pt then performed STS supervision and picked up bean bags off floor with reacher with supervision. Pt then ambulated 50f x 2 with RW and close S. Pt with some decreased L foot clearance with mild fatigue but no catching note LOB. Pt returned to w/c and propelled back to room. Pt requesting to return to bed and performed squat pivot transfer to bed mod I. Pt returned to bed and positioned delt to comfort. Pt left in bed with bed alarm on, call bell within reach and needs met.       Therapy Documentation Precautions:  Precautions Precautions: Fall Precaution Comments: R BKA Restrictions Weight Bearing Restrictions: Yes RLE Weight Bearing: Non weight bearing General:   Vital Signs: Therapy Vitals Temp: 98.4 F (36.9 C) Pulse Rate: 98 Resp: 20 BP: 112/74 Patient Position (if appropriate): Lying Oxygen Therapy SpO2: 98 % Pain:    Mobility: Bed Mobility Bed Mobility: Rolling Right;Rolling Left;Supine to Sit;Sit to Supine Rolling Right: Independent Rolling Left: Independent Supine to Sit: Independent Sit to Supine: Independent Transfers Transfers: Sit to Stand;Stand to Sit;Stand Pivot Transfers;Squat Pivot Transfers Sit to Stand: Independent with assistive device Stand to Sit: Independent with assistive device Stand Pivot Transfers: Supervision/Verbal cueing Stand Pivot Transfer Details: Verbal cues for precautions/safety;Verbal cues for safe use of DME/AE Squat Pivot Transfers: Independent with assistive device Transfer (Assistive device): Rolling walker Locomotion :    Trunk/Postural Assessment : Cervical Assessment Cervical Assessment: Within Functional Limits Thoracic Assessment Thoracic Assessment: Within Functional Limits Lumbar Assessment Lumbar Assessment: Exceptions to [facility](posterior pelvic tilt) Postural Control Postural Control: Deficits on evaluation  Balance: Balance Balance Assessed: Yes Static Sitting Balance Static Sitting - Balance Support: No upper extremity supported;Feet supported Static Sitting - Level of Assistance: 7: Independent Dynamic Sitting Balance Dynamic Sitting - Balance Support: Feet supported;No upper extremity supported Dynamic Sitting - Level of Assistance: 7: Independent Static Standing Balance Static Standing - Balance Support: Bilateral upper extremity supported (RW) Static Standing - Level of Assistance: 6: Modified independent (Device/Increase time) Dynamic Standing Balance Dynamic Standing - Balance Support: Bilateral upper extremity supported (RW) Dynamic Standing - Level of Assistance: 5: Stand by assistance (supervision) Exercises:   Other Treatments:      Therapy/Group: Individual Therapy  Matty Deamer 06/23/2020, 4:03 PM

## 2020-06-23 NOTE — Progress Notes (Signed)
Occupational Therapy Discharge Summary  Patient Details  Name: Ricky [name] MRN: [id number] Date of Birth: [birth year]   Patient has met 9 of 9 long term goals due to improved activity tolerance, improved balance, postural control and ability to compensate for deficits.  Patient to discharge at overall Supervision level.  Patient's care partner is independent to provide the necessary supervision assistance at discharge.  Patient's daughter present for family education, observing pt mobility and asking appropriate questions regarding f/u and care for residual limb.  Reasons goals not met: N/A  Recommendation:  Patient will benefit from ongoing skilled OT services in home health setting to continue to advance functional skills in the area of BADL and Reduce care partner burden.  Equipment: drop arm BSC  Reasons for discharge: treatment goals met and discharge from hospital  Patient/family agrees with progress made and goals achieved: Yes  OT Discharge Precautions/Restrictions  Precautions Precautions: Fall Precaution Comments: R BKA Restrictions Weight Bearing Restrictions: Yes RLE Weight Bearing: Non weight bearing General   Vital Signs Therapy Vitals Temp: 98.4 F (36.9 C) Pulse Rate: 98 Resp: 20 BP: 112/74 Patient Position (if appropriate): Lying Oxygen Therapy SpO2: 98 % Pain   ADL ADL Grooming: Modified independent Where Assessed-Grooming: Sitting at sink Upper Body Bathing: Setup Where Assessed-Upper Body Bathing: Sitting at sink Lower Body Bathing: Supervision/safety Where Assessed-Lower Body Bathing: Standing at sink, Sitting at sink Upper Body Dressing: Modified independent (Device) Where Assessed-Upper Body Dressing: Sitting at sink Lower Body Dressing: Supervision/safety Where Assessed-Lower Body Dressing: Sitting at sink, Standing at sink Toileting: Supervision/safety Where Assessed-Toileting: Glass blower/designer: Close supervision Toilet  Transfer Method: Counselling psychologist: Radiographer, therapeutic: Close supervison Clinical cytogeneticist Method: Sit pivot, Optometrist: Gaffer Baseline Vision/History: Wears glasses Wears Glasses: Distance only Patient Visual Report: No change from baseline Vision Assessment?: No apparent visual deficits Perception  Perception: Within Functional Limits Praxis Praxis: Intact Cognition Overall Cognitive Status: Within Functional Limits for tasks assessed Arousal/Alertness: Awake/alert Memory: Impaired Awareness: Appears intact Problem Solving: Impaired Safety/Judgment: Appears intact Sensation Sensation Light Touch: Appears Intact Proprioception: Impaired by gross assessment Coordination Gross Motor Movements are Fluid and Coordinated: No Fine Motor Movements are Fluid and Coordinated: Yes Coordination and Movement Description: grossly uncoordinated due to R BKA, generalized weakness, and decreased balance/postural control Finger Nose Finger Test: [facility] Heel Shin Test: WFL on LLE unable to perform on RLE due to BKA but hip flexibility WFL Motor  Motor Motor: Abnormal postural alignment and control Motor - Skilled Clinical Observations: grossly uncoordinated due to R BKA, decreased balance/postural control, and poor endurance with activity Mobility  Bed Mobility Bed Mobility: Rolling Right;Rolling Left;Supine to Sit;Sit to Supine Rolling Right: Independent Rolling Left: Independent Supine to Sit: Independent Sit to Supine: Independent Transfers Sit to Stand: Independent with assistive device Stand to Sit: Independent with assistive device  Trunk/Postural Assessment  Cervical Assessment Cervical Assessment: Within Functional Limits Thoracic Assessment Thoracic Assessment: Within Functional Limits Lumbar Assessment Lumbar Assessment: Exceptions to [facility] (posterior pelvic tilt) Postural Control Postural  Control: Deficits on evaluation  Balance Balance Balance Assessed: Yes Static Sitting Balance Static Sitting - Balance Support: No upper extremity supported;Feet supported Static Sitting - Level of Assistance: 7: Independent Dynamic Sitting Balance Dynamic Sitting - Balance Support: Feet supported;No upper extremity supported Dynamic Sitting - Level of Assistance: 7: Independent Static Standing Balance Static Standing - Balance Support: Bilateral upper extremity supported (RW) Static Standing - Level of Assistance: 6: Modified independent (Device/Increase time) Dynamic  Standing Balance Dynamic Standing - Balance Support: Bilateral upper extremity supported (RW) Dynamic Standing - Level of Assistance: 5: Stand by assistance (supervision) Extremity/Trunk Assessment RUE Assessment RUE Assessment: Within Functional Limits LUE Assessment LUE Assessment: Within Functional Limits   Karuna Balducci, [facility] 06/23/2020, 4:04 PM

## 2020-06-23 NOTE — Progress Notes (Signed)
Writer spoke to [employer]. They have an order for shrinker, will be delivered late night today or morning tomorrow.

## 2020-06-23 NOTE — Progress Notes (Signed)
Occupational Therapy Session Note  Patient Details  Name: Ricky [name] MRN: [id number] Date of Birth: 18-Jul-1966  Today's Date: 06/23/2020 OT Individual Time: 3383-2919 OT Individual Time Calculation (min): 43 min    Short Term Goals: Week 1:  OT Short Term Goal 1 (Week 1): STGs=LTGs dt ELOS  Skilled Therapeutic Interventions/Progress Updates:    Treatment session with focus on education with pt's daughter. Pt received seated EOB with daughter present for family education.  Discussed purpose of OT and current goals and progress towards goals.  Educated on care for residual limb to include limb inspection and doffing/donning shrinker.  Pt completed short distance ambulatory transfers with RW with supervision.  Therapist educated on supervision with mobility and bathing at sink vs shower.  Pt propelled to ADL tubroom at Mod I level.  Pt completed tub/shower transfers with tub bench with supervision, daughter observed technique.  Reiterated importance of use of tub bench for transfer due to single limb.  Pt completed ambulatory transfer to toilet with BSC over toilet.  Educated on use of 3 in 1 next to bed vs over toilet as well as ambulatory transfers vs stand pivot from w/c depending on setup and level of fatigue. Pt demonstrating safety with all mobility this session.  Daughter reports understanding of recommendations, but did not take any active part in family education.  Therapy Documentation Precautions:  Precautions Precautions: Fall Precaution Comments: R BKA Restrictions Weight Bearing Restrictions: Yes RLE Weight Bearing: Non weight bearing General:   Vital Signs: Therapy Vitals Temp: 98.4 F (36.9 C) Pulse Rate: 98 Resp: 20 BP: 112/74 Patient Position (if appropriate): Lying Oxygen Therapy SpO2: 98 % Pain:  Pt with no c/o pain   Therapy/Group: Individual Therapy  Rosalio Loud 06/23/2020, 3:28 PM

## 2020-06-23 NOTE — Progress Notes (Signed)
[facility] PHYSICAL MEDICINE & REHABILITATION PROGRESS NOTE   Subjective/Complaints: Patient seen sitting up in his chair this morning.  He states he slept well overnight.  He states he is open for discharge tomorrow.  He is able to don and doff his brace without difficulty.  ROS: Denies CP, SOB, N/V/D  Objective:   No results found. Recent Labs    06/23/20 0413  WBC 7.7  HGB 11.2*  HCT 35.8*  PLT 555*   Recent Labs    06/23/20 0413  NA 141  K 4.1  CL 105  CO2 25  GLUCOSE 90  BUN 17  CREATININE 1.41*  CALCIUM 9.2    Intake/Output Summary (Last 24 hours) at 06/23/2020 1125 Last data filed at 06/23/2020 0753 Gross per 24 hour  Intake 640 ml  Output 500 ml  Net 140 ml    Physical Exam: Vital Signs Blood pressure 119/70, pulse 92, temperature 98.8 F (37.1 C), temperature source Oral, resp. rate 18, height 5\' 6"  (1.676 m), weight 71.9 kg, SpO2 98 %.  Constitutional: No distress . Vital signs reviewed. HENT: Normocephalic.  Atraumatic. Eyes: EOMI. No discharge. Cardiovascular: No JVD.  RRR. Respiratory: Normal effort.  No stridor.  Bilateral clear to auscultation. GI: Non-distended.  BS +. Skin: Warm and dry.  Right BKA with dressing CDI. Psych: Normal mood.  Normal behavior. Musc: Right BKA with edema and tenderness, improving. Neuro: Alert Motor:  Left lower extremity: 5/5 proximal distal Right lower extremity: Hip flexion: 4/5 (some pain inhibition), unchanged  Assessment/Plan: 1. Functional deficits secondary to right BKA which require 3+ hours per day of interdisciplinary therapy in a comprehensive inpatient rehab setting.  Physiatrist is providing close team supervision and 24 hour management of active medical problems listed below.  Physiatrist and rehab team continue to assess barriers to discharge/monitor patient progress toward functional and medical goals  Care Tool:  Bathing    Body parts bathed by patient: Right arm, Left arm, Chest,  Abdomen, Front perineal area, Right upper leg, Left upper leg, Face, Buttocks, Left lower leg   Body parts bathed by helper: Buttocks, Right lower leg Body parts n/a: Right lower leg   Bathing assist Assist Level: Supervision/Verbal cueing     Upper Body Dressing/Undressing Upper body dressing   What is the patient wearing?: Pull over shirt    Upper body assist Assist Level: Independent with assistive device    Lower Body Dressing/Undressing Lower body dressing      What is the patient wearing?: Pants     Lower body assist Assist for lower body dressing: Supervision/Verbal cueing     Toileting Toileting    Toileting assist Assist for toileting: Supervision/Verbal cueing     Transfers Chair/bed transfer  Transfers assist     Chair/bed transfer assist level: Supervision/Verbal cueing     Locomotion Ambulation   Ambulation assist      Assist level: Supervision/Verbal cueing Assistive device: Walker-rolling Max distance: 36ft   Walk 10 feet activity   Assist     Assist level: Supervision/Verbal cueing Assistive device: Walker-rolling   Walk 50 feet activity   Assist Walk 50 feet with 2 turns activity did not occur: Safety/medical concerns (fatigue)  Assist level: Supervision/Verbal cueing Assistive device: Walker-rolling    Walk 150 feet activity   Assist Walk 150 feet activity did not occur: Safety/medical concerns         Walk 10 feet on uneven surface  activity   Assist Walk 10 feet on uneven surfaces  activity did not occur: Safety/medical concerns         Wheelchair     Assist Will patient use wheelchair at discharge?: Yes Type of Wheelchair: Manual    Wheelchair assist level: Supervision/Verbal cueing Max wheelchair distance: >120ft    Wheelchair 50 feet with 2 turns activity    Assist        Assist Level: Supervision/Verbal cueing   Wheelchair 150 feet activity     Assist      Assist Level:  Supervision/Verbal cueing   Blood pressure 119/70, pulse 92, temperature 98.8 F (37.1 C), temperature source Oral, resp. rate 18, height 5\' 6"  (1.676 m), weight 71.9 kg, SpO2 98 %.  Medical Problem List and Plan: 1.Decreased functional abilitysecondary to right BKA 06/08/2020 due to necrotizing fasciitis.   Continue CIR   Patient and family education.  Team conference today to discuss current and goals and coordination of care, home and environmental barriers, and discharge planning with nursing, case manager, and therapies. Please see conference note from today as well.  2. Antithrombotics: -DVT/anticoagulation:Subcutaneous heparin -antiplatelet therapy: N/A 3. Pain Management:Oxycodone as needed  Controlled with meds 11/10 4. Mood:Provide emotional support -antipsychotic agents: N/A 5. Neuropsych: This patientiscapable of making decisions on hisown behalf. 6. Skin/Wound Care:              Continue stump shrinker, no need for Telfa 7. Fluids/Electrolytes/Nutrition:Routine in and outs. 8.  CKD:   Creatinine 1.41 on 11/10  Encourage hydration 9. Diabetes mellitus peripheral neuropathy. Hemoglobin A1c 14.5. Lantus insulin 30 units daily. Diabetic teaching  Lantus 10 nightly started on 11/1, increased to 20 on 11/3  Increase Lantus to 21 U 11/5, increased to 22 on 11/6  Was relatively stable, however labile on 11/10, would not make any changes at this time to avoid hypoglycemia.  Monitor with increased mobility 10. Hypertension/sinus tachycardia.   Lisinopril 10 mg daily, increased to 20 on 11/1  HCTZ 12.5 mg daily, Norvasc 10 mg daily.  ECG showing sinus tachycardia  Metoprolol 12.5 twice daily started on 11/3  Blood pressure controlled on 11/10  Heart rate borderline controlled on 11/10    11. 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 Patient reportedly independent prior to admission. 12. Hyperlipidemia.  Resume Lipitor 13. ID: wound cx negative.    Completed course of vanc and zosyn.     WBCs 7.7 on 11/10  Afebrile 14.  Hypokalemia  Potassium 4.1 on 11/10  Supplemented x3 again 15.?  Anemia of chronic disease  Hemoglobin 11.2 on 11/10  Continue to monitor  16.  Constipation  Bowel meds started 11/9   Improving  LOS: 12 days A FACE TO FACE EVALUATION WAS PERFORMED  Ricca Melgarejo 13/9 06/23/2020, 11:25 AM

## 2020-06-23 NOTE — Progress Notes (Signed)
Occupational Therapy Session Note  Patient Details  Name: [name] MRN: [id number] Date of Birth: 05-14-1966  Today's Date: 06/23/2020 OT Individual Time: 9574-7340 OT Individual Time Calculation (min): 24 min    Short Term Goals: Week 1:  OT Short Term Goal 1 (Week 1): STGs=LTGs dt ELOS   Skilled Therapeutic Interventions/Progress Updates:    Pt greeted at time of session on toilet with NT, OT to resume care. Pt S/U of wet wash cloths for pericare after BM and supervision for clothing management in standing to don back over hips. Stand pivot back to wheelchair Supervision and set up in room to perform UB dress Mod I from w/c level and LB dress Supervision pt able to forward weight shift to thread feet and don over hips in standing with unilateral support. Pt up in chair with alarm on, talking on phone with family member, call bell in reach.   Therapy Documentation Precautions:  Precautions Precautions: Fall Precaution Comments: R BKA Restrictions Weight Bearing Restrictions: Yes RLE Weight Bearing: Non weight bearing    Therapy/Group: Individual Therapy  Erasmo Score 06/23/2020, 8:57 AM

## 2020-06-23 NOTE — Progress Notes (Signed)
Ortho tech was paged re: shrinker.Awaitng call back.

## 2020-06-23 NOTE — Patient Care Conference (Signed)
Inpatient RehabilitationTeam Conference and Plan of Care Update Date: 06/23/2020   Time: 11:51 AM     Patient Name: Ricky [name]      Medical Record Number: [id number]  Date of Birth: 31-Oct-1965 Sex: Male         Room/Bed: 4W21C/4W21C-01 Payor Info: Payor: /    Admit Date/Time:  06/11/2020  3:13 PM  Primary Diagnosis:  Right below-knee amputee [facility])  Hospital Problems: Principal Problem:   Right below-knee amputee [facility]) Active Problems:   Anemia of chronic disease   Hypokalemia   Essential hypertension   Diabetic peripheral neuropathy (HCC)   AKI (acute kidney injury) (HCC)   Postoperative pain   Sinus tachycardia   Drug induced constipation    Expected Discharge Date: Expected Discharge Date: 06/24/20  Team Members Present: Physician leading conference: Dr. Maryla Morrow Care Coodinator Present: Dossie Der, LCSW;Puja Caffey Lambert Mody, RN, BSN, CRRN Nurse Present: Mauro Kaufmann, LPN PT Present: Raechel Chute, PT OT Present: Rosalio Loud, OT PPS Coordinator present : Edson Snowball, PT     Current Status/Progress Goal Weekly Team Focus  Bowel/Bladder             Swallow/Nutrition/ Hydration             ADL's   supervision squat pivot and ambulatory transfers with RW, bathing and LB dressing supervision at sit > stand level  Supervision overall- bathing, transfers, toileting, LB dressing. Mod I UB dressing  ADL retraining, transfers, amputee education, dynamic standing balance, endurance, generalized strengthening, d/c planning   Mobility   bed mobility supervision, transfers with RW CGA, gait 2ft with RW CGA, WC mobility 158ft supervision, 4 steps with 1 rail CGA using shower chair  supervision  functional mobility/transfers, amputee education, generalized strengthening, dynamic standing balance/coordination, stair navigation, and endurance   Communication             Safety/Cognition/ Behavioral Observations            Pain             Skin                Discharge Planning:  Daughter to be here today for family education-pt lives with elderly Mom who is there but can not provide much assist.  See how education goes today   Team Discussion: Chronic issues; WBC, and potassium level WNL; BP stable, CBGs controlled. Continent of bowel and bladder. Requires cues for set up/prep for bathing at the sink. Able to ambulate 46' with supervision and manage 4 step entry with one rail with CGA. Patient on target to meet rehab goals: yes, currently supervision/CGA with supervision-mod I goals set.  *See Care Plan and progress notes for long and short-term goals.   Revisions to Treatment Plan:   Teaching Needs: Transfers, toileting, medications, etc.   Current Barriers to Discharge: Decreased caregiver support, Home enviroment access/layout and lack of insurance for [facility] services  Possible Resolutions to Barriers: Home exercise program Family education scheduled 06/23/20     Medical Summary Current Status: Decreased functional ability secondary to right BKA 06/08/2020 due to necrotizing fasciitis  Barriers to Discharge: Medical stability;Wound care;Weight bearing restrictions;Other (comments)  Barriers to Discharge Comments: No edemaConcerns Possible Resolutions to [employer] Focus: Therapies, patient family education   Continued Need for Acute Rehabilitation Level of Care: The patient requires daily medical management by a physician with specialized training in physical medicine and rehabilitation for the following reasons: Direction of a multidisciplinary physical rehabilitation program to maximize functional independence :  Yes Medical management of patient stability for increased activity during participation in an intensive rehabilitation regime.: Yes Analysis of laboratory values and/or radiology reports with any subsequent need for medication adjustment and/or medical intervention. : Yes   I attest that I was present, lead the team  conference, and concur with the assessment and plan of the team.   Chana Bode B 06/23/2020, 3:58 PM

## 2020-06-23 NOTE — Progress Notes (Signed)
Patient ID: Ricky [name], male   DOB: 1965-09-11, 54 y.o.   MRN: [id number] Met with pt and daughter who is here for education in preparation for discharge tomorrow. She did well and have asked her to call [employer] to give them a credit card so pt can get his equipment prior to discharge tomorrow. She will and informed of medications assist and follow up with [facility] in [place]. Will see daughter tomorrow for any last minute questions.

## 2020-06-23 NOTE — Discharge Instructions (Signed)
Inpatient Rehab Discharge Instructions  Ricky [name] Discharge date and time: No discharge date for patient encounter.   Activities/Precautions/ Functional Status: Activity: activity as tolerated Diet: diabetic diet Wound Care: Routine skin checks Functional status:  ___ No restrictions     ___ Walk up steps independently ___ 24/7 supervision/assistance   ___ Walk up steps with assistance ___ Intermittent supervision/assistance  ___ Bathe/dress independently ___ Walk with walker     _x__ Bathe/dress with assistance ___ Walk Independently    ___ Shower independently ___ Walk with assistance    ___ Shower with assistance ___ No alcohol     ___ Return to work/school ________  Special Instructions: No driving smoking or alcohol  Apply nonadherent dressing to incision and cover with shrinker  COMMUNITY REFERRALS UPON DISCHARGE:   HOME EXERCISE PROGRAM DUE TO CHARITY HOME CARE WILL NOT GO TO [facility]FF[facility]  Medical Equipment/Items Ordered:WHEELCHAIR, DROP-ARM BEDSIDE COMMODE, ROLLING WALKER AND TUB SEAT                                                 Agency/Supplier:[employer]  [phone]  MATCH GIVEN FOR ASSISTANCE WITH MEDICATIONS AND TO FOLLOW UP WITH [facility]  My questions have been answered and I understand these instructions. I will adhere to these goals and the provided educational materials after my discharge from the hospital.  Patient/Caregiver Signature _______________________________ Date __________  Clinician Signature _______________________________________ Date __________  Please bring this form and your medication list with you to all your follow-up doctor's appointments.

## 2020-06-24 DIAGNOSIS — G8918 Other acute postprocedural pain: Secondary | ICD-10-CM

## 2020-06-24 LAB — GLUCOSE, CAPILLARY: Glucose-Capillary: 97 mg/dL (ref 70–99)

## 2020-06-24 NOTE — Progress Notes (Signed)
[place] PHYSICAL MEDICINE & REHABILITATION PROGRESS NOTE   Subjective/Complaints: Patient seen sitting up in bed this morning.  He states he slept well overnight.  He states he is ready for discharge.  ROS: Denies CP, SOB, N/V/D  Objective:   No results found. Recent Labs    06/23/20 0413  WBC 7.7  HGB 11.2*  HCT 35.8*  PLT 555*   Recent Labs    06/23/20 0413  NA 141  K 4.1  CL 105  CO2 25  GLUCOSE 90  BUN 17  CREATININE 1.41*  CALCIUM 9.2    Intake/Output Summary (Last 24 hours) at 06/24/2020 1219 Last data filed at 06/24/2020 0749 Gross per 24 hour  Intake 540 ml  Output --  Net 540 ml    Physical Exam: Vital Signs Blood pressure 136/86, pulse 91, temperature 99.8 F (37.7 C), temperature source Oral, resp. rate 18, height 5\' 6"  (1.676 m), weight 71.9 kg, SpO2 100 %.  Constitutional: No distress . Vital signs reviewed. HENT: Normocephalic.  Atraumatic. Eyes: EOMI. No discharge. Cardiovascular: No JVD.  RRR. Respiratory: Normal effort.  No stridor.  Bilateral clear to auscultation. GI: Non-distended.  BS +. Skin: Warm and dry.  Right BKA with dressing CDI. Psych: Normal mood.  Normal behavior. Musc: Right BKA with edema and tenderness, improving  Neuro: Alert Motor:  Left lower extremity: 5/5 proximal distal Right lower extremity: Hip flexion: 4/5 (some pain inhibition), improving  Assessment/Plan: 1. Functional deficits secondary to right BKA which require 3+ hours per day of interdisciplinary therapy in a comprehensive inpatient rehab setting.  Physiatrist is providing close team supervision and 24 hour management of active medical problems listed below.  Physiatrist and rehab team continue to assess barriers to discharge/monitor patient progress toward functional and medical goals  Care Tool:  Bathing    Body parts bathed by patient: Right arm, Left arm, Chest, Abdomen, Front perineal area, Right upper leg, Left upper leg, Face, Buttocks,  Left lower leg   Body parts bathed by helper: Buttocks, Right lower leg Body parts n/a: Right lower leg   Bathing assist Assist Level: Supervision/Verbal cueing     Upper Body Dressing/Undressing Upper body dressing   What is the patient wearing?: Pull over shirt    Upper body assist Assist Level: Independent with assistive device    Lower Body Dressing/Undressing Lower body dressing      What is the patient wearing?: Pants     Lower body assist Assist for lower body dressing: Supervision/Verbal cueing     Toileting Toileting    Toileting assist Assist for toileting: Supervision/Verbal cueing     Transfers Chair/bed transfer  Transfers assist     Chair/bed transfer assist level: Supervision/Verbal cueing     Locomotion Ambulation   Ambulation assist      Assist level: Supervision/Verbal cueing Assistive device: Walker-rolling Max distance: 64ft   Walk 10 feet activity   Assist     Assist level: Supervision/Verbal cueing Assistive device: Walker-rolling   Walk 50 feet activity   Assist Walk 50 feet with 2 turns activity did not occur: Safety/medical concerns (fatigue)  Assist level: Supervision/Verbal cueing Assistive device: Walker-rolling    Walk 150 feet activity   Assist Walk 150 feet activity did not occur: Safety/medical concerns (R BKA, generalized weakness, decreased balance/postural control)         Walk 10 feet on uneven surface  activity   Assist Walk 10 feet on uneven surfaces activity did not occur: Safety/medical concerns  Assist level: Contact Guard/Touching assist Assistive device: Photographer Will patient use wheelchair at discharge?: Yes Type of Wheelchair: Manual    Wheelchair assist level: Independent Max wheelchair distance: >154ft    Wheelchair 50 feet with 2 turns activity    Assist        Assist Level: Independent   Wheelchair 150 feet activity      Assist      Assist Level: Independent   Blood pressure 136/86, pulse 91, temperature 99.8 F (37.7 C), temperature source Oral, resp. rate 18, height 5\' 6"  (1.676 m), weight 71.9 kg, SpO2 100 %.  Medical Problem List and Plan: 1.Decreased functional abilitysecondary to right BKA 06/08/2020 due to necrotizing fasciitis.   DC today  Will see patient for hospital follow up in 1 month post-discharge 2. Antithrombotics: -DVT/anticoagulation:Subcutaneous heparin -antiplatelet therapy: N/A 3. Pain Management:Oxycodone as needed  Controlled with meds 11/11 4. Mood:Provide emotional support -antipsychotic agents: N/A 5. Neuropsych: This patientiscapable of making decisions on hisown behalf. 6. Skin/Wound Care:              Continue stump shrinker, no need for Telfa 7. Fluids/Electrolytes/Nutrition:Routine in and outs. 8.  CKD:   Creatinine 1.41 on 11/10  Encourage hydration 9. Diabetes mellitus peripheral neuropathy. Hemoglobin A1c 14.5. Lantus insulin 30 units daily. Diabetic teaching  Lantus 10 nightly started on 11/1, increased to 20 on 11/3  Increase Lantus to 21 U 11/5, increased to 22 on 11/6  Slightly labile on 11/11, monitor in ambulatory setting for further adjustments  Monitor with increased mobility 10. Hypertension/sinus tachycardia.   Lisinopril 10 mg daily, increased to 20 on 11/1  HCTZ 12.5 mg daily, Norvasc 10 mg daily.  ECG showing sinus tachycardia  Metoprolol 12.5 twice daily started on 11/3  Slightly labile BP/HR, monitor in ambulatory setting with further adjustments 11. 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 Patient reportedly independent prior to admission. 12. Hyperlipidemia. Resume Lipitor 13. ID: wound cx negative.    Completed course of vanc and zosyn.     WBCs 7.7 on 11/10  Afebrile 14.  Hypokalemia  Potassium 4.1 on 11/10  Supplemented x3 again 15.?  Anemia of  chronic disease  Hemoglobin 11.2 on 11/10  Continue to monitor  16.  Constipation  Bowel meds started 11/9   Improving  > 30 minutes spent in total in discharge planning between myself and PA regarding aforementioned, as well discussion regarding follow-up appointments, follow-up therapies, discharge medications, discharge recommendations  LOS: 13 days A FACE TO FACE EVALUATION WAS PERFORMED  Valrie Jia 13/9 06/24/2020, 12:19 PM

## 2020-06-24 NOTE — Plan of Care (Signed)
  Problem: Education: Goal: Knowledge of General Education information will improve Description: Including pain rating scale, medication(s)/side effects and non-pharmacologic comfort measures 06/24/2020 0955 by Mikki Harbor, RN Outcome: Completed/Met 06/24/2020 0955 by Mikki Harbor, RN Outcome: Progressing   Problem: Education: Goal: Knowledge of General Education information will improve Description: Including pain rating scale, medication(s)/side effects and non-pharmacologic comfort measures 06/24/2020 0955 by Mikki Harbor, RN Outcome: Completed/Met 06/24/2020 0955 by Mikki Harbor, RN Outcome: Progressing   Problem: Health Behavior/Discharge Planning: Goal: Ability to manage health-related needs will improve 06/24/2020 0955 by Mikki Harbor, RN Outcome: Completed/Met 06/24/2020 0955 by Mikki Harbor, RN Outcome: Progressing   Problem: Clinical Measurements: Goal: Ability to maintain clinical measurements within normal limits will improve 06/24/2020 0955 by Mikki Harbor, RN Outcome: Completed/Met 06/24/2020 0955 by Mikki Harbor, RN Outcome: Progressing

## 2020-06-24 NOTE — Progress Notes (Addendum)
Patient ID: Ricky [name], male   DOB: Jul 29, 1966, 54 y.o.   MRN: [id number] Attempting to get his equipment here, daughter to call [employer] to give credit card number. Made aware can not discharge without it  10:00 Am Daughter has given credit card number and delivery tech to bring to pt's room shortly.

## 2020-06-24 NOTE — Progress Notes (Signed)
Pt discharge education completed by PA. Pt assisted into wheelchair and family assisted to pack all belongings. Assisted down to vehicle by staff.

## 2020-06-24 NOTE — Progress Notes (Signed)
Inpatient Rehabilitation Care Coordinator  Discharge Note  The overall goal for the admission was met for:   Discharge location: Yes-HOME WITH MOM WHO CAN PROVIDE 24 HR SUPERVISION LEVEL  Length of Stay: Yes-13 DAYS  Discharge activity level: Yes-SUPERVISION-CGA LEVEL  Home/community participation: Yes  Services provided included: MD, RD, PT, OT, RN, CM, TR, Pharmacy, Neuropsych and SW  Financial Services: Other: [employer] PENDING  Follow-up services arranged: DME: [facility]-WHEELCHAIR, RW, DROP-ARM BSC, TUB SEAT  Comments (or additional information):MICHELLE WAS HERE FOR EDUCATION AND GETTING PT IN AND OUT OF GRANDMOTHER'S HOME-4 STAIRS. PT HAS APPLIED FOR SSD AND MEDICAID. [facility] DOES NOT GO TO [place] [facility] SO CAN NOT GET HOME HEALTH AT DISCHARGE. PT [facility] UP WITH [facility] [place] HAS BEN A PATIENT THERE BEFORE. GAVE PAPERWORK FOR THIS. MATCH GIVEN FOR ASSIST WITH MEDICATIONS FOR THE MONTH  Patient/Family verbalized understanding of follow-up arrangements: Yes  Individual responsible for coordination of the follow-up plan: MICHELLE-DAUGHTER [phone]  Confirmed correct DME delivered: Elease Hashimoto 06/24/2020    Elease Hashimoto

## 2020-06-24 NOTE — Progress Notes (Signed)
Recreational Therapy Discharge Summary Patient Details  Name: Ricky [name] MRN: [id number] Date of Birth: Nov 04, 1965 Today's Date: 06/24/2020  Long term goals set: 1  Long term goals met: 1  Comments on progress toward goals: Pt has made good progress during LOS and is discharging home today with family to provide/coordiante supervision.  TR sessions focused on leisure education, activity analysis/modifications, energy conservation.  Pt has been anxious to return home with his familyt  Pt propelled w/c throughout the unit with supervision and outside on even/uneven surfaces with supervision.  Reasons for discharge: discharge from hospital  Patient/family agrees with progress made and goals achieved: Yes  Younique Casad 06/24/2020, 10:56 AM

## 2020-06-24 NOTE — Plan of Care (Signed)

## 2020-06-28 ENCOUNTER — Telehealth: Payer: Self-pay

## 2020-06-28 NOTE — Telephone Encounter (Signed)
Transition Care Management Unsuccessful Follow-up Telephone Call  Date of discharge and from where:  06/25/2020 [facility]  Attempts:  1st Attempt  Reason for unsuccessful TCM follow-up call:  Voice mail full

## 2020-07-01 ENCOUNTER — Ambulatory Visit (INDEPENDENT_AMBULATORY_CARE_PROVIDER_SITE_OTHER): Payer: Self-pay | Admitting: General Surgery

## 2020-07-01 ENCOUNTER — Encounter: Payer: Self-pay | Admitting: General Surgery

## 2020-07-01 ENCOUNTER — Other Ambulatory Visit: Payer: Self-pay

## 2020-07-01 VITALS — BP 105/71 | HR 95 | Temp 98.7°F | Resp 16 | Ht 65.0 in | Wt 140.0 lb

## 2020-07-01 DIAGNOSIS — M7989 Other specified soft tissue disorders: Secondary | ICD-10-CM

## 2020-07-01 NOTE — Progress Notes (Signed)
[facility] Note   HPI:  54 y.o. 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 He was released from rehab and has been at home. He is doing ok. He is using a wheelchair. He has his stump shrinker in place.   Review of Systems:  Pain in the stump at times  All other review of systems: otherwise negative   Vital Signs:  BP 105/71   Pulse 95   Temp 98.7 F (37.1 C) (Oral)   Resp 16   Ht 5\' 5"  (1.651 m)   Wt 140 lb (63.5 kg) Comment: per pt  SpO2 94%   BMI 23.30 kg/m    Physical Exam:  Physical Exam Vitals reviewed.  Cardiovascular:     Rate and Rhythm: Normal rate.  Pulmonary:     Effort: Pulmonary effort is normal.  Musculoskeletal:     Comments: Right BKA healing, no erythema or drainage, 1/2 staple removed, stump shrinker replaced   Neurological:     Mental Status: He is alert.      Assessment:  54 y.o. yo Male s/p R BKA doing well and healing. Half of the staples were removed.  Plan:  Continue exercises and therapy at home as instructed. Will see back in 2 weeks to remove more staples.  Will have to get referred for prosthetic at some point. Asked about his PCP follow up and he said he is suppose to go to the free clinic.   Future Appointments  Date Time Provider Department Center  07/07/2020 11:20 AM 07/09/2020, NP CPR-PRMA CPR  07/15/2020  2:30 PM 14/09/2019, MD RS-RS None     Lucretia Roers, MD [facility] [address] [place], [place] [phone] (office)

## 2020-07-01 NOTE — Patient Instructions (Signed)
Continue exercises and therapy at home as instructed. Will see back in 2 weeks to remove more staples.

## 2020-07-06 ENCOUNTER — Other Ambulatory Visit: Payer: Self-pay

## 2020-07-06 ENCOUNTER — Encounter: Payer: Self-pay | Admitting: Registered Nurse

## 2020-07-06 ENCOUNTER — Encounter: Payer: Self-pay | Attending: Registered Nurse | Admitting: Registered Nurse

## 2020-07-06 VITALS — BP 126/79 | HR 85 | Temp 98.8°F

## 2020-07-06 DIAGNOSIS — E7849 Other hyperlipidemia: Secondary | ICD-10-CM | POA: Insufficient documentation

## 2020-07-06 DIAGNOSIS — Z794 Long term (current) use of insulin: Secondary | ICD-10-CM | POA: Insufficient documentation

## 2020-07-06 DIAGNOSIS — Z89511 Acquired absence of right leg below knee: Secondary | ICD-10-CM | POA: Insufficient documentation

## 2020-07-06 DIAGNOSIS — E119 Type 2 diabetes mellitus without complications: Secondary | ICD-10-CM | POA: Insufficient documentation

## 2020-07-06 DIAGNOSIS — I10 Essential (primary) hypertension: Secondary | ICD-10-CM | POA: Insufficient documentation
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
## 2020-07-07 ENCOUNTER — Encounter: Payer: Self-pay | Admitting: Registered Nurse

## 2020-07-15 ENCOUNTER — Ambulatory Visit (INDEPENDENT_AMBULATORY_CARE_PROVIDER_SITE_OTHER): Payer: Self-pay | Admitting: General Surgery

## 2020-07-15 ENCOUNTER — Other Ambulatory Visit: Payer: Self-pay

## 2020-07-15 VITALS — BP 115/77 | HR 90 | Temp 99.5°F | Resp 12

## 2020-07-15 DIAGNOSIS — M7989 Other specified soft tissue disorders: Secondary | ICD-10-CM

## 2020-07-15 NOTE — Patient Instructions (Addendum)
Steri strips will stay in place 7+ days. Do not peel up until peeling off themselves. Shower but no submerging leg.  Ok to wash leg with wash cloth gentleman with mild soap.

## 2020-07-15 NOTE — Progress Notes (Signed)
[facility] Note   HPI:  54 y.o. 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 He is doing well but did fall he reported on his stump. No bleeding or issues he reports.   Review of Systems:  Improving soreness Dry skin on stump No drainage or bleeding  All other review of systems: otherwise negative   Vital Signs:  BP 115/77   Pulse 90   Temp 99.5 F (37.5 C) (Oral)   Resp 12   SpO2 96%    Physical Exam:  Physical Exam Vitals reviewed.  Cardiovascular:     Rate and Rhythm: Normal rate.  Pulmonary:     Effort: Pulmonary effort is normal.  Musculoskeletal:     Comments: Right BKA with staples, remaining removed, flaking skin washed off, area cleansed, steri strips placed on incision, no erythema or drainage, some scabbing      Assessment:  54 y.o. 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 Doing well overall but did fall at one point. Stump intact.  Plan:  - Will need to get referred for prosthetics   - He is suppose to be seeing the free clinic but he is unsure of appts, told him this was critical to get his medications / insulin  - Will see in a few weeks - Steri strips will stay in place 7+ days. Do not peel up until peeling off themselves. Shower but no submerging leg. Ok to wash leg with wash cloth gentleman with mild soap.   Future Appointments  Date Time Provider Department Center  08/10/2020  9:00 AM Marcello Fennel, MD CPR-PRMA CPR  08/17/2020 10:45 AM Lucretia Roers, MD RS-RS None     Algis Greenhouse, MD [facility] [address] [place], [place] [address] [phone] (office)

## 2020-07-19 ENCOUNTER — Other Ambulatory Visit (HOSPITAL_COMMUNITY)
Admission: RE | Admit: 2020-07-19 | Discharge: 2020-07-19 | Disposition: A | Discharge status: Home / Self Care | Payer: Self-pay | Source: Ambulatory Visit | Attending: Physician Assistant | Admitting: Physician Assistant

## 2020-07-19 ENCOUNTER — Ambulatory Visit: Payer: Self-pay | Admitting: Physician Assistant

## 2020-07-19 ENCOUNTER — Other Ambulatory Visit: Payer: Self-pay | Admitting: Physician Assistant

## 2020-07-19 ENCOUNTER — Encounter: Payer: Self-pay | Admitting: Physician Assistant

## 2020-07-19 VITALS — BP 120/70 | HR 93 | Temp 96.6°F

## 2020-07-19 DIAGNOSIS — Z125 Encounter for screening for malignant neoplasm of prostate: Secondary | ICD-10-CM

## 2020-07-19 DIAGNOSIS — E1165 Type 2 diabetes mellitus with hyperglycemia: Secondary | ICD-10-CM | POA: Insufficient documentation

## 2020-07-19 DIAGNOSIS — I10 Essential (primary) hypertension: Secondary | ICD-10-CM

## 2020-07-19 DIAGNOSIS — E785 Hyperlipidemia, unspecified: Secondary | ICD-10-CM | POA: Insufficient documentation

## 2020-07-19 DIAGNOSIS — D638 Anemia in other chronic diseases classified elsewhere: Secondary | ICD-10-CM

## 2020-07-19 DIAGNOSIS — E118 Type 2 diabetes mellitus with unspecified complications: Secondary | ICD-10-CM | POA: Insufficient documentation

## 2020-07-19 DIAGNOSIS — Z1211 Encounter for screening for malignant neoplasm of colon: Secondary | ICD-10-CM

## 2020-07-19 DIAGNOSIS — IMO0002 Reserved for concepts with insufficient information to code with codable children: Secondary | ICD-10-CM

## 2020-07-19 DIAGNOSIS — K5900 Constipation, unspecified: Secondary | ICD-10-CM

## 2020-07-19 DIAGNOSIS — Z7689 Persons encountering health services in other specified circumstances: Secondary | ICD-10-CM

## 2020-07-19 LAB — CBC
HCT: 36.7 % — ABNORMAL LOW (ref 39.0–52.0)
Hemoglobin: 11.7 g/dL — ABNORMAL LOW (ref 13.0–17.0)
MCH: 26.6 pg (ref 26.0–34.0)
MCHC: 31.9 g/dL (ref 30.0–36.0)
MCV: 83.4 fL (ref 80.0–100.0)
Platelets: 397 10*3/uL (ref 150–400)
RBC: 4.4 MIL/uL (ref 4.22–5.81)
RDW: 13.8 % (ref 11.5–15.5)
WBC: 9.6 10*3/uL (ref 4.0–10.5)
nRBC: 0 % (ref 0.0–0.2)

## 2020-07-19 LAB — LIPID PANEL
Cholesterol: 132 mg/dL (ref 0–200)
HDL: 27 mg/dL — ABNORMAL LOW (ref >40)
LDL Cholesterol: 62 mg/dL (ref 0–99)
Total CHOL/HDL Ratio: 4.9 RATIO
Triglycerides: 213 mg/dL — ABNORMAL HIGH (ref <150)
VLDL: 43 mg/dL — ABNORMAL HIGH (ref 0–40)

## 2020-07-19 LAB — HEMOGLOBIN A1C
Hgb A1c MFr Bld: 9.6 % — ABNORMAL HIGH (ref 4.8–5.6)
Mean Plasma Glucose: 228.82 mg/dL

## 2020-07-19 LAB — COMPREHENSIVE METABOLIC PANEL
ALT: 17 U/L (ref 0–44)
AST: 12 U/L — ABNORMAL LOW (ref 15–41)
Albumin: 3.7 g/dL (ref 3.5–5.0)
Alkaline Phosphatase: 70 U/L (ref 38–126)
Anion gap: 9 (ref 5–15)
BUN: 20 mg/dL (ref 6–20)
CO2: 26 mmol/L (ref 22–32)
Calcium: 9.3 mg/dL (ref 8.9–10.3)
Chloride: 103 mmol/L (ref 98–111)
Creatinine, Ser: 1.22 mg/dL (ref 0.61–1.24)
GFR, Estimated: >60 mL/min (ref >60)
Glucose, Bld: 103 mg/dL — ABNORMAL HIGH (ref 70–99)
Potassium: 3.9 mmol/L (ref 3.5–5.1)
Sodium: 138 mmol/L (ref 135–145)
Total Bilirubin: 0.4 mg/dL (ref 0.3–1.2)
Total Protein: 7.8 g/dL (ref 6.5–8.1)

## 2020-07-19 LAB — PSA: Prostatic Specific Antigen: 0.53 ng/mL (ref 0.00–4.00)

## 2020-07-19 MED ORDER — ATORVASTATIN CALCIUM 20 MG PO TABS
20.0000 mg | ORAL_TABLET | Freq: Every day | ORAL | 0 refills | Status: DC
Start: 2020-07-19 — End: 2020-08-11

## 2020-07-19 MED ORDER — INSULIN GLARGINE 100 UNIT/ML SOLOSTAR PEN
52.0000 [IU] | PEN_INJECTOR | Freq: Every day | SUBCUTANEOUS | 0 refills | Status: DC
Start: 2020-07-19 — End: 2020-08-11

## 2020-07-19 MED ORDER — AMLODIPINE BESYLATE 10 MG PO TABS
10.0000 mg | ORAL_TABLET | Freq: Every day | ORAL | 0 refills | Status: DC
Start: 2020-07-19 — End: 2020-08-11

## 2020-07-19 MED ORDER — METOPROLOL TARTRATE 25 MG PO TABS
12.5000 mg | ORAL_TABLET | Freq: Two times a day (BID) | ORAL | 0 refills | Status: DC
Start: 2020-07-19 — End: 2020-08-11

## 2020-07-19 MED ORDER — HYDROCHLOROTHIAZIDE 12.5 MG PO CAPS
12.5000 mg | ORAL_CAPSULE | Freq: Every day | ORAL | 0 refills | Status: DC
Start: 2020-07-19 — End: 2020-08-11

## 2020-07-19 MED ORDER — LISINOPRIL 20 MG PO TABS
20.0000 mg | ORAL_TABLET | Freq: Every day | ORAL | 0 refills | Status: DC
Start: 2020-07-19 — End: 2020-08-11

## 2020-07-19 NOTE — Progress Notes (Signed)
BP 120/70   Pulse 93   Temp (!) 96.6 F (35.9 C)   SpO2 95%    Subjective:    Patient ID: Ricky [name], male    DOB: [birth year], 54 y.o.   MRN: [id number]  HPI: Ricky [name] is a 54 y.o. male presenting on 07/19/2020 for New Patient (Initial Visit)   HPI    Pt had a negative covid 19 screening questionnaire.    Pt is a 54yoM who is returning pt to establish care.  He was Last seen here 01/21/19.  Pt has long history of noncompliance and uncontrolled DM.    Pt was hospitalized October 2021 and had Right below-knee amputee.   Active Problems:   Anemia of chronic disease   Hypokalemia   Essential hypertension   Diabetic peripheral neuropathy   renal insufficiency   Postoperative pain     Drug induced constipation Hyperlipidemia Remote TBI    Cr- 1.41 - 08/24/19 Hg - 11.2 - 06/23/20 a1c-  14.5-  06/10/20   He says he is ready to do what he needs to get his diabetes controlled.  He is almost out of all his meds.  He is using lantus 30u am and 22 qhs.   His pill bottles are all empty but he says he still has some in his pill box.  He is not checking his bs at home.  He reports Some constipation.   When asked how much he drinks, he says He drinks when he gets thirsty.  He says he drinks a lot of apple jucie.     Relevant past medical, surgical, family and social history reviewed and updated as indicated. Interim medical history since our last visit reviewed. Allergies and medications reviewed and updated.    Current Outpatient Medications:  .  insulin glargine (LANTUS) 100 UNIT/ML Solostar Pen, Inject 30 Units into the skin daily. (Patient taking differently: Inject into the skin daily. Inject 30 units into the skin in the morning and inject 22 units into the skin at bedtime.), Disp: 15 mL, Rfl: 11 .  amLODipine (NORVASC) 10 MG tablet, Take 1 tablet (10 mg total) by mouth daily. (Patient not taking: Reported on 07/19/2020), Disp: 30 tablet, Rfl: 0 .   atorvastatin (LIPITOR) 20 MG tablet, Take 1 tablet (20 mg total) by mouth daily. (Patient not taking: Reported on 07/19/2020), Disp: 90 tablet, Rfl: 0 .  hydrochlorothiazide (MICROZIDE) 12.5 MG capsule, Take 1 capsule (12.5 mg total) by mouth daily. (Patient not taking: Reported on 07/19/2020), Disp: 30 capsule, Rfl: 0 .  insulin glargine (LANTUS) 100 UNIT/ML Solostar Pen, Inject 22 Units into the skin at bedtime. (Patient not taking: Reported on 07/19/2020), Disp: 15 mL, Rfl: 11 .  insulin glargine (LANTUS) 100 unit/mL SOPN, Inject 30 Units into the skin daily. (Patient not taking: Reported on 07/19/2020), Disp: 15 mL, Rfl: 11 .  lisinopril (ZESTRIL) 20 MG tablet, Take 1 tablet (20 mg total) by mouth daily. (Patient not taking: Reported on 07/19/2020), Disp: 30 tablet, Rfl: 0 .  metoprolol tartrate (LOPRESSOR) 25 MG tablet, Take 0.5 tablets (12.5 mg total) by mouth 2 (two) times daily. (Patient not taking: Reported on 07/19/2020), Disp: 60 tablet, Rfl: 0 .  oxyCODONE (OXY IR/ROXICODONE) 5 MG immediate release tablet, Take 1-2 tablets (5-10 mg total) by mouth every 4 (four) hours as needed for moderate pain. (Patient not taking: Reported on 07/19/2020), Disp: 30 tablet, Rfl: 0 .  polyethylene glycol (MIRALAX / GLYCOLAX) 17 g packet, Take 17 g by  mouth daily. (Patient not taking: Reported on 07/19/2020), Disp: 14 each, Rfl: 0     Review of Systems  Per HPI unless specifically indicated above     Objective:    BP 120/70   Pulse 93   Temp (!) 96.6 F (35.9 C)   SpO2 95%   Wt Readings from Last 3 Encounters:  07/01/20 140 lb (63.5 kg)  06/11/20 158 lb 8.2 oz (71.9 kg)  06/10/20 166 lb 10.7 oz (75.6 kg)    Physical Exam Vitals reviewed.  Constitutional:      General: He is not in acute distress.    Appearance: He is well-developed. He is ill-appearing.     Comments: Pt looks frail and has lost a lot of weight since last he was in our office.  HENT:     Head: Normocephalic and atraumatic.   Cardiovascular:     Rate and Rhythm: Normal rate and regular rhythm.  Pulmonary:     Effort: Pulmonary effort is normal. No respiratory distress.     Breath sounds: Normal breath sounds. No wheezing.  Abdominal:     General: Bowel sounds are normal.     Palpations: Abdomen is soft.     Tenderness: There is no abdominal tenderness.  Musculoskeletal:     Cervical back: Neck supple.     Left lower leg: No edema.     Comments: R BKA stump with steri-strips in place.  No swelling draining or tenderness.  Lymphadenopathy:     Cervical: No cervical adenopathy.  Skin:    General: Skin is warm and dry.  Neurological:     Mental Status: He is alert and oriented to person, place, and time.  Psychiatric:        Behavior: Behavior normal.            Assessment & Plan:    Encounter Diagnoses  Name Primary?  . Encounter to establish care Yes  . Uncontrolled type 2 diabetes mellitus with complication (HCC)   . Essential hypertension, benign   . Hyperlipidemia, unspecified hyperlipidemia type   . Screening for prostate cancer   . Anemia of chronic disease   . Constipation, unspecified constipation type   . Screening for colon cancer       -pt is counseled to monitor his bs -will Update labs -pt is given ifobt for colon cancer screening -pt to F/u with dr Henreitta Leber (surgeon)   Jan 4 as scheduled. -pt has F/u with dr Allena Katz (rehab dr) 12/28 as scheduled  -pt is counseled to Limit juice intake -he is encouraged to Drink at least 4-6 glasses/water daily which will help his constipation.  He can use miralax (whick is on his Rx list but he isn't using)  -DM Foot exam was updated -He got a blood sugar meter and strips.  He was counseled again on how to properly use the machin.  -discussed with pt that he can take his lantus Insulin all at one time  -pt to follow up in about 3 weeks with bs log.  He is to contact office sooner prn

## 2020-07-19 NOTE — Patient Instructions (Signed)
  Instructional video to collect stool for colon cancer screening:  [email or web address]

## 2020-07-20 ENCOUNTER — Ambulatory Visit: Payer: Self-pay | Admitting: Physician Assistant

## 2020-07-20 LAB — MICROALBUMIN, URINE: Microalb, Ur: 537.6 ug/mL — ABNORMAL HIGH

## 2020-07-27 ENCOUNTER — Telehealth: Payer: Self-pay

## 2020-07-27 NOTE — Telephone Encounter (Signed)
Called client to follow up for Ricky [name]/[facility] DM follow up. Client reports blood sugar this am was 146. Only checking blood sugar fasting each am. Client has supplies of meter and strips. Client reports he has all medications. He has cut out drinking apple juice and is drinking water. Client reports amputee stump healing well and he should be beginning process of prosthesis fitting soon.  Client has question about medications coming from [facility]. Will call [facility] to confirm active enrollment and let client know the status of call. Client is agreeable.  [id number] Called place to [facility] and message states they are in a staff meeting. Will attempt call back 07/28/20  Francee Nodal RN Ricky [name]/[facility]

## 2020-07-28 ENCOUNTER — Telehealth: Payer: Self-pay

## 2020-07-28 NOTE — Telephone Encounter (Signed)
At patient's request called [facility] to confirm he is actively enrolled. [facility] confirmed he is enrolled. Called client to let him know for his follow up visit on [facility]. Also discussed that this CM will plan to meet him after that appointment with his primary care and client is agreeable. Reminded client to take his medications to that appointment as well as his blood sugar logs. Client verbally expressed understanding.   Francee Nodal RN [employer]

## 2020-08-10 ENCOUNTER — Ambulatory Visit: Payer: Self-pay | Admitting: Physician Assistant

## 2020-08-10 ENCOUNTER — Encounter: Payer: Self-pay | Admitting: Physical Medicine & Rehabilitation

## 2020-08-11 ENCOUNTER — Encounter: Payer: Self-pay | Admitting: Physician Assistant

## 2020-08-11 ENCOUNTER — Ambulatory Visit: Payer: Self-pay | Admitting: Physician Assistant

## 2020-08-11 VITALS — BP 120/84 | HR 82 | Temp 98.2°F

## 2020-08-11 DIAGNOSIS — I10 Essential (primary) hypertension: Secondary | ICD-10-CM

## 2020-08-11 DIAGNOSIS — IMO0002 Reserved for concepts with insufficient information to code with codable children: Secondary | ICD-10-CM

## 2020-08-11 DIAGNOSIS — D638 Anemia in other chronic diseases classified elsewhere: Secondary | ICD-10-CM

## 2020-08-11 DIAGNOSIS — E785 Hyperlipidemia, unspecified: Secondary | ICD-10-CM

## 2020-08-11 DIAGNOSIS — Z91199 Patient's noncompliance with other medical treatment and regimen due to unspecified reason: Secondary | ICD-10-CM

## 2020-08-11 MED ORDER — LISINOPRIL 20 MG PO TABS
20.0000 mg | ORAL_TABLET | Freq: Every day | ORAL | 0 refills | Status: DC
Start: 2020-08-11 — End: 2020-11-17

## 2020-08-11 MED ORDER — AMLODIPINE BESYLATE 10 MG PO TABS
10.0000 mg | ORAL_TABLET | Freq: Every day | ORAL | 0 refills | Status: DC
Start: 2020-08-11 — End: 2020-11-17

## 2020-08-11 MED ORDER — INSULIN GLARGINE 100 UNIT/ML SOLOSTAR PEN: 55.0000 [IU] | PEN_INJECTOR | Freq: Every day | SUBCUTANEOUS | 0 refills | Status: DC

## 2020-08-11 MED ORDER — ATORVASTATIN CALCIUM 20 MG PO TABS
20.0000 mg | ORAL_TABLET | Freq: Every day | ORAL | 0 refills | Status: DC
Start: 2020-08-11 — End: 2020-11-17

## 2020-08-11 MED ORDER — METOPROLOL TARTRATE 25 MG PO TABS
12.5000 mg | ORAL_TABLET | Freq: Two times a day (BID) | ORAL | 0 refills | Status: DC
Start: 2020-08-11 — End: 2020-11-17

## 2020-08-11 MED ORDER — HYDROCHLOROTHIAZIDE 12.5 MG PO CAPS
12.5000 mg | ORAL_CAPSULE | Freq: Every day | ORAL | 0 refills | Status: DC
Start: 2020-08-11 — End: 2020-11-17

## 2020-08-11 NOTE — Progress Notes (Signed)
BP 120/84    Pulse 82    Temp 98.2 F (36.8 C)    SpO2 97%    Subjective:    Patient ID: Ricky [name], male    DOB: 1966-01-05, 54 y.o.   MRN: [id number]  HPI: Ricky [name] is a 54 y.o. male presenting on 08/11/2020 for Diabetes and Results (labs)   HPI   Pt had a negative covid 19 screening questionnaire.  Chief Complaint  Patient presents with   Diabetes   Results    labs     Pt did not bring his meds.  Again.  He says he has less than 2 weeks supply remaining.[name]    He is drinking more water and his BMs are moving better.  He did not bring his bs log.  He says he has had no lows.  He says usually around 145.  He is using 52units lantus.      Relevant past medical, surgical, family and social history reviewed and updated as indicated. Interim medical history since our last visit reviewed. Allergies and medications reviewed and updated.    Current Outpatient Medications:    insulin glargine (LANTUS) 100 UNIT/ML Solostar Pen, Inject 52 Units into the skin at bedtime., Disp: 10 mL, Rfl: 0   amLODipine (NORVASC) 10 MG tablet, Take 1 tablet (10 mg total) by mouth daily., Disp: 30 tablet, Rfl: 0   atorvastatin (LIPITOR) 20 MG tablet, Take 1 tablet (20 mg total) by mouth daily., Disp: 30 tablet, Rfl: 0   hydrochlorothiazide (MICROZIDE) 12.5 MG capsule, Take 1 capsule (12.5 mg total) by mouth daily., Disp: 30 capsule, Rfl: 0   insulin glargine (LANTUS) 100 unit/mL SOPN, Inject 30 Units into the skin daily. (Patient not taking: No sig reported), Disp: 15 mL, Rfl: 11   lisinopril (ZESTRIL) 20 MG tablet, Take 1 tablet (20 mg total) by mouth daily., Disp: 30 tablet, Rfl: 0   metoprolol tartrate (LOPRESSOR) 25 MG tablet, Take 0.5 tablets (12.5 mg total) by mouth 2 (two) times daily., Disp: 30 tablet, Rfl: 0   oxyCODONE (OXY IR/ROXICODONE) 5 MG immediate release tablet, Take 1-2 tablets (5-10 mg total) by mouth every 4 (four) hours as needed for moderate pain.  (Patient not taking: No sig reported), Disp: 30 tablet, Rfl: 0   polyethylene glycol (MIRALAX / GLYCOLAX) 17 g packet, Take 17 g by mouth daily. (Patient not taking: No sig reported), Disp: 14 each, Rfl: 0   Review of Systems  Per HPI unless specifically indicated above     Objective:    BP 120/84    Pulse 82    Temp 98.2 F (36.8 C)    SpO2 97%   Wt Readings from Last 3 Encounters:  07/01/20 140 lb (63.5 kg)  06/11/20 158 lb 8.2 oz (71.9 kg)  06/10/20 166 lb 10.7 oz (75.6 kg)    Physical Exam Vitals reviewed.  Constitutional:      General: He is not in acute distress.    Appearance: He is well-developed.  HENT:     Head: Normocephalic and atraumatic.  Cardiovascular:     Rate and Rhythm: Normal rate and regular rhythm.  Pulmonary:     Effort: Pulmonary effort is normal. No respiratory distress.     Breath sounds: Normal breath sounds. No wheezing.  Abdominal:     General: Bowel sounds are normal.     Palpations: Abdomen is soft.     Tenderness: There is no abdominal tenderness.  Musculoskeletal:  Cervical back: Neck supple.     Left lower leg: No edema.     Comments: R BKA stump.  Pt in wheelchair.  Lymphadenopathy:     Cervical: No cervical adenopathy.  Skin:    General: Skin is warm and dry.  Neurological:     Mental Status: He is alert and oriented to person, place, and time.  Psychiatric:        Behavior: Behavior normal.     Results for orders placed or performed during the hospital encounter of 07/19/20  CBC  Result Value Ref Range   WBC 9.6 4.0 - 10.5 K/uL   RBC 4.40 4.22 - 5.81 MIL/uL   Hemoglobin 11.7 (L) 13.0 - 17.0 g/dL   HCT 70.1 (L) 77.9 - 39.0 %   MCV 83.4 80.0 - 100.0 fL   MCH 26.6 26.0 - 34.0 pg   MCHC 31.9 30.0 - 36.0 g/dL   RDW 30.0 92.3 - 30.0 %   Platelets 397 150 - 400 K/uL   nRBC 0.0 0.0 - 0.2 %  Microalbumin, urine  Result Value Ref Range   Microalb, Ur 537.6 (H) Not Estab. ug/mL  PSA  Result Value Ref Range   Prostatic  Specific Antigen 0.53 0.00 - 4.00 ng/mL  Lipid panel  Result Value Ref Range   Cholesterol 132 0 - 200 mg/dL   Triglycerides 762 (H) <150 mg/dL   HDL 27 (L) >26 mg/dL   Total CHOL/HDL Ratio 4.9 RATIO   VLDL 43 (H) 0 - 40 mg/dL   LDL Cholesterol 62 0 - 99 mg/dL  Comprehensive metabolic panel  Result Value Ref Range   Sodium 138 135 - 145 mmol/L   Potassium 3.9 3.5 - 5.1 mmol/L   Chloride 103 98 - 111 mmol/L   CO2 26 22 - 32 mmol/L   Glucose, Bld 103 (H) 70 - 99 mg/dL   BUN 20 6 - 20 mg/dL   Creatinine, Ser 3.33 0.61 - 1.24 mg/dL   Calcium 9.3 8.9 - 54.5 mg/dL   Total Protein 7.8 6.5 - 8.1 g/dL   Albumin 3.7 3.5 - 5.0 g/dL   AST 12 (L) 15 - 41 U/L   ALT 17 0 - 44 U/L   Alkaline Phosphatase 70 38 - 126 U/L   Total Bilirubin 0.4 0.3 - 1.2 mg/dL   GFR, Estimated >62 >56 mL/min   Anion gap 9 5 - 15  Hemoglobin A1c  Result Value Ref Range   Hgb A1c MFr Bld 9.6 (H) 4.8 - 5.6 %   Mean Plasma Glucose 228.82 mg/dL      Assessment & Plan:    Encounter Diagnoses  Name Primary?   Uncontrolled type 2 diabetes mellitus with complication (HCC) Yes   Essential hypertension, benign    Personal history of noncompliance with medical treatment, presenting hazards to health    Anemia of chronic disease    Hyperlipidemia, unspecified hyperlipidemia type      -reviewed labs with pt  -discussed with pt the importance of bringing his medications to every appointment and bs log sheet with him to his appointments -increase lantus to 55units.  Pt to monitor bs.  He is reminded to call offie for fbs < 70 or > 300.  He is reminded to bring his bs log with him to his appointment -pt to follow up 3 months.  He is to contact office sooner prn

## 2020-08-17 ENCOUNTER — Encounter: Payer: Self-pay | Admitting: General Surgery

## 2020-08-17 ENCOUNTER — Other Ambulatory Visit: Payer: Self-pay

## 2020-08-17 ENCOUNTER — Ambulatory Visit (INDEPENDENT_AMBULATORY_CARE_PROVIDER_SITE_OTHER): Payer: Self-pay | Admitting: General Surgery

## 2020-08-17 VITALS — BP 113/70 | HR 90 | Temp 98.2°F | Resp 12 | Wt 159.0 lb

## 2020-08-17 DIAGNOSIS — Z89511 Acquired absence of right leg below knee: Secondary | ICD-10-CM

## 2020-08-17 DIAGNOSIS — M7989 Other specified soft tissue disorders: Secondary | ICD-10-CM

## 2020-08-17 NOTE — Progress Notes (Signed)
[facility] Note   HPI:  55 y.o. 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 He has been to the place to get his prosthetic and is doing well. He has no complaints.  Review of Systems:  No pain No drainage All other review of systems: otherwise negative   Vital Signs:  BP 113/70   Pulse 90   Temp 98.2 F (36.8 C) (Oral)   Resp 12   Wt 159 lb (72.1 kg)   SpO2 97%   BMI 26.46 kg/m    Physical Exam:  Physical Exam Vitals reviewed.  Musculoskeletal:     Comments: Right BKA stump healing, steri strips removed, no erythema or drainage      Assessment:  55 y.o. yo Male with healing BKA stump.  Plan:  Continue to follow with prosthetic people. Will refer you to podiatry. Call with questions or concerns.   PRN follow up   Algis Greenhouse, MD [facility] [address] [place], [place] [address] [phone] (office)

## 2020-08-17 NOTE — Patient Instructions (Signed)
Continue to follow with prosthetic people. Will refer you to podiatry. Call with questions or concerns.

## 2020-09-29 ENCOUNTER — Telehealth: Payer: Self-pay

## 2020-09-29 NOTE — Telephone Encounter (Signed)
Called to follow up with client regarding Diabetes. Client is current with [facility]. His next appointment is March 29.  He states he has been released by surgeons. He is asking for resource to assist financially with a prosthesis. Will research. Client reports his glucometer is now working and he is keeping logs for his provider at [facility]. He reports his FBS this am was in 200s he reports he is taking his lantus. He is no longer drinking sodas but is drinking water, he does admit to eating fruit discussed amounts of acceptable fruit exchange and that fruits although a natural sugar still in greater amounts affect blood sugars.  Plan to meet with client after his next [facility] appointment on 11/09/20 at 0830am. Client agreeable.  Francee Nodal RN [employer]

## 2020-10-26 ENCOUNTER — Other Ambulatory Visit: Payer: Self-pay | Admitting: Physician Assistant

## 2020-10-26 DIAGNOSIS — E1165 Type 2 diabetes mellitus with hyperglycemia: Secondary | ICD-10-CM

## 2020-10-26 DIAGNOSIS — E785 Hyperlipidemia, unspecified: Secondary | ICD-10-CM

## 2020-10-26 DIAGNOSIS — IMO0002 Reserved for concepts with insufficient information to code with codable children: Secondary | ICD-10-CM

## 2020-10-26 DIAGNOSIS — I10 Essential (primary) hypertension: Secondary | ICD-10-CM

## 2020-11-09 ENCOUNTER — Ambulatory Visit: Payer: Self-pay | Admitting: Physician Assistant

## 2020-11-09 ENCOUNTER — Telehealth: Payer: Self-pay

## 2020-11-09 NOTE — Telephone Encounter (Signed)
Returned call to client. He was scheduled this am for an appointment at [facility] and his daughter did not call or let him know that she was not picking him up. Client upset to miss appointment. New appointment rescheduled by the clinic for 11/17/20 at 1030. Will use [facility] [employer] to assure client is not missing next appointment. Client is agreeable. Discussed process and when he should get a call back to confirm pickup times.  Client reports he is checking his blood sugars and he is running last readings 180s - 190s. He states he is not drinking sugary drinks and is following a diet watching carbohydrate intake. His mother prepares his meals. He is still awaiting disability determination as well as medicaid which he states was started in the hospital through [facility]. He is hopeful with disability he will be able to get his prosthesis.   Will continue to follow.    Francee Nodal RN [employer]

## 2020-11-15 ENCOUNTER — Encounter: Payer: Self-pay | Admitting: Physician Assistant

## 2020-11-16 ENCOUNTER — Telehealth: Payer: Self-pay | Admitting: Physician Assistant

## 2020-11-16 NOTE — Telephone Encounter (Signed)
   Ricky [name] DOB: 01/22/1966 MRN: [id number]   RIDER WAIVER AND RELEASE OF LIABILITY  For purposes of improving physical access to our facilities, [facility] is pleased to partner with third parties to provide [facility] patients or other authorized individuals the option of convenient, on-demand ground transportation services (the [employer]") through use of the technology service that enables users to request on-demand ground transportation from independent third-party providers.  By opting to use and accept these [employer], I, the undersigned, hereby agree on behalf of myself, and on behalf of any minor child using the [employer] for whom I am the parent or legal guardian, as follows:  1. Science writer provided to me are provided by independent third-party transportation providers who are not [employer] or employees and who are unaffiliated with [employer]. 2. [facility] is neither a transportation carrier nor a common or public carrier. 3. [facility] has no control over the quality or safety of the transportation that occurs as a result of the [employer]. 4. [facility] cannot guarantee that any third-party transportation provider will complete any arranged transportation service. 5. [facility] makes no representation, warranty, or guarantee regarding the reliability, timeliness, quality, safety, suitability, or availability of any of the Transport Services or that they will be error free. 6. I fully understand that traveling by vehicle involves risks and dangers of serious bodily injury, including permanent disability, paralysis, and death. I agree, on behalf of myself and on behalf of any minor child using the Transport Services for whom I am the parent or legal guardian, that the entire risk arising out of my use of the [employer] remains solely with me, to the maximum extent permitted under applicable law. 7. The [employer] are provided "as is" and "as available." [place] disclaims all representations and warranties, express, implied or statutory, not expressly set out in these terms, including the implied warranties of merchantability and fitness for a particular purpose. 8. I hereby waive and release [facility], its agents, employees, officers, directors, representatives, insurers, attorneys, assigns, successors, subsidiaries, and affiliates from any and all past, present, or future claims, demands, liabilities, actions, causes of action, or suits of any kind directly or indirectly arising from acceptance and use of the [employer]. 9. I further waive and release [facility] and its affiliates from all present and future liability and responsibility for any injury or death to persons or damages to property caused by or related to the use of the [employer]. 10. I have read this Waiver and Release of Liability, and I understand the terms used in it and their legal significance. This Waiver is freely and voluntarily given with the understanding that my right (as well as the right of any minor child for whom I am the parent or legal guardian using the [employer]) to legal recourse against  in connection with the [employer] is knowingly surrendered in return for use of these services.   I attest that I read the consent document to Ricky [name], gave Mr. Candelas the opportunity to ask questions and answered the questions asked (if any). I affirm that Ricky [name] then provided consent for he's participation in this program.     Launa Grill

## 2020-11-17 ENCOUNTER — Encounter: Payer: Self-pay | Admitting: Physician Assistant

## 2020-11-17 ENCOUNTER — Ambulatory Visit: Payer: Medicaid Other | Admitting: Physician Assistant

## 2020-11-17 ENCOUNTER — Other Ambulatory Visit: Payer: Self-pay

## 2020-11-17 VITALS — BP 122/76 | HR 83 | Temp 96.9°F

## 2020-11-17 DIAGNOSIS — Z91199 Patient's noncompliance with other medical treatment and regimen due to unspecified reason: Secondary | ICD-10-CM

## 2020-11-17 DIAGNOSIS — Z89211 Acquired absence of right upper limb below elbow: Secondary | ICD-10-CM

## 2020-11-17 DIAGNOSIS — R011 Cardiac murmur, unspecified: Secondary | ICD-10-CM

## 2020-11-17 DIAGNOSIS — Z9119 Patient's noncompliance with other medical treatment and regimen: Secondary | ICD-10-CM

## 2020-11-17 DIAGNOSIS — IMO0002 Reserved for concepts with insufficient information to code with codable children: Secondary | ICD-10-CM

## 2020-11-17 DIAGNOSIS — E1165 Type 2 diabetes mellitus with hyperglycemia: Secondary | ICD-10-CM

## 2020-11-17 DIAGNOSIS — E785 Hyperlipidemia, unspecified: Secondary | ICD-10-CM

## 2020-11-17 DIAGNOSIS — I10 Essential (primary) hypertension: Secondary | ICD-10-CM

## 2020-11-17 MED ORDER — LISINOPRIL 20 MG PO TABS: 20.0000 mg | ORAL_TABLET | Freq: Every day | ORAL | 0 refills | Status: AC

## 2020-11-17 MED ORDER — METOPROLOL TARTRATE 25 MG PO TABS
12.5000 mg | ORAL_TABLET | Freq: Two times a day (BID) | ORAL | 0 refills | Status: DC
Start: 2020-11-17 — End: 2024-06-19

## 2020-11-17 MED ORDER — INSULIN GLARGINE 100 UNIT/ML SOLOSTAR PEN
60.0000 [IU] | PEN_INJECTOR | Freq: Every day | SUBCUTANEOUS | 0 refills | Status: DC
Start: 2020-11-17 — End: 2021-03-01

## 2020-11-17 MED ORDER — HYDROCHLOROTHIAZIDE 12.5 MG PO CAPS
12.5000 mg | ORAL_CAPSULE | Freq: Every day | ORAL | 0 refills | Status: DC
Start: 2020-11-17 — End: 2024-06-19

## 2020-11-17 MED ORDER — AMLODIPINE BESYLATE 10 MG PO TABS: 10.0000 mg | ORAL_TABLET | Freq: Every day | ORAL | 0 refills | Status: AC

## 2020-11-17 MED ORDER — ATORVASTATIN CALCIUM 20 MG PO TABS
20.0000 mg | ORAL_TABLET | Freq: Every day | ORAL | 0 refills | Status: DC
Start: 2020-11-17 — End: 2021-06-22

## 2020-11-17 NOTE — Patient Instructions (Signed)
GET FASTING LABS DRAWN

## 2020-11-17 NOTE — Progress Notes (Signed)
BP 122/76   Pulse 83   Temp (!) 96.9 F (36.1 C)   SpO2 96%    Subjective:    Patient ID: Ricky [name], male    DOB: 1966/03/06, 55 y.o.   MRN: [id number]  HPI: Robel Wuertz is a 55 y.o. male presenting on 11/17/2020 for Diabetes, Hypertension, and Hyperlipidemia   HPI   Pt had a negative covid 19 screening questionnaire.   Chief Complaint  Patient presents with  . Diabetes  . Hypertension  . Hyperlipidemia    Pt was a no-show to his 11/09/20  appointment and he didn't get his labs drawn. Pt has only been using 52units lantus (supposed to be using 55units). He has bs log.  Readings all too high and have been increasing. He says he is doing good and he has no complaints.  He says his stump is all healed and not giving him any problems.    Relevant past medical, surgical, family and social history reviewed and updated as indicated. Interim medical history since our last visit reviewed. Allergies and medications reviewed and updated.   Current Outpatient Medications:  .  amLODipine (NORVASC) 10 MG tablet, Take 1 tablet (10 mg total) by mouth daily., Disp: 90 tablet, Rfl: 0 .  atorvastatin (LIPITOR) 20 MG tablet, Take 1 tablet (20 mg total) by mouth daily., Disp: 90 tablet, Rfl: 0 .  insulin glargine (LANTUS) 100 UNIT/ML Solostar Pen, Inject 55 Units into the skin at bedtime., Disp: 10 mL, Rfl: 0 .  lisinopril (ZESTRIL) 20 MG tablet, Take 1 tablet (20 mg total) by mouth daily., Disp: 90 tablet, Rfl: 0 .  metoprolol tartrate (LOPRESSOR) 25 MG tablet, Take 0.5 tablets (12.5 mg total) by mouth 2 (two) times daily., Disp: 90 tablet, Rfl: 0 .  hydrochlorothiazide (MICROZIDE) 12.5 MG capsule, Take 1 capsule (12.5 mg total) by mouth daily. (Patient not taking: Reported on 11/17/2020), Disp: 90 capsule, Rfl: 0     Review of Systems  Per HPI unless specifically indicated above     Objective:    BP 122/76   Pulse 83   Temp (!) 96.9 F (36.1 C)   SpO2 96%   Wt  Readings from Last 3 Encounters:  08/17/20 159 lb (72.1 kg)  07/01/20 140 lb (63.5 kg)  06/11/20 158 lb 8.2 oz (71.9 kg)    Physical Exam Constitutional:      General: He is not in acute distress.    Appearance: He is not toxic-appearing.  HENT:     Head: Normocephalic and atraumatic.  Cardiovascular:     Rate and Rhythm: Normal rate and regular rhythm.     Pulses: Normal pulses.     Heart sounds: Murmur heard.    Pulmonary:     Effort: Pulmonary effort is normal. No respiratory distress.     Breath sounds: Normal breath sounds. No wheezing or rhonchi.  Musculoskeletal:     Cervical back: No tenderness.     Left lower leg: No edema.     Comments: Stump well healed without signs infection     Right Lower Extremity: Right leg is amputated below knee.  Lymphadenopathy:     Cervical: No cervical adenopathy.  Skin:    General: Skin is warm and dry.  Neurological:     Mental Status: He is alert and oriented to person, place, and time.            Assessment & Plan:    Encounter Diagnoses  Name Primary?  [name]  Uncontrolled type 2 diabetes mellitus with complication (HCC) Yes  . Essential hypertension, benign   . Hyperlipidemia, unspecified hyperlipidemia type   . Personal history of noncompliance with medical treatment, presenting hazards to health   . Murmur, cardiac   . Right below-elbow amputee       -Increase lantus. To 60 units.  Pt to continue to monitor bs.  He is reminded to call office for fbs < 70 or > 300. -pt to Get fasting labs drawn -Refills sent -Will send for echo to evaluate murmur -will check with [facility] to see if pt has [facility] -Pt to follow up with virtual appointment in 2-3 weeks to discuss bs and review labs.  He is to contact office sooner prn

## 2020-11-19 ENCOUNTER — Other Ambulatory Visit: Payer: Self-pay

## 2020-11-19 ENCOUNTER — Other Ambulatory Visit (HOSPITAL_COMMUNITY)
Admission: RE | Admit: 2020-11-19 | Discharge: 2020-11-19 | Disposition: A | Discharge status: Home / Self Care | Payer: Medicaid Other | Source: Ambulatory Visit | Attending: Physician Assistant | Admitting: Physician Assistant

## 2020-11-19 DIAGNOSIS — E1165 Type 2 diabetes mellitus with hyperglycemia: Secondary | ICD-10-CM | POA: Diagnosis present

## 2020-11-19 DIAGNOSIS — E785 Hyperlipidemia, unspecified: Secondary | ICD-10-CM | POA: Diagnosis present

## 2020-11-19 DIAGNOSIS — E118 Type 2 diabetes mellitus with unspecified complications: Secondary | ICD-10-CM | POA: Insufficient documentation

## 2020-11-19 DIAGNOSIS — I10 Essential (primary) hypertension: Secondary | ICD-10-CM | POA: Diagnosis present

## 2020-11-19 DIAGNOSIS — IMO0002 Reserved for concepts with insufficient information to code with codable children: Secondary | ICD-10-CM

## 2020-11-19 DIAGNOSIS — R69 Illness, unspecified: Secondary | ICD-10-CM | POA: Diagnosis present

## 2020-11-19 LAB — LIPID PANEL
Cholesterol: 135 mg/dL (ref 0–200)
HDL: 33 mg/dL — ABNORMAL LOW (ref >40)
LDL Cholesterol: 65 mg/dL (ref 0–99)
Total CHOL/HDL Ratio: 4.1 RATIO
Triglycerides: 184 mg/dL — ABNORMAL HIGH (ref <150)
VLDL: 37 mg/dL (ref 0–40)

## 2020-11-19 LAB — COMPREHENSIVE METABOLIC PANEL
ALT: 41 U/L (ref 0–44)
AST: 22 U/L (ref 15–41)
Albumin: 3.7 g/dL (ref 3.5–5.0)
Alkaline Phosphatase: 81 U/L (ref 38–126)
Anion gap: 9 (ref 5–15)
BUN: 15 mg/dL (ref 6–20)
CO2: 26 mmol/L (ref 22–32)
Calcium: 9.6 mg/dL (ref 8.9–10.3)
Chloride: 104 mmol/L (ref 98–111)
Creatinine, Ser: 1.22 mg/dL (ref 0.61–1.24)
GFR, Estimated: >60 mL/min (ref >60)
Glucose, Bld: 235 mg/dL — ABNORMAL HIGH (ref 70–99)
Potassium: 4.4 mmol/L (ref 3.5–5.1)
Sodium: 139 mmol/L (ref 135–145)
Total Bilirubin: 0.6 mg/dL (ref 0.3–1.2)
Total Protein: 7.4 g/dL (ref 6.5–8.1)

## 2020-11-19 LAB — HEMOGLOBIN A1C
Hgb A1c MFr Bld: 10.6 % — ABNORMAL HIGH (ref 4.8–5.6)
Mean Plasma Glucose: 257.52 mg/dL

## 2020-11-25 ENCOUNTER — Encounter: Payer: Self-pay | Admitting: Physician Assistant

## 2020-12-08 ENCOUNTER — Ambulatory Visit: Payer: Medicaid Other | Admitting: Physician Assistant

## 2020-12-20 ENCOUNTER — Telehealth: Payer: Self-pay

## 2020-12-20 NOTE — Telephone Encounter (Signed)
Client called stating he is in need of transportation for 01/11/21 to [facility] and for 01/14/21 to [facility] for echocardiogram.  Reviewed client's recent glucose readings and he reports 140s to 200 range. When asked if he is keeping log sheets for the provider, he states "my mother is" Reviewed medications with client he reports he has them all and states confirmation he is taking 60 units of lantus at Bedtime as prescribed. Encouraged client to take all his medications with him to his next appointment along with his glucose logs. Client reports he does not know what sends his blood sugar up, denies sugary drinks and juices. Recommended client making note of what he eats at meals and get an idea of what he is consuming that increases his blood sugars.  Client requests a reminder call regarding taking these things to his appointment.  Francee Nodal RN [employer]

## 2021-01-11 ENCOUNTER — Ambulatory Visit: Payer: Medicaid Other | Admitting: Physician Assistant

## 2021-01-11 ENCOUNTER — Encounter: Payer: Self-pay | Admitting: Physician Assistant

## 2021-01-11 VITALS — BP 117/74 | HR 78 | Temp 98.1°F

## 2021-01-11 DIAGNOSIS — D638 Anemia in other chronic diseases classified elsewhere: Secondary | ICD-10-CM

## 2021-01-11 DIAGNOSIS — E1165 Type 2 diabetes mellitus with hyperglycemia: Secondary | ICD-10-CM

## 2021-01-11 DIAGNOSIS — E785 Hyperlipidemia, unspecified: Secondary | ICD-10-CM

## 2021-01-11 DIAGNOSIS — R011 Cardiac murmur, unspecified: Secondary | ICD-10-CM

## 2021-01-11 DIAGNOSIS — Z89211 Acquired absence of right upper limb below elbow: Secondary | ICD-10-CM

## 2021-01-11 DIAGNOSIS — IMO0002 Reserved for concepts with insufficient information to code with codable children: Secondary | ICD-10-CM

## 2021-01-11 DIAGNOSIS — Z9119 Patient's noncompliance with other medical treatment and regimen: Secondary | ICD-10-CM

## 2021-01-11 DIAGNOSIS — Z91199 Patient's noncompliance with other medical treatment and regimen due to unspecified reason: Secondary | ICD-10-CM

## 2021-01-11 DIAGNOSIS — I10 Essential (primary) hypertension: Secondary | ICD-10-CM

## 2021-01-11 NOTE — Progress Notes (Signed)
   BP 117/74   Pulse 78   Temp 98.1 F (36.7 C)   SpO2 96%    Subjective:    Patient ID: Ricky [name], male    DOB: 09-08-1965, 55 y.o.   MRN: [id number]  HPI: Ricky [name] is a 55 y.o. male presenting on 01/11/2021 for No chief complaint on file.   HPI    Pt had a negative covid 19 screening questionnaire.   Pt is 55yoM who presents for follow up.  While reviewing medications, it was discovered that pt has insurance so he is no longer eligible for treatment at [facility].   Effective 11/12/20- [facility]  [facility]      Relevant past medical, surgical, family and social history reviewed and updated as indicated. Interim medical history since our last visit reviewed. Allergies and medications reviewed and updated.  Review of Systems  Per HPI unless specifically indicated above     Objective:    BP 117/74   Pulse 78   Temp 98.1 F (36.7 C)   SpO2 96%   Wt Readings from Last 3 Encounters:  08/17/20 159 lb (72.1 kg)  07/01/20 140 lb (63.5 kg)  06/11/20 158 lb 8.2 oz (71.9 kg)    Physical Exam Vitals reviewed.  Constitutional:      General: He is not in acute distress. Pulmonary:     Effort: No respiratory distress.  Neurological:     Mental Status: He is alert and oriented to person, place, and time.  Psychiatric:        Attention and Perception: Attention normal.              Assessment & Plan:    Encounter Diagnoses  Name Primary?  [name] Uncontrolled type 2 diabetes mellitus with complication (HCC) Yes  . Essential hypertension, benign   . Hyperlipidemia, unspecified hyperlipidemia type   . Personal history of noncompliance with medical treatment, presenting hazards to health   . Right below-elbow amputee   . Anemia of chronic disease   . Murmur, cardiac      -pt is told to contact new PCP/[facility] to schedule appointment for continuing care.  -echo that was scheduled for 01/14/21 was cancelled as well

## 2021-01-11 NOTE — Congregational Nurse Program (Signed)
[facility]. Client is diabetic that is currently uncontrolled. History or Right BKA.  Client has now been approved for disability and has received his Medicaid Card in the mail with [facility] listed as provider. Client reports he has contacted them already and he is awaiting approval. Client given phone number to [facility].  Also discussed with client that he will no longer be able to use [facility] and that with his medicaid prescriptions should have for the most part a 3$-$4 co-pay. We discussed setting up [facility] now that he has medicaid for transportation to medical appointments and number given.  Client will contact this RN if he is not approved to become a patient with [facility] for other alternatives for medical care.  Client was scheduled for an Echo this week at [facility], that test ordered by [facility] provider will be cancelled and [facility] called and cancelled transportation for 01/14/21 to [facility].   Diabetes Education materials given to client along with myplate handout and discussed how to eat his meals. Also reviewed what his A1C means for his average blood sugars. Handout given and client showed how to see an average. Client urged to continue testing his sugar and follow any guidelines by his new provider.  [facility] called to take client back home.   UPDATE: 2:15PM Called client to let him know his echocardiogram was cancelled by provider at [facility] and his new provider could arrange any further testing. Client reports he has a new patient appointment at [facility] in [place] on 02/08/21 at 9:15am. Client has also called and set his [facility] transportation up for that appointment.  Client with no further needs at this time. Instructed client to call [facility] for any further resource needs. Client no longer eligible for [facility] due to obtaining insurance. Client is aware he may call for  resources.   Francee Nodal RN [employer]

## 2021-01-14 ENCOUNTER — Ambulatory Visit (HOSPITAL_COMMUNITY): Payer: MEDICAID

## 2021-01-24 ENCOUNTER — Other Ambulatory Visit: Payer: Self-pay | Admitting: Physician Assistant

## 2021-01-24 MED ORDER — INSULIN GLARGINE 100 UNIT/ML ~~LOC~~ SOLN: 60.0000 [IU] | Freq: Every day | SUBCUTANEOUS | 0 refills | Status: DC

## 2021-02-08 LAB — VITAMIN D 25 HYDROXY (VIT D DEFICIENCY, FRACTURES): Vit D, 25-Hydroxy: 16.2

## 2021-02-08 LAB — TSH: TSH: 1.69 (ref 0.41–5.90)

## 2021-02-23 ENCOUNTER — Encounter: Payer: Self-pay | Admitting: Nurse Practitioner

## 2021-02-28 NOTE — Patient Instructions (Signed)
Diabetes Mellitus and Nutrition, Adult When you have diabetes, or diabetes mellitus, it is very important to have healthy eating habits because your blood sugar (glucose) levels are greatly affected by what you eat and drink. Eating healthy foods in the right amounts, at about the same times every day, can help you:  Control your blood glucose.  Lower your risk of heart disease.  Improve your blood pressure.  Reach or maintain a healthy weight. What can affect my meal plan? Every person with diabetes is different, and each person has different needs for a meal plan. Your health care provider may recommend that you work with a dietitian to make a meal plan that is best for you. Your meal plan may vary depending on factors such as:  The calories you need.  The medicines you take.  Your weight.  Your blood glucose, blood pressure, and cholesterol levels.  Your activity level.  Other health conditions you have, such as heart or kidney disease. How do carbohydrates affect me? Carbohydrates, also called carbs, affect your blood glucose level more than any other type of food. Eating carbs naturally raises the amount of glucose in your blood. Carb counting is a method for keeping track of how many carbs you eat. Counting carbs is important to keep your blood glucose at a healthy level, especially if you use insulin or take certain oral diabetes medicines. It is important to know how many carbs you can safely have in each meal. This is different for every person. Your dietitian can help you calculate how many carbs you should have at each meal and for each snack. How does alcohol affect me? Alcohol can cause a sudden decrease in blood glucose (hypoglycemia), especially if you use insulin or take certain oral diabetes medicines. Hypoglycemia can be a life-threatening condition. Symptoms of hypoglycemia, such as sleepiness, dizziness, and confusion, are similar to symptoms of having too much  alcohol.  Do not drink alcohol if: ? Your health care provider tells you not to drink. ? You are pregnant, may be pregnant, or are planning to become pregnant.  If you drink alcohol: ? Do not drink on an empty stomach. ? Limit how much you use to:  0-1 drink a day for women.  0-2 drinks a day for men. ? Be aware of how much alcohol is in your drink. In the [place] one drink equals one 12 oz bottle of beer (355 mL), one 5 oz glass of wine (148 mL), or one 1 oz glass of hard liquor (44 mL). ? Keep yourself hydrated with water, diet soda, or unsweetened iced tea.  Keep in mind that regular soda, juice, and other mixers may contain a lot of sugar and must be counted as carbs. What are tips for following this plan? Reading food labels  Start by checking the serving size on the "Nutrition Facts" label of packaged foods and drinks. The amount of calories, carbs, fats, and other nutrients listed on the label is based on one serving of the item. Many items contain more than one serving per package.  Check the total grams (g) of carbs in one serving. You can calculate the number of servings of carbs in one serving by dividing the total carbs by 15. For example, if a food has 30 g of total carbs per serving, it would be equal to 2 servings of carbs.  Check the number of grams (g) of saturated fats and trans fats in one serving. Choose foods that have   a low amount or none of these fats.  Check the number of milligrams (mg) of salt (sodium) in one serving. Most people should limit total sodium intake to less than 2,300 mg per day.  Always check the nutrition information of foods labeled as "low-fat" or "nonfat." These foods may be higher in added sugar or refined carbs and should be avoided.  Talk to your dietitian to identify your daily goals for nutrients listed on the label. Shopping  Avoid buying canned, pre-made, or processed foods. These foods tend to be high in fat, sodium, and added  sugar.  Shop around the outside edge of the grocery store. This is where you will most often find fresh fruits and vegetables, bulk grains, fresh meats, and fresh dairy. Cooking  Use low-heat cooking methods, such as baking, instead of high-heat cooking methods like deep frying.  Cook using healthy oils, such as olive, canola, or sunflower oil.  Avoid cooking with butter, cream, or high-fat meats. Meal planning  Eat meals and snacks regularly, preferably at the same times every day. Avoid going long periods of time without eating.  Eat foods that are high in fiber, such as fresh fruits, vegetables, beans, and whole grains. Talk with your dietitian about how many servings of carbs you can eat at each meal.  Eat 4-6 oz (112-168 g) of lean protein each day, such as lean meat, chicken, fish, eggs, or tofu. One ounce (oz) of lean protein is equal to: ? 1 oz (28 g) of meat, chicken, or fish. ? 1 egg. ?  cup (62 g) of tofu.  Eat some foods each day that contain healthy fats, such as avocado, nuts, seeds, and fish.   What foods should I eat? Fruits Berries. Apples. Oranges. Peaches. Apricots. Plums. Grapes. Mango. Papaya. Pomegranate. Kiwi. Cherries. Vegetables Lettuce. Spinach. Leafy greens, including kale, chard, collard greens, and mustard greens. Beets. Cauliflower. Cabbage. Broccoli. Carrots. Green beans. Tomatoes. Peppers. Onions. Cucumbers. Brussels sprouts. Grains Whole grains, such as whole-wheat or whole-grain bread, crackers, tortillas, cereal, and pasta. Unsweetened oatmeal. Quinoa. Brown or wild rice. Meats and other proteins Seafood. Poultry without skin. Lean cuts of poultry and beef. Tofu. Nuts. Seeds. Dairy Low-fat or fat-free dairy products such as milk, yogurt, and cheese. The items listed above may not be a complete list of foods and beverages you can eat. Contact a dietitian for more information. What foods should I avoid? Fruits Fruits canned with  syrup. Vegetables Canned vegetables. Frozen vegetables with butter or cream sauce. Grains Refined white flour and flour products such as bread, pasta, snack foods, and cereals. Avoid all processed foods. Meats and other proteins Fatty cuts of meat. Poultry with skin. Breaded or fried meats. Processed meat. Avoid saturated fats. Dairy Full-fat yogurt, cheese, or milk. Beverages Sweetened drinks, such as soda or iced tea. The items listed above may not be a complete list of foods and beverages you should avoid. Contact a dietitian for more information. Questions to ask a health care provider  Do I need to meet with a diabetes educator?  Do I need to meet with a dietitian?  What number can I call if I have questions?  When are the best times to check my blood glucose? Where to find more information:  American Diabetes Association: diabetes.org  Academy of Nutrition and Dietetics: www.eatright.org  National Institute of Diabetes and Digestive and Kidney Diseases: www.niddk.nih.gov  Association of Diabetes Care and Education Specialists: www.diabeteseducator.org Summary  It is important to have healthy eating   habits because your blood sugar (glucose) levels are greatly affected by what you eat and drink.  A healthy meal plan will help you control your blood glucose and maintain a healthy lifestyle.  Your health care provider may recommend that you work with a dietitian to make a meal plan that is best for you.  Keep in mind that carbohydrates (carbs) and alcohol have immediate effects on your blood glucose levels. It is important to count carbs and to use alcohol carefully. This information is not intended to replace advice given to you by your health care provider. Make sure you discuss any questions you have with your health care provider. Document Revised: 07/08/2019 Document Reviewed: 07/08/2019 Elsevier Patient Education  2021 Elsevier Inc.  

## 2021-03-01 ENCOUNTER — Encounter: Payer: Self-pay | Admitting: Nurse Practitioner

## 2021-03-01 ENCOUNTER — Ambulatory Visit (INDEPENDENT_AMBULATORY_CARE_PROVIDER_SITE_OTHER): Payer: Medicaid Other | Admitting: Nurse Practitioner

## 2021-03-01 ENCOUNTER — Other Ambulatory Visit: Payer: Self-pay

## 2021-03-01 VITALS — BP 117/74 | HR 97

## 2021-03-01 DIAGNOSIS — I10 Essential (primary) hypertension: Secondary | ICD-10-CM | POA: Diagnosis not present

## 2021-03-01 DIAGNOSIS — E118 Type 2 diabetes mellitus with unspecified complications: Secondary | ICD-10-CM | POA: Diagnosis not present

## 2021-03-01 DIAGNOSIS — E1165 Type 2 diabetes mellitus with hyperglycemia: Secondary | ICD-10-CM | POA: Diagnosis not present

## 2021-03-01 DIAGNOSIS — E782 Mixed hyperlipidemia: Secondary | ICD-10-CM

## 2021-03-01 DIAGNOSIS — IMO0002 Reserved for concepts with insufficient information to code with codable children: Secondary | ICD-10-CM

## 2021-03-01 LAB — POCT GLYCOSYLATED HEMOGLOBIN (HGB A1C): HbA1c, POC (controlled diabetic range): 10.2 % — AB (ref 0.0–7.0)

## 2021-03-01 MED ORDER — INSULIN GLARGINE 100 UNIT/ML ~~LOC~~ SOLN: 70.0000 [IU] | Freq: Every day | SUBCUTANEOUS | 0 refills | Status: DC
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
## 2021-03-15 NOTE — Patient Instructions (Signed)

## 2021-03-16 ENCOUNTER — Encounter: Payer: Self-pay | Admitting: Nurse Practitioner

## 2021-03-16 ENCOUNTER — Other Ambulatory Visit: Payer: Self-pay

## 2021-03-16 ENCOUNTER — Ambulatory Visit (INDEPENDENT_AMBULATORY_CARE_PROVIDER_SITE_OTHER): Payer: Medicaid Other | Admitting: Nurse Practitioner

## 2021-03-16 VITALS — BP 121/79 | HR 89

## 2021-03-16 DIAGNOSIS — E782 Mixed hyperlipidemia: Secondary | ICD-10-CM

## 2021-03-16 DIAGNOSIS — Z794 Long term (current) use of insulin: Secondary | ICD-10-CM | POA: Diagnosis not present

## 2021-03-16 DIAGNOSIS — E1165 Type 2 diabetes mellitus with hyperglycemia: Secondary | ICD-10-CM | POA: Diagnosis not present

## 2021-03-16 DIAGNOSIS — I10 Essential (primary) hypertension: Secondary | ICD-10-CM | POA: Diagnosis not present

## 2021-03-16 DIAGNOSIS — IMO0002 Reserved for concepts with insufficient information to code with codable children: Secondary | ICD-10-CM

## 2021-03-16 DIAGNOSIS — E559 Vitamin D deficiency, unspecified: Secondary | ICD-10-CM

## 2021-03-16 MED ORDER — INSULIN GLARGINE 100 UNIT/ML ~~LOC~~ SOLN: 80.0000 [IU] | Freq: Every day | SUBCUTANEOUS | 0 refills | Status: DC

## 2021-03-16 MED ORDER — ACCU-CHEK GUIDE VI STRP: ORAL_STRIP | 12 refills | Status: DC
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
## 2021-03-23 ENCOUNTER — Encounter: Payer: Medicaid Other | Attending: Internal Medicine | Admitting: Nutrition

## 2021-03-23 ENCOUNTER — Other Ambulatory Visit: Payer: Self-pay

## 2021-03-23 DIAGNOSIS — IMO0002 Reserved for concepts with insufficient information to code with codable children: Secondary | ICD-10-CM

## 2021-03-23 DIAGNOSIS — I10 Essential (primary) hypertension: Secondary | ICD-10-CM | POA: Insufficient documentation

## 2021-03-23 DIAGNOSIS — Z89511 Acquired absence of right leg below knee: Secondary | ICD-10-CM | POA: Diagnosis present

## 2021-03-23 DIAGNOSIS — E1165 Type 2 diabetes mellitus with hyperglycemia: Secondary | ICD-10-CM | POA: Diagnosis present

## 2021-03-23 DIAGNOSIS — E782 Mixed hyperlipidemia: Secondary | ICD-10-CM | POA: Diagnosis present

## 2021-03-23 DIAGNOSIS — E1142 Type 2 diabetes mellitus with diabetic polyneuropathy: Secondary | ICD-10-CM | POA: Diagnosis present

## 2021-03-23 DIAGNOSIS — E118 Type 2 diabetes mellitus with unspecified complications: Secondary | ICD-10-CM | POA: Insufficient documentation

## 2021-03-23 NOTE — Progress Notes (Signed)
Medical Nutrition Therapy  Appointment Start time:  1030  Appointment End time:  1130  Primary concerns today: DM Type 2  Referral diagnosis: E11.8 Preferred learning style: no preference Learning readiness: ready   NUTRITION ASSESSMENT  A1C 10.2%. Didn't know he had Dm til he had an amputation. Use to eat randomly, whenever he felt like it. Working on eating better now and doesn't want any further complications from DM. Just started Ronny Bacon, FNP at [facility]. PCP Dr. Mayford Knife. Anthropometrics  Wt Readings from Last 3 Encounters:  08/17/20 159 lb (72.1 kg)  07/01/20 140 lb (63.5 kg)  06/11/20 158 lb 8.2 oz (71.9 kg)   Ht Readings from Last 3 Encounters:  07/01/20 5\' 5"  (1.651 m)  06/11/20 5\' 6"  (1.676 m)  06/08/20 5\' 5"  (1.651 m)   There is no height or weight on file to calculate BMI. @BMIFA @ Facility age limit for growth percentiles is 20 years. Facility age limit for growth percentiles is 20 years. CMP Latest Ref Rng & Units 11/19/2020 07/19/2020 06/23/2020  Glucose 70 - 99 mg/dL ) 01/19/2021) 90  BUN 6 - 20 mg/dL 15 20 17   Creatinine 0.61 - 1.24 mg/dL 14/01/2020 13/05/2020 081(K)  Sodium 135 - 145 mmol/L 139 138 141  Potassium 3.5 - 5.1 mmol/L 4.4 3.9 4.1  Chloride 98 - 111 mmol/L 104 103 105  CO2 22 - 32 mmol/L 26 26 25   Calcium 8.9 - 10.3 mg/dL 9.6 9.3 9.2  Total Protein 6.5 - 8.1 g/dL 7.4 7.8 -  Total Bilirubin 0.3 - 1.2 mg/dL 0.6 0.4 -  Alkaline Phos 38 - 126 U/L 81 70 -  AST 15 - 41 U/L 22 12(L) -  ALT 0 - 44 U/L 41 17 -   Lipid Panel     Component Value Date/Time   CHOL 135 11/19/2020 0851   TRIG 184 (H) 11/19/2020 0851   HDL 33 (L) 11/19/2020 0851   CHOLHDL 4.1 11/19/2020 0851   VLDL 37 11/19/2020 0851   LDLCALC 65 11/19/2020 0851   Lab Results  Component Value Date   HGBA1C 10.2 (A) 03/01/2021      Clinical Medical Hx: Rt BKA.  Medications: Lantus 80 units, Metformin 1000 mg BID,  Labs:  Lab Results  Component Value Date   HGBA1C  10.2 (A) 03/01/2021   CMP Latest Ref Rng & Units 11/19/2020 07/19/2020 06/23/2020  Glucose 70 - 99 mg/dL 01/19/2021) 03/03/2021) 90  BUN 6 - 20 mg/dL 15 20 17   Creatinine 0.61 - 1.24 mg/dL 03/03/2021 01/19/2021 14/01/2020)  Sodium 135 - 145 mmol/L 139 138 141  Potassium 3.5 - 5.1 mmol/L 4.4 3.9 4.1  Chloride 98 - 111 mmol/L 104 103 105  CO2 22 - 32 mmol/L 26 26 25   Calcium 8.9 - 10.3 mg/dL 9.6 9.3 9.2  Total Protein 6.5 - 8.1 g/dL 7.4 7.8 -  Total Bilirubin 0.3 - 1.2 mg/dL 0.6 0.4 -  Alkaline Phos 38 - 126 U/L 81 70 -  AST 15 - 41 U/L 22 12(L) -  ALT 0 - 44 U/L 41 17 -   Lipid Panel     Component Value Date/Time   CHOL 135 11/19/2020 0851   TRIG 184 (H) 11/19/2020 0851   HDL 33 (L) 11/19/2020 0851   CHOLHDL 4.1 11/19/2020 0851   VLDL 37 11/19/2020 0851   LDLCALC 65 11/19/2020 0851    Notable Signs/Symptoms: Increased thirst, frequent urination, fatigue  Lifestyle & Dietary Hx Didn't know he had DM. Wants to improve  it by eating better. Lives with his mother. She does most of the cooking for him and do a lot for him. He notes he doesn't know how to cook.  Estimated daily fluid intake: 32 oz Supplements:  Sleep: varies Stress / self-care: health Current average weekly physical activity: ADL  24-Hr Dietary Recall First Meal: Honey nut cherrios, boiled egg, brisk tea sweetened Snack: chips Second Meal: bologna sandwich, chips, zero gatorade or brisk tea Snack:  Third Meal: Fried chicken, corn, zero gatorade Snack:  Beverages: brisk tea, zero gatorade, water  Estimated Energy Needs Calories: 1800-2000 Carbohydrate: 235g Protein: 150g Fat: 56 g   NUTRITION DIAGNOSIS  NB-1.1 Food and nutrition-related knowledge deficit As related to Diabetes Type 2.  As evidenced by A1C 10.2%.   NUTRITION INTERVENTION  Nutrition education (E-1) on the following topics:  Nutrition and Diabetes education provided on My Plate, CHO counting, meal planning, portion sizes, timing of meals, avoiding snacks between  meals unless having a low blood sugar, target ranges for A1C and blood sugars, signs/symptoms and treatment of hyper/hypoglycemia, monitoring blood sugars, taking medications as prescribed, benefits of exercising 30 minutes per day and prevention of complications of DM.   Handouts Provided Include  MY Plate Meal Plan Card Diabetes Instructions.   Learning Style & Readiness for Change Teaching method utilized: Visual & Auditory  Demonstrated degree of understanding via: Teach Back  Barriers to learning/adherence to lifestyle change: BKA  Goals Established by Pt Follow My Plate Eat meals on time Do not skip meals Drink only water-100 oz per day Test blood sugars 4 times per day Take Medications as prescribed. Cut out processed foods and focus on more plant based foods that grow in a garden.   MONITORING & EVALUATION Dietary intake, weekly physical activity, and blood sugars  in 1 month.  Next Steps  Patient is to work on better quality of foods and eating meals on time.

## 2021-04-04 DIAGNOSIS — Z1211 Encounter for screening for malignant neoplasm of colon: Secondary | ICD-10-CM | POA: Insufficient documentation

## 2021-04-06 ENCOUNTER — Encounter: Payer: Self-pay | Admitting: Nutrition

## 2021-04-06 NOTE — Patient Instructions (Addendum)
Goals Established by Pt Follow My Plate Eat meals on time Do not skip meals Drink only water-100 oz per day Test blood sugars 4 times per day Take Medications as prescribed. Cut out processed foods and focus on more plant based foods that grow in a garden.

## 2021-04-20 ENCOUNTER — Other Ambulatory Visit: Payer: Self-pay

## 2021-04-20 ENCOUNTER — Other Ambulatory Visit: Payer: Self-pay | Admitting: Nurse Practitioner

## 2021-04-20 ENCOUNTER — Encounter: Payer: Medicaid Other | Attending: Internal Medicine | Admitting: Nutrition

## 2021-04-20 DIAGNOSIS — I10 Essential (primary) hypertension: Secondary | ICD-10-CM | POA: Insufficient documentation

## 2021-04-20 DIAGNOSIS — Z89511 Acquired absence of right leg below knee: Secondary | ICD-10-CM | POA: Insufficient documentation

## 2021-04-20 DIAGNOSIS — E782 Mixed hyperlipidemia: Secondary | ICD-10-CM | POA: Insufficient documentation

## 2021-04-20 DIAGNOSIS — IMO0002 Reserved for concepts with insufficient information to code with codable children: Secondary | ICD-10-CM

## 2021-04-20 DIAGNOSIS — E1165 Type 2 diabetes mellitus with hyperglycemia: Secondary | ICD-10-CM | POA: Insufficient documentation

## 2021-04-20 DIAGNOSIS — E1142 Type 2 diabetes mellitus with diabetic polyneuropathy: Secondary | ICD-10-CM | POA: Insufficient documentation

## 2021-04-20 DIAGNOSIS — E118 Type 2 diabetes mellitus with unspecified complications: Secondary | ICD-10-CM | POA: Insufficient documentation

## 2021-04-20 MED ORDER — INSULIN GLARGINE 100 UNIT/ML ~~LOC~~ SOLN: 20.0000 [IU] | Freq: Every day | SUBCUTANEOUS | 0 refills | Status: DC

## 2021-04-20 NOTE — Progress Notes (Signed)
Medical Nutrition Therapy  Appointment Start time:  0930  Appointment End time:  1000  Primary concerns today: DM Type 2  Referral diagnosis: E11.8 Preferred learning style: no preference Learning readiness: ready   NUTRITION ASSESSMENT  Doing really well.  Eating better, taking medications and checking blood sugars. Feels a lot better.    A1C 10.2%. Didn't know he had Dm til he had an amputation. Use to eat randomly, whenever he felt like it. Working on eating better now and doesn't want any further complications from DM. Just started  seeing Ronny Bacon, FNP at [facility]. PCP Dr. Mayford Knife. Has been only taking 20 units of insulin lately because he thought he was going to run out of his insulin pens. FBS have been 100-130's.  Got his leg prosthesis and is using it. Goes back to ortho tomorrow. He is liking it and getting up moving more.  Anthropometrics  Wt Readings from Last 3 Encounters:  08/17/20 159 lb (72.1 kg)  07/01/20 140 lb (63.5 kg)  06/11/20 158 lb 8.2 oz (71.9 kg)   Ht Readings from Last 3 Encounters:  07/01/20 5\' 5"  (1.651 m)  06/11/20 5\' 6"  (1.676 m)  06/08/20 5\' 5"  (1.651 m)   There is no height or weight on file to calculate BMI. @BMIFA @ Facility age limit for growth percentiles is 20 years. Facility age limit for growth percentiles is 20 years. CMP Latest Ref Rng & Units 11/19/2020 07/19/2020 06/23/2020  Glucose 70 - 99 mg/dL ) 01/19/2021) 90  BUN 6 - 20 mg/dL 15 20 17   Creatinine 0.61 - 1.24 mg/dL 14/01/2020 13/05/2020 812(X)  Sodium 135 - 145 mmol/L 139 138 141  Potassium 3.5 - 5.1 mmol/L 4.4 3.9 4.1  Chloride 98 - 111 mmol/L 104 103 105  CO2 22 - 32 mmol/L 26 26 25   Calcium 8.9 - 10.3 mg/dL 9.6 9.3 9.2  Total Protein 6.5 - 8.1 g/dL 7.4 7.8 -  Total Bilirubin 0.3 - 1.2 mg/dL 0.6 0.4 -  Alkaline Phos 38 - 126 U/L 81 70 -  AST 15 - 41 U/L 22 12(L) -  ALT 0 - 44 U/L 41 17 -   Lipid Panel     Component Value Date/Time   CHOL 135 11/19/2020  0851   TRIG 184 (H) 11/19/2020 0851   HDL 33 (L) 11/19/2020 0851   CHOLHDL 4.1 11/19/2020 0851   VLDL 37 11/19/2020 0851   LDLCALC 65 11/19/2020 0851   Lab Results  Component Value Date   HGBA1C 10.2 (A) 03/01/2021      Clinical Medical Hx: Rt BKA.  Medications: Lantus 80 units, Metformin 1000 mg BID,  Labs:  Lab Results  Component Value Date   HGBA1C 10.2 (A) 03/01/2021   CMP Latest Ref Rng & Units 11/19/2020 07/19/2020 06/23/2020  Glucose 70 - 99 mg/dL 01/19/2021) 03/03/2021) 90  BUN 6 - 20 mg/dL 15 20 17   Creatinine 0.61 - 1.24 mg/dL 03/03/2021 01/19/2021 14/01/2020)  Sodium 135 - 145 mmol/L 139 138 141  Potassium 3.5 - 5.1 mmol/L 4.4 3.9 4.1  Chloride 98 - 111 mmol/L 104 103 105  CO2 22 - 32 mmol/L 26 26 25   Calcium 8.9 - 10.3 mg/dL 9.6 9.3 9.2  Total Protein 6.5 - 8.1 g/dL 7.4 7.8 -  Total Bilirubin 0.3 - 1.2 mg/dL 0.6 0.4 -  Alkaline Phos 38 - 126 U/L 81 70 -  AST 15 - 41 U/L 22 12(L) -  ALT 0 - 44 U/L 41 17 -  Lipid Panel     Component Value Date/Time   CHOL 135 11/19/2020 0851   TRIG 184 (H) 11/19/2020 0851   HDL 33 (L) 11/19/2020 0851   CHOLHDL 4.1 11/19/2020 0851   VLDL 37 11/19/2020 0851   LDLCALC 65 11/19/2020 0851    Notable Signs/Symptoms: Increased thirst, frequent urination, fatigue  Lifestyle & Dietary Hx Didn't know he had DM. Wants to improve it by eating better. Lives with his mother. She does most of the cooking for him and do a lot for him. He notes he doesn't know how to cook.  Estimated daily fluid intake: 32 oz Supplements:  Sleep: varies Stress / self-care: health Current average weekly physical activity: ADL  24-Hr Dietary Recall  B) 2 eggs, honey nut cherrios, milk, and water L)  MOW, crystal light packet Snack mandarin oranges. D)  Neck bones, peas, Snack:   Estimated Energy Needs Calories: 1800-2000 Carbohydrate: 235g Protein: 150g Fat: 56 g   NUTRITION DIAGNOSIS  NB-1.1 Food and nutrition-related knowledge deficit As related to Diabetes  Type 2.  As evidenced by A1C 10.2%.   NUTRITION INTERVENTION  Nutrition education (E-1) on the following topics:  Nutrition and Diabetes education provided on My Plate, CHO counting, meal planning, portion sizes, timing of meals, avoiding snacks between meals unless having a low blood sugar, target ranges for A1C and blood sugars, signs/symptoms and treatment of hyper/hypoglycemia, monitoring blood sugars, taking medications as prescribed, benefits of exercising 30 minutes per day and prevention of complications of DM.   Handouts Provided Include  MY Plate Meal Plan Card Diabetes Instructions.   Learning Style & Readiness for Change Teaching method utilized: Visual & Auditory  Demonstrated degree of understanding via: Teach Back  Barriers to learning/adherence to lifestyle change: BKA  Goals Established by Pt Goals  Keep up the good job! Focus on more plant based foods- fruits, vegetables and whole grains. Keep drinking 5-6 bottles of  water per day Work on walking in the house. We will call you to discuss about your insulin amounts.  MONITORING & EVALUATION Dietary intake, weekly physical activity, and blood sugars  in 1 month.  Next Steps  Patient is to work on better quality of foods and eating meals on time.

## 2021-04-20 NOTE — Progress Notes (Signed)
Per Norm Salt, RDE, patient has only been injecting 20 units of Lantus nightly since he was switched from insulin vials to insulin pens.  His glucose readings have improved significantly and he is advised to continue his current dose of Lantus at 20 units SQ nightly.  He has also not been taking his Glipizide and is near target both fasting and postprandially speaking, therefore he can stay off of it for now.  Since last visit, he has gotten his prosthesis for his BKA and is more active with PT, likely contributing to his improved glucose readings.

## 2021-04-20 NOTE — Patient Instructions (Signed)
Goals  Keep up the good job! Focus on more plant based foods- fruits, vegetables and whole grains. Keep drinking 5-6 bottles of  water per day Work on walking in the house. We will call you to discuss about your insulin amounts.

## 2021-05-10 ENCOUNTER — Encounter: Payer: Self-pay | Admitting: Nutrition

## 2021-05-26 HISTORY — PX: COLONOSCOPY: SHX174

## 2021-05-31 ENCOUNTER — Telehealth: Payer: Self-pay

## 2021-05-31 DIAGNOSIS — Z794 Long term (current) use of insulin: Secondary | ICD-10-CM

## 2021-05-31 DIAGNOSIS — E1165 Type 2 diabetes mellitus with hyperglycemia: Secondary | ICD-10-CM

## 2021-05-31 NOTE — Telephone Encounter (Signed)
Done, referral entered

## 2021-05-31 NOTE — Telephone Encounter (Signed)
Patient is requesting a referral to a foot dr.

## 2021-06-06 ENCOUNTER — Encounter: Payer: Self-pay | Admitting: Podiatry

## 2021-06-06 ENCOUNTER — Other Ambulatory Visit: Payer: Self-pay

## 2021-06-06 ENCOUNTER — Ambulatory Visit (INDEPENDENT_AMBULATORY_CARE_PROVIDER_SITE_OTHER): Payer: Medicaid Other | Admitting: Podiatry

## 2021-06-06 DIAGNOSIS — B353 Tinea pedis: Secondary | ICD-10-CM

## 2021-06-06 DIAGNOSIS — E1142 Type 2 diabetes mellitus with diabetic polyneuropathy: Secondary | ICD-10-CM | POA: Diagnosis not present

## 2021-06-06 DIAGNOSIS — M2142 Flat foot [pes planus] (acquired), left foot: Secondary | ICD-10-CM | POA: Diagnosis not present

## 2021-06-06 DIAGNOSIS — B351 Tinea unguium: Secondary | ICD-10-CM | POA: Diagnosis not present

## 2021-06-06 DIAGNOSIS — S88119A Complete traumatic amputation at level between knee and ankle, unspecified lower leg, initial encounter: Secondary | ICD-10-CM | POA: Diagnosis not present

## 2021-06-06 DIAGNOSIS — E119 Type 2 diabetes mellitus without complications: Secondary | ICD-10-CM

## 2021-06-06 MED ORDER — KETOCONAZOLE 2 % EX CREA: TOPICAL_CREAM | CUTANEOUS | 1 refills | Status: DC

## 2021-06-06 NOTE — Patient Instructions (Signed)
Apply triple antibiotic cream to left great toe once daily for one week.   Diabetes Mellitus and Foot Care Foot care is an important part of your health, especially when you have diabetes. Diabetes may cause you to have problems because of poor blood flow (circulation) to your feet and legs, which can cause your skin to: Become thinner and drier. Break more easily. Heal more slowly. Peel and crack. You may also have nerve damage (neuropathy) in your legs and feet, causing decreased feeling in them. This means that you may not notice minor injuries to your feet that could lead to more serious problems. Noticing and addressing any potential problems early is the best way to prevent future foot problems. How to care for your feet Foot hygiene  Wash your feet daily with warm water and mild soap. Do not use hot water. Then, pat your feet and the areas between your toes until they are completely dry. Do not soak your feet as this can dry your skin. Trim your toenails straight across. Do not dig under them or around the cuticle. File the edges of your nails with an emery board or nail file. Apply a moisturizing lotion or petroleum jelly to the skin on your feet and to dry, brittle toenails. Use lotion that does not contain alcohol and is unscented. Do not apply lotion between your toes. Shoes and socks Wear clean socks or stockings every day. Make sure they are not too tight. Do not wear knee-high stockings since they may decrease blood flow to your legs. Wear shoes that fit properly and have enough cushioning. Always look in your shoes before you put them on to be sure there are no objects inside. To break in new shoes, wear them for just a few hours a day. This prevents injuries on your feet. Wounds, scrapes, corns, and calluses  Check your feet daily for blisters, cuts, bruises, sores, and redness. If you cannot see the bottom of your feet, use a mirror or ask someone for help. Do not cut corns or  calluses or try to remove them with medicine. If you find a minor scrape, cut, or break in the skin on your feet, keep it and the skin around it clean and dry. You may clean these areas with mild soap and water. Do not clean the area with peroxide, alcohol, or iodine. If you have a wound, scrape, corn, or callus on your foot, look at it several times a day to make sure it is healing and not infected. Check for: Redness, swelling, or pain. Fluid or blood. Warmth. Pus or a bad smell. General tips Do not cross your legs. This may decrease blood flow to your feet. Do not use heating pads or hot water bottles on your feet. They may burn your skin. If you have lost feeling in your feet or legs, you may not know this is happening until it is too late. Protect your feet from hot and cold by wearing shoes, such as at the beach or on hot pavement. Schedule a complete foot exam at least once a year (annually) or more often if you have foot problems. Report any cuts, sores, or bruises to your health care provider immediately. Where to find more information American Diabetes Association: www.diabetes.org Association of Diabetes Care & Education Specialists: www.diabeteseducator.org Contact a health care provider if: You have a medical condition that increases your risk of infection and you have any cuts, sores, or bruises on your feet. You have  an injury that is not healing. You have redness on your legs or feet. You feel burning or tingling in your legs or feet. You have pain or cramps in your legs and feet. Your legs or feet are numb. Your feet always feel cold. You have pain around any toenails. Get help right away if: You have a wound, scrape, corn, or callus on your foot and: You have pain, swelling, or redness that gets worse. You have fluid or blood coming from the wound, scrape, corn, or callus. Your wound, scrape, corn, or callus feels warm to the touch. You have pus or a bad smell coming  from the wound, scrape, corn, or callus. You have a fever. You have a red line going up your leg. Summary Check your feet every day for blisters, cuts, bruises, sores, and redness. Apply a moisturizing lotion or petroleum jelly to the skin on your feet and to dry, brittle toenails. Wear shoes that fit properly and have enough cushioning. If you have foot problems, report any cuts, sores, or bruises to your health care provider immediately. Schedule a complete foot exam at least once a year (annually) or more often if you have foot problems. This information is not intended to replace advice given to you by your health care provider. Make sure you discuss any questions you have with your health care provider. Document Revised: 02/19/2020 Document Reviewed: 02/19/2020 Elsevier Patient Education  2022 Elsevier Inc.  Athlete's Foot Athlete's foot (tinea pedis) is a fungal infection of the skin on your feet. It often occurs on the skin that is between or underneath the toes. It can also occur on the soles of your feet. The infection can spread from person to person (is contagious). It can also spread when a person's bare feet come in contact with the fungus on shower floors or on items such as shoes. What are the causes? This condition is caused by a fungus that grows in warm, moist places. You can get athlete's foot by sharing shoes, shower stalls, towels, and wet floors with someone who is infected. Not washing your feet or changing your socks often enough can also lead to athlete's foot. What increases the risk? This condition is more likely to develop in: Men. People who have a weak body defense system (immune system). People who have diabetes. People who use public showers, such as at a gym. People who wear heavy-duty shoes, such as Youth worker. Seasons with warm, humid weather. What are the signs or symptoms? Symptoms of this condition include: Itchy areas between your  toes or on the soles of your feet. White, flaky, or scaly areas between your toes or on the soles of your feet. Very itchy small blisters between your toes or on the soles of your feet. Small cuts in your skin. These cuts can become infected. Thick or discolored toenails. How is this diagnosed? This condition may be diagnosed with a physical exam and a review of your medical history. Your health care provider may also take a skin or toenail sample to examine under a microscope. How is this treated? This condition is treated with antifungal medicines. These may be applied as powders, ointments, or creams. In severe cases, an oral antifungal medicine may be given. Follow these instructions at home: Medicines Apply or take over-the-counter and prescription medicines only as told by your health care provider. Apply your antifungal medicine as told by your health care provider. Do not stop using the antifungal even  if your condition improves. Foot care Do not scratch your feet. Keep your feet dry: Wear cotton or wool socks. Change your socks every day or if they become wet. Wear shoes that allow air to flow, such as sandals or canvas tennis shoes. Wash and dry your feet, including the area between your toes. Also, wash and dry your feet: Every day or as told by your health care provider. After exercising. General instructions Do not let others use towels, shoes, nail clippers, or other personal items that touch your feet. Protect your feet by wearing sandals in wet areas, such as locker rooms and shared showers. Keep all follow-up visits as told by your health care provider. This is important. If you have diabetes, keep your blood sugar under control. Contact a health care provider if: You have a fever. You have swelling, soreness, warmth, or redness in your foot. Your feet are not getting better with treatment. Your symptoms get worse. You have new symptoms. Summary Athlete's foot (tinea  pedis) is a fungal infection of the skin on your feet. It often occurs on skin that is between or underneath the toes. This condition is caused by a fungus that grows in warm, moist places. Symptoms include white, flaky, or scaly areas between your toes or on the soles of your feet. This condition is treated with antifungal medicines. Keep your feet clean. Always dry them thoroughly. This information is not intended to replace advice given to you by your health care provider. Make sure you discuss any questions you have with your health care provider. Document Revised: 03/18/2020 Document Reviewed: 03/18/2020 Elsevier Patient Education  2022 [employer].
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
## 2021-06-21 ENCOUNTER — Ambulatory Visit: Payer: MEDICAID | Admitting: Nurse Practitioner

## 2021-06-21 LAB — HEMOGLOBIN A1C: Hemoglobin A1C: 8.3

## 2021-06-22 ENCOUNTER — Encounter: Payer: Self-pay | Admitting: Nurse Practitioner

## 2021-06-22 ENCOUNTER — Ambulatory Visit (INDEPENDENT_AMBULATORY_CARE_PROVIDER_SITE_OTHER): Payer: Medicaid Other | Admitting: Nurse Practitioner

## 2021-06-22 ENCOUNTER — Other Ambulatory Visit: Payer: Self-pay

## 2021-06-22 ENCOUNTER — Encounter: Payer: Self-pay | Admitting: Nutrition

## 2021-06-22 ENCOUNTER — Encounter: Payer: Medicaid Other | Attending: Nurse Practitioner | Admitting: Nutrition

## 2021-06-22 VITALS — BP 131/81 | HR 93 | Ht 66.0 in | Wt 189.6 lb

## 2021-06-22 VITALS — Ht 65.0 in | Wt 189.0 lb

## 2021-06-22 DIAGNOSIS — E1142 Type 2 diabetes mellitus with diabetic polyneuropathy: Secondary | ICD-10-CM | POA: Insufficient documentation

## 2021-06-22 DIAGNOSIS — Z89511 Acquired absence of right leg below knee: Secondary | ICD-10-CM | POA: Diagnosis present

## 2021-06-22 DIAGNOSIS — E782 Mixed hyperlipidemia: Secondary | ICD-10-CM | POA: Insufficient documentation

## 2021-06-22 DIAGNOSIS — I10 Essential (primary) hypertension: Secondary | ICD-10-CM | POA: Insufficient documentation

## 2021-06-22 DIAGNOSIS — E118 Type 2 diabetes mellitus with unspecified complications: Secondary | ICD-10-CM | POA: Diagnosis present

## 2021-06-22 DIAGNOSIS — E559 Vitamin D deficiency, unspecified: Secondary | ICD-10-CM

## 2021-06-22 DIAGNOSIS — E1165 Type 2 diabetes mellitus with hyperglycemia: Secondary | ICD-10-CM | POA: Diagnosis not present

## 2021-06-22 DIAGNOSIS — Z794 Long term (current) use of insulin: Secondary | ICD-10-CM

## 2021-06-22 MED ORDER — GLIPIZIDE ER 5 MG PO TB24
5.0000 mg | ORAL_TABLET | Freq: Every day | ORAL | 3 refills | Status: DC
Start: 2021-06-22 — End: 2022-03-27

## 2021-06-22 MED ORDER — ATORVASTATIN CALCIUM 20 MG PO TABS: 20.0000 mg | ORAL_TABLET | Freq: Every day | ORAL | 3 refills | Status: AC
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
## 2021-06-22 NOTE — Patient Instructions (Signed)

## 2021-06-22 NOTE — Patient Instructions (Signed)
Goals Established by Pt Goals  Keep up the good job! Focus on more plant based foods- fruits, vegetables and whole grains. Keep drinking 5-6 bottles of  water per day Eat 2 slices bread with eggs for breakfast and a piece of fruit Keep drinking water   Lifestyle Medicine - Whole Food, Plant Predominant Nutrition is highly recommended: Eat Plenty of vegetables, Mushrooms, fruits, Legumes, Whole Grains, Nuts, seeds in lieu of processed meats, processed snacks/pastries red meat, poultry, eggs.    -It is better to avoid simple carbohydrates including: Cakes, Sweet Desserts, Ice Cream, Soda (diet and regular), Sweet Tea, Candies, Chips, Cookies, Store Bought Juices, Alcohol in Excess of  1-2 drinks a day, Lemonade,  Artificial Sweeteners, Doughnuts, Coffee Creamers, "Sugar-free" Products, etc, etc.  This is not a complete list.....  Exercise: If you are able: 30 -60 minutes a day ,4 days a week, or 150 minutes a week.  The longer the better.  Combine stretch, strength, and aerobic activities.  If you were told in the past that you have high risk for cardiovascular diseases, you may seek evaluation by your heart doctor prior to initiating moderate to intense exercise programs.

## 2021-06-22 NOTE — Progress Notes (Signed)
Medical Nutrition Therapy Follow up Appointment Start time:  1030  Appointment End time:  1100  Primary concerns today: DM Type 2  Referral diagnosis: E11.8 Preferred learning style: no preference Learning readiness: ready   NUTRITION ASSESSMENT   [place] with front roller walker. Walking much better. A1C 8.3% from Dr. Mayford Knife. Feelings better.  Eating earlier at night and not eating after 6 pm. Doing really well.  Eating better, taking medications and checking blood sugars. Feels a lot better.  Meter shows 7 day avg 159 mg/dl, 14 day 696 mg/dkl 30 day 174 mg/dl.    A1C 10.2%. Didn't know he had Dm til he had an amputation. Use to eat randomly, whenever he felt like it. Working on eating better now and doesn't want any further complications from DM. Just started  seeing Ronny Bacon, FNP at [facility]. PCP Dr. Mayford Knife. Has been only taking 20 units of insulin lately because he thought he was going to run out of his insulin pens. FBS have been 100-130's.  Got his leg prosthesis and is using it. Goes back to ortho tomorrow. He is liking it and getting up moving more.  Anthropometrics  Wt Readings from Last 3 Encounters:  06/22/21 189 lb 9.6 oz (86 kg)  08/17/20 159 lb (72.1 kg)  07/01/20 140 lb (63.5 kg)   Ht Readings from Last 3 Encounters:  06/22/21 5\' 6"  (1.676 m)  07/01/20 5\' 5"  (1.651 m)  06/11/20 5\' 6"  (1.676 m)   There is no height or weight on file to calculate BMI. @BMIFA @ Facility age limit for growth percentiles is 20 years. Facility age limit for growth percentiles is 20 years. CMP Latest Ref Rng & Units 11/19/2020 07/19/2020 06/23/2020  Glucose 70 - 99 mg/dL ) 01/19/2021) 90  BUN 6 - 20 mg/dL 15 20 17   Creatinine 0.61 - 1.24 mg/dL 14/01/2020 13/05/2020 789(F)  Sodium 135 - 145 mmol/L 139 138 141  Potassium 3.5 - 5.1 mmol/L 4.4 3.9 4.1  Chloride 98 - 111 mmol/L 104 103 105  CO2 22 - 32 mmol/L 26 26 25   Calcium 8.9 - 10.3 mg/dL 9.6 9.3 9.2  Total  Protein 6.5 - 8.1 g/dL 7.4 7.8 -  Total Bilirubin 0.3 - 1.2 mg/dL 0.6 0.4 -  Alkaline Phos 38 - 126 U/L 81 70 -  AST 15 - 41 U/L 22 12(L) -  ALT 0 - 44 U/L 41 17 -   Lipid Panel     Component Value Date/Time   CHOL 135 11/19/2020 0851   TRIG 184 (H) 11/19/2020 0851   HDL 33 (L) 11/19/2020 0851   CHOLHDL 4.1 11/19/2020 0851   VLDL 37 11/19/2020 0851   LDLCALC 65 11/19/2020 0851   Lab Results  Component Value Date   HGBA1C 10.2 (A) 03/01/2021      Clinical Medical Hx: Rt BKA.  Medications: Lantus 80 units, Metformin 1000 mg BID,  Labs:  Lab Results  Component Value Date   HGBA1C 10.2 (A) 03/01/2021   CMP Latest Ref Rng & Units 11/19/2020 07/19/2020 06/23/2020  Glucose 70 - 99 mg/dL 01/19/2021) 03/03/2021) 90  BUN 6 - 20 mg/dL 15 20 17   Creatinine 0.61 - 1.24 mg/dL 03/03/2021 01/19/2021 14/01/2020)  Sodium 135 - 145 mmol/L 139 138 141  Potassium 3.5 - 5.1 mmol/L 4.4 3.9 4.1  Chloride 98 - 111 mmol/L 104 103 105  CO2 22 - 32 mmol/L 26 26 25   Calcium 8.9 - 10.3 mg/dL 9.6 9.3 9.2  Total Protein 6.5 -  8.1 g/dL 7.4 7.8 -  Total Bilirubin 0.3 - 1.2 mg/dL 0.6 0.4 -  Alkaline Phos 38 - 126 U/L 81 70 -  AST 15 - 41 U/L 22 12(L) -  ALT 0 - 44 U/L 41 17 -   Lipid Panel     Component Value Date/Time   CHOL 135 11/19/2020 0851   TRIG 184 (H) 11/19/2020 0851   HDL 33 (L) 11/19/2020 0851   CHOLHDL 4.1 11/19/2020 0851   VLDL 37 11/19/2020 0851   LDLCALC 65 11/19/2020 0851    Notable Signs/Symptoms: Increased thirst, frequent urination, fatigue  Lifestyle & Dietary Hx Didn't know he had DM. Wants to improve it by eating better. Lives with his mother. She does most of the cooking for him and do a lot for him. He notes he doesn't know how to cook.  Estimated daily fluid intake: 32 oz Supplements:  Sleep: varies Stress / self-care: health Current average weekly physical activity: ADL  24-Hr Dietary Recall  B) Eggs and bacon, Ensure. L)  MOW, crystal light packet Snack mandarin oranges. D)  Steak  and potatoes, gatorade zero, or water Snack:   Estimated Energy Needs Calories: 1800-2000 Carbohydrate: 235g Protein: 150g Fat: 56 g   NUTRITION DIAGNOSIS  NB-1.1 Food and nutrition-related knowledge deficit As related to Diabetes Type 2.  As evidenced by A1C 10.2%.   NUTRITION INTERVENTION  Nutrition education (E-1) on the following topics:  Nutrition and Diabetes education provided on My Plate, CHO counting, meal planning, portion sizes, timing of meals, avoiding snacks between meals unless having a low blood sugar, target ranges for A1C and blood sugars, signs/symptoms and treatment of hyper/hypoglycemia, monitoring blood sugars, taking medications as prescribed, benefits of exercising 30 minutes per day and prevention of complications of DM.   Handouts Provided Include  MY Plate Meal Plan Card Diabetes Instructions.   Learning Style & Readiness for Change Teaching method utilized: Visual & Auditory  Demonstrated degree of understanding via: Teach Back  Barriers to learning/adherence to lifestyle change: BKA  Goals Established by Pt Goals  Keep up the good job! Focus on more plant based foods- fruits, vegetables and whole grains. Keep drinking 5-6 bottles of  water per day Eat 2 slices bread with eggs for breakfast and a piece of fruit Keep drinking water   Lifestyle Medicine - Whole Food, Plant Predominant Nutrition is highly recommended: Eat Plenty of vegetables, Mushrooms, fruits, Legumes, Whole Grains, Nuts, seeds in lieu of processed meats, processed snacks/pastries red meat, poultry, eggs.    -It is better to avoid simple carbohydrates including: Cakes, Sweet Desserts, Ice Cream, Soda (diet and regular), Sweet Tea, Candies, Chips, Cookies, Store Bought Juices, Alcohol in Excess of  1-2 drinks a day, Lemonade,  Artificial Sweeteners, Doughnuts, Coffee Creamers, "Sugar-free" Products, etc, etc.  This is not a complete list.....  Exercise: If you are able: 30 -60  minutes a day ,4 days a week, or 150 minutes a week.  The longer the better.  Combine stretch, strength, and aerobic activities.  If you were told in the past that you have high risk for cardiovascular diseases, you may seek evaluation by your heart doctor prior to initiating moderate to intense exercise programs.    MONITORING & EVALUATION Dietary intake, weekly physical activity, and blood sugars  in 3 month.  Next Steps  Patient is to work on better quality of foods and eating meals on time.

## 2021-06-27 DIAGNOSIS — K5790 Diverticulosis of intestine, part unspecified, without perforation or abscess without bleeding: Secondary | ICD-10-CM | POA: Insufficient documentation

## 2021-07-12 ENCOUNTER — Encounter: Payer: Self-pay | Admitting: Nutrition

## 2021-08-23 ENCOUNTER — Telehealth: Payer: Self-pay | Admitting: Nutrition

## 2021-08-23 NOTE — Telephone Encounter (Signed)
TC to patient to see how he did with lunch. He notes he ate some grilled chicken and some coleslaw from [facility] for lunch. BS before meal was 260's. He took 3 units of insulin according to his sliding scale. He notes his BS is now 340. Advised to drink about 8 oz of water and then eat dinner on time and take insulin per Whitney Reardon's recommendations. Noted that his insulin pump prescription has been put in. He verbalized understanding. Encouraged 45 g CHO at each meal with protein and low carb vegetables. He verbalized understanding.

## 2021-09-13 LAB — COMPREHENSIVE METABOLIC PANEL
ALT: 15 IU/L (ref 0–44)
AST: 13 IU/L (ref 0–40)
Albumin/Globulin Ratio: 1.4 (ref 1.2–2.2)
Albumin: 4.2 g/dL (ref 3.8–4.9)
Alkaline Phosphatase: 88 IU/L (ref 44–121)
BUN/Creatinine Ratio: 12 (ref 9–20)
BUN: 16 mg/dL (ref 6–24)
Bilirubin Total: 0.3 mg/dL (ref 0.0–1.2)
CO2: 25 mmol/L (ref 20–29)
Calcium: 9.8 mg/dL (ref 8.7–10.2)
Chloride: 102 mmol/L (ref 96–106)
Creatinine, Ser: 1.39 mg/dL — ABNORMAL HIGH (ref 0.76–1.27)
Globulin, Total: 2.9 g/dL (ref 1.5–4.5)
Glucose: 164 mg/dL — ABNORMAL HIGH (ref 70–99)
Potassium: 4.3 mmol/L (ref 3.5–5.2)
Sodium: 143 mmol/L (ref 134–144)
Total Protein: 7.1 g/dL (ref 6.0–8.5)
eGFR: 60 mL/min/{1.73_m2} (ref >59)

## 2021-09-13 LAB — LIPID PANEL
Chol/HDL Ratio: 4.6 ratio (ref 0.0–5.0)
Cholesterol, Total: 151 mg/dL (ref 100–199)
HDL: 33 mg/dL — ABNORMAL LOW (ref >39)
LDL Chol Calc (NIH): 86 mg/dL (ref 0–99)
Triglycerides: 184 mg/dL — ABNORMAL HIGH (ref 0–149)
VLDL Cholesterol Cal: 32 mg/dL (ref 5–40)

## 2021-09-13 LAB — VITAMIN D 25 HYDROXY (VIT D DEFICIENCY, FRACTURES): Vit D, 25-Hydroxy: 42.8 ng/mL (ref 30.0–100.0)

## 2021-09-14 ENCOUNTER — Other Ambulatory Visit: Payer: Self-pay

## 2021-09-14 ENCOUNTER — Ambulatory Visit (INDEPENDENT_AMBULATORY_CARE_PROVIDER_SITE_OTHER): Payer: Medicaid Other | Admitting: Podiatry

## 2021-09-14 DIAGNOSIS — B351 Tinea unguium: Secondary | ICD-10-CM

## 2021-09-14 DIAGNOSIS — S88119A Complete traumatic amputation at level between knee and ankle, unspecified lower leg, initial encounter: Secondary | ICD-10-CM

## 2021-09-14 DIAGNOSIS — E1142 Type 2 diabetes mellitus with diabetic polyneuropathy: Secondary | ICD-10-CM

## 2021-09-14 DIAGNOSIS — M79676 Pain in unspecified toe(s): Secondary | ICD-10-CM | POA: Diagnosis not present

## 2021-09-18 ENCOUNTER — Encounter: Payer: Self-pay | Admitting: Podiatry

## 2021-09-18 NOTE — Progress Notes (Signed)
°  Subjective:  Patient ID: Ricky [name], male    DOB: 1965/12/20,  MRN: [id number]  Ricky [name] presents to clinic today for at risk foot care. 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  New problem(s): None.   PCP is Donetta Potts, MD , and last visit was July, 2022.  No Known Allergies  Review of Systems: Negative except as noted in the HPI. Objective:   Constitutional Ricky [name] is a pleasant 56 y.o. African American male, WD, WN in NAD. AAO x 3.   Vascular Capillary refill time to remaining digits <3 seconds. Faintly palpable DP pulse(s) LLE. Faintly palpable PT pulse(s) left lower extremity. Pedal hair absent. No pain with calf compression LLE. Lower extremity skin temperature gradient WNL LLE. No ischemia or gangrene noted left lower extremity. No cyanosis or clubbing noted left lower extremity.  Neurologic Normal speech. Oriented to person, place, and time. Protective sensation intact with 10 gram monofilament left lower extremity. Vibratory sensation diminished left lower extremity.  Dermatologic Pedal skin with normal turgor, texture and tone left lower extremity. No open wounds left lower extremity. No interdigital macerations left lower extremity. Toenails 1-5 left elongated, discolored, dystrophic, thickened, and crumbly with subungual debris and tenderness to dorsal palpation. No hyperkeratotic nor porokeratotic lesions present on today's visit.  Orthopedic: Muscle strength 5/5 to all LE muscle groups of left lower extremity. Lower extremity amputation(s): below knee amputation right lower extremity. Pes planus deformity noted left lower extremity.   Radiographs: None  Last A1c:  Hemoglobin A1C 06/21/2021 03/01/2021 11/19/2020  HGBA1C 8.3 10.2(A) 10.6(H)  Some recent data might be hidden    Assessment:   1. Onychomycosis   2. Amputation  below knee (HCC)   3. Diabetic peripheral neuropathy (HCC)    Plan:  Patient was evaluated and treated and all questions answered. Consent given for treatment as described below: -Toenails 1-5 left debrided in length and girth without iatrogenic bleeding with sterile nail nipper and dremel.  -Patient/POA to call should there be question/concern in the interim.  Return in about 3 months (around 12/12/2021) for diabetic nail trim.  Freddie Breech, DPM

## 2021-09-21 NOTE — Patient Instructions (Signed)

## 2021-09-22 ENCOUNTER — Ambulatory Visit (INDEPENDENT_AMBULATORY_CARE_PROVIDER_SITE_OTHER): Payer: Medicaid Other | Admitting: Nurse Practitioner

## 2021-09-22 ENCOUNTER — Encounter: Payer: Self-pay | Admitting: Nurse Practitioner

## 2021-09-22 ENCOUNTER — Ambulatory Visit: Payer: Medicaid Other | Admitting: Nutrition

## 2021-09-22 ENCOUNTER — Other Ambulatory Visit: Payer: Self-pay

## 2021-09-22 VITALS — BP 146/84 | HR 83 | Ht 66.0 in | Wt 189.0 lb

## 2021-09-22 DIAGNOSIS — E1165 Type 2 diabetes mellitus with hyperglycemia: Secondary | ICD-10-CM | POA: Diagnosis not present

## 2021-09-22 DIAGNOSIS — Z794 Long term (current) use of insulin: Secondary | ICD-10-CM | POA: Diagnosis not present

## 2021-09-22 DIAGNOSIS — I10 Essential (primary) hypertension: Secondary | ICD-10-CM | POA: Diagnosis not present

## 2021-09-22 DIAGNOSIS — E559 Vitamin D deficiency, unspecified: Secondary | ICD-10-CM

## 2021-09-22 DIAGNOSIS — E782 Mixed hyperlipidemia: Secondary | ICD-10-CM

## 2021-09-22 LAB — POCT UA - MICROALBUMIN
Creatinine, POC: 200 mg/dL
Microalbumin Ur, POC: 150 mg/L

## 2021-09-22 LAB — POCT GLYCOSYLATED HEMOGLOBIN (HGB A1C): HbA1c POC (<> result, manual entry): 9.3 % (ref 4.0–5.6)

## 2021-09-22 MED ORDER — INSULIN GLARGINE 100 UNIT/ML ~~LOC~~ SOLN: 30.0000 [IU] | Freq: Every day | SUBCUTANEOUS | 3 refills | Status: DC
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
## 2021-10-12 ENCOUNTER — Encounter: Payer: Medicaid Other | Attending: Internal Medicine | Admitting: Nutrition

## 2021-10-12 ENCOUNTER — Encounter: Payer: Self-pay | Admitting: Nutrition

## 2021-10-12 DIAGNOSIS — I10 Essential (primary) hypertension: Secondary | ICD-10-CM

## 2021-10-12 DIAGNOSIS — Z89511 Acquired absence of right leg below knee: Secondary | ICD-10-CM

## 2021-10-12 DIAGNOSIS — E782 Mixed hyperlipidemia: Secondary | ICD-10-CM | POA: Diagnosis present

## 2021-10-12 DIAGNOSIS — E118 Type 2 diabetes mellitus with unspecified complications: Secondary | ICD-10-CM

## 2021-10-12 DIAGNOSIS — E1142 Type 2 diabetes mellitus with diabetic polyneuropathy: Secondary | ICD-10-CM

## 2021-10-12 DIAGNOSIS — E66812 Obesity, class 2: Secondary | ICD-10-CM

## 2021-10-12 NOTE — Progress Notes (Signed)
Medical Nutrition Therapy Follow up ?Appointment Start time:  1000 Appointment End time:  1030 ? ?Primary concerns today: DM Type 2 Follow up ?Referral diagnosis: E11.8 ?Preferred learning style: no preference ?Learning readiness: ready ? ? ?NUTRITION ASSESSMENT    ?A1C up to 9.3% from 8.3%. Elevated Creatine at 1.39, eGFR 60  ?Saw Rayetta Pigg 09/22/21. R BKA, has prosthesis and uses walker. ?TG elevated 184 mg/dl. HDL low 33. ?Lantus was increased to 35 units due to elevated blood sugar by Dr. Jimmye Norman yesterday and Glipizide was increased to 5 mg BID. FBS still elevated. ?Drinking more water now. ? ?FBS 210 this am. ? ?He notes he can make better food choices. Has been eating fast food from [employer] often. Drinking water but diet is low in fresh fruits, vegetables and whole grains. His mother cooks his meals for him. ? ? ? ?Anthropometrics  ?Wt Readings from Last 3 Encounters:  ?09/22/21 189 lb (85.7 kg)  ?06/22/21 189 lb (85.7 kg)  ?06/22/21 189 lb 9.6 oz (86 kg)  ? ?Ht Readings from Last 3 Encounters:  ?09/22/21 '5\' 6"'  (1.676 m)  ?06/22/21 '5\' 5"'  (1.651 m)  ?06/22/21 '5\' 6"'  (1.676 m)  ? ?There is no height or weight on file to calculate BMI. ?'@BMIFA' @ ?Facility age limit for growth percentiles is 20 years. ?Facility age limit for growth percentiles is 20 years. ?CMP Latest Ref Rng & Units 09/12/2021 11/19/2020 07/19/2020  ?Glucose 70 - 99 mg/dL 164(H) 235(H) 103(H)  ?BUN 6 - 24 mg/dL '16 15 20  ' ?Creatinine 0.76 - 1.27 mg/dL 1.39(H) 1.22 1.22  ?Sodium 134 - 144 mmol/L 143 139 138  ?Potassium 3.5 - 5.2 mmol/L 4.3 4.4 3.9  ?Chloride 96 - 106 mmol/L 102 104 103  ?CO2 20 - 29 mmol/L '25 26 26  ' ?Calcium 8.7 - 10.2 mg/dL 9.8 9.6 9.3  ?Total Protein 6.0 - 8.5 g/dL 7.1 7.4 7.8  ?Total Bilirubin 0.0 - 1.2 mg/dL 0.3 0.6 0.4  ?Alkaline Phos 44 - 121 IU/L 88 81 70  ?AST 0 - 40 IU/L 13 22 12(L)  ?ALT 0 - 44 IU/L 15 41 17  ? ?Lipid Panel  ?   ?Component Value Date/Time  ? CHOL 151 09/12/2021 0915  ? TRIG 184 (H) 09/12/2021 0915  ? HDL  33 (L) 09/12/2021 0915  ? CHOLHDL 4.6 09/12/2021 0915  ? CHOLHDL 4.1 11/19/2020 0851  ? VLDL 37 11/19/2020 0851  ? [place] 86 09/12/2021 0915  ? LABVLDL 32 09/12/2021 0915  ? ?Lab Results  ?Component Value Date  ? HGBA1C 9.3 09/22/2021  ? ?  ? ?Clinical ?Medical Hx: Rt BKA.  ?Medications: Lantus 80 units, Metformin 1000 mg BID,  ?Labs:  ?Lab Results  ?Component Value Date  ? HGBA1C 9.3 09/22/2021  ? ?CMP Latest Ref Rng & Units 09/12/2021 11/19/2020 07/19/2020  ?Glucose 70 - 99 mg/dL 164(H) 235(H) 103(H)  ?BUN 6 - 24 mg/dL '16 15 20  ' ?Creatinine 0.76 - 1.27 mg/dL 1.39(H) 1.22 1.22  ?Sodium 134 - 144 mmol/L 143 139 138  ?Potassium 3.5 - 5.2 mmol/L 4.3 4.4 3.9  ?Chloride 96 - 106 mmol/L 102 104 103  ?CO2 20 - 29 mmol/L '25 26 26  ' ?Calcium 8.7 - 10.2 mg/dL 9.8 9.6 9.3  ?Total Protein 6.0 - 8.5 g/dL 7.1 7.4 7.8  ?Total Bilirubin 0.0 - 1.2 mg/dL 0.3 0.6 0.4  ?Alkaline Phos 44 - 121 IU/L 88 81 70  ?AST 0 - 40 IU/L 13 22 12(L)  ?ALT 0 - 44 IU/L 15  41 17  ? ?Lipid Panel  ?   ?Component Value Date/Time  ? CHOL 151 09/12/2021 0915  ? TRIG 184 (H) 09/12/2021 0915  ? HDL 33 (L) 09/12/2021 0915  ? CHOLHDL 4.6 09/12/2021 0915  ? CHOLHDL 4.1 11/19/2020 0851  ? VLDL 37 11/19/2020 0851  ? [place] 86 09/12/2021 0915  ? LABVLDL 32 09/12/2021 0915  ? ? ?Notable Signs/Symptoms: Increased thirst, frequent urination, fatigue ? ?Lifestyle & Dietary Hx ?Didn't know he had DM. Wants to improve it by eating better. ?Lives with his mother. She does most of the cooking for him and do a lot for him. He notes he doesn't know how to cook. ? ?Estimated daily fluid intake: 32 oz ?Supplements:  ?Sleep: varies ?Stress / self-care: health ?Current average weekly physical activity: ADL ? ?24-Hr Dietary Recall  ?B) Bacon and 1 egg, water ?L) Fish sandwich at [employer], Water ?Dinner) Secondary school teacher sandwich, water. ?Can of mandarian oranges. ? ? ?Estimated Energy Needs ?Calories: 1800-2000 ?Carbohydrate: 235g ?Protein: 150g ?Fat: 56 g ? ? ?NUTRITION DIAGNOSIS   ?NB-1.1 Food and nutrition-related knowledge deficit As related to Diabetes Type 2.  As evidenced by A1C 10.2%. ? ? ?NUTRITION INTERVENTION  ?Nutrition education (E-1) on the following topics:  ?Nutrition and Diabetes education provided on My Plate, CHO counting, meal planning, portion sizes, timing of meals, avoiding snacks between meals unless having a low blood sugar, target ranges for A1C and blood sugars, signs/symptoms and treatment of hyper/hypoglycemia, monitoring blood sugars, taking medications as prescribed, benefits of exercising 30 minutes per day and prevention of complications of DM. ? ? ?Handouts Provided Include  ?MY Plate ?Meal Plan Card ?Diabetes Instructions.  ? ?Learning Style & Readiness for Change ?Teaching method utilized: Visual & Auditory  ?Demonstrated degree of understanding via: Teach Back  ?Barriers to learning/adherence to lifestyle change: BKA ? ?Goals Established by Pt ? Goals ? ?Cut out bacon and fast foods. ?Add 1/2 of cooked oatmeal and 1 serving of fruit for breakfast with egg. ?Focus on eating more fresh fruits and vegetables and whole grains-more plant based foods ?Drink only water ?Don't eat after 7 pm. ?Get A1C to 7.5% ? ? ?Lifestyle Medicine ?- Whole Food, Plant Predominant Nutrition is highly recommended: Eat Plenty of vegetables, Mushrooms, fruits, Legumes, Whole Grains, Nuts, seeds in lieu of processed meats, processed snacks/pastries red meat, poultry, eggs.  ?  ?-It is better to avoid simple carbohydrates including: Cakes, Sweet Desserts, Ice Cream, Soda (diet and regular), Sweet Tea, Candies, Chips, Cookies, Store Bought Juices, Alcohol in Excess of  1-2 drinks a day, Lemonade,  Artificial Sweeteners, Doughnuts, Coffee Creamers, "Sugar-free" Products, etc, etc.  This is not a complete list..... ? ?Exercise: If you are able: 30 -60 minutes a day ,4 days a week, or 150 minutes a week.  The longer the better.  Combine stretch, strength, and aerobic activities.  If you  were told in the past that you have high risk for cardiovascular diseases, you may seek evaluation by your heart doctor prior to initiating moderate to intense exercise programs. ? ? ? ?MONITORING & EVALUATION ?Dietary intake, weekly physical activity, and blood sugars  in 3 month. ? ?Next Steps  ?Patient is to work on better quality of foods and eating meals on time. ? ?

## 2021-10-12 NOTE — Patient Instructions (Addendum)
Goals ? ?Cut out bacon and fast foods. ?Add 1/2 of cooked oatmeal and 1 serving of fruit for breakfast with egg. ?Focus on eating more fresh fruits and vegetables and whole grains-more plant based foods ?Drink only water ?Don't eat after 7 pm. ?Get A1C to 7.5% ? ?

## 2021-12-14 ENCOUNTER — Encounter: Payer: Self-pay | Admitting: Podiatry

## 2021-12-14 ENCOUNTER — Ambulatory Visit (INDEPENDENT_AMBULATORY_CARE_PROVIDER_SITE_OTHER): Payer: Medicaid Other | Admitting: Podiatry

## 2021-12-14 DIAGNOSIS — B351 Tinea unguium: Secondary | ICD-10-CM | POA: Diagnosis not present

## 2021-12-14 DIAGNOSIS — M79675 Pain in left toe(s): Secondary | ICD-10-CM

## 2021-12-14 DIAGNOSIS — M79674 Pain in right toe(s): Secondary | ICD-10-CM | POA: Diagnosis not present

## 2021-12-14 DIAGNOSIS — E1142 Type 2 diabetes mellitus with diabetic polyneuropathy: Secondary | ICD-10-CM

## 2021-12-14 DIAGNOSIS — S88119A Complete traumatic amputation at level between knee and ankle, unspecified lower leg, initial encounter: Secondary | ICD-10-CM

## 2021-12-14 NOTE — Progress Notes (Signed)
?  Subjective:  ?Patient ID: Ricky [name], male    DOB: 1966/02/03,  MRN: [id number] ? ?Ricky [name] presents to clinic today for at risk foot care. 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 ?Patient does not recall last A1c and he forgot his blood glucose reading from this morning. ? ?New problem(s): None.  ? ?PCP is Donetta Potts, MD , and last visit was 2-3 months ago per patient recollection. ? ?No Known Allergies ? ?Review of Systems: Negative except as noted in the HPI. ? ?Objective: No changes noted in today's physical examination. ?Constitutional Trice Aspinall is a pleasant 56 y.o. African American male, WD, WN in NAD. AAO x 3.   ?Vascular Capillary refill time to remaining digits <3 seconds. Faintly palpable DP pulse(s) LLE. Faintly palpable PT pulse(s) left lower extremity. Pedal hair absent. No pain with calf compression LLE. Lower extremity skin temperature gradient WNL LLE. No ischemia or gangrene noted left lower extremity. No cyanosis or clubbing noted left lower extremity.  ?Neurologic Normal speech. Oriented to person, place, and time. Protective sensation intact with 10 gram monofilament left lower extremity. Vibratory sensation diminished left lower extremity.  ?Dermatologic Pedal skin with normal turgor, texture and tone left lower extremity. No open wounds left lower extremity. No interdigital macerations left lower extremity. Toenails 1-5 left elongated, discolored, dystrophic, thickened, and crumbly with subungual debris and tenderness to dorsal palpation. No hyperkeratotic nor porokeratotic lesions present on today's visit.  ?Orthopedic: Muscle strength 5/5 to all LE muscle groups of left lower extremity. Lower extremity amputation(s): below knee amputation right lower extremity. Pes planus deformity noted left lower extremity. Uses rollator for ambulation assistance. Wearing below knee prosthesis.  ? ?Radiographs: None ? ?  Latest Ref Rng &  Units 09/22/2021  ?  9:59 AM 06/21/2021  ? 12:00 AM 03/01/2021  ?  1:41 PM  ?Hemoglobin A1C  ?Hemoglobin-A1c 4.0 - 5.6 % 9.3   8.3      10.2    ?  ? This result is from an external source.  ? ? ?Assessment/Plan: ?1. Onychomycosis   ?2. Amputation below knee (HCC)   ?3. Diabetic peripheral neuropathy (HCC)   ?  ?-Patient was evaluated and treated. All patient's and/or POA's questions/concerns answered on today's visit. ?-Advised patient to contact his insurance to see who their vendor is for diabetic shoes. He related understanding. ?-Continue diabetic foot care principles: inspect feet daily, monitor glucose as recommended by PCP and/or Endocrinologist, and follow prescribed diet per PCP, Endocrinologist and/or dietician. ?-Patient to continue soft, supportive shoe gear daily. ?-Mycotic toenails 1-5 left foot were debrided in length and girth with sterile nail nippers and dremel without iatrogenic bleeding. ?-Patient/POA to call should there be question/concern in the interim.  ? ?Return in about 3 months (around 03/16/2022). ? ?Freddie Breech, DPM  ?

## 2021-12-16 DIAGNOSIS — N179 Acute kidney failure, unspecified: Secondary | ICD-10-CM | POA: Insufficient documentation

## 2021-12-20 ENCOUNTER — Other Ambulatory Visit (HOSPITAL_COMMUNITY): Payer: Self-pay | Admitting: Nephrology

## 2021-12-20 ENCOUNTER — Other Ambulatory Visit: Payer: Self-pay | Admitting: Nephrology

## 2021-12-20 DIAGNOSIS — E1122 Type 2 diabetes mellitus with diabetic chronic kidney disease: Secondary | ICD-10-CM

## 2021-12-20 DIAGNOSIS — D638 Anemia in other chronic diseases classified elsewhere: Secondary | ICD-10-CM

## 2021-12-21 ENCOUNTER — Encounter: Payer: Medicaid Other | Attending: Nurse Practitioner | Admitting: Nutrition

## 2021-12-21 ENCOUNTER — Ambulatory Visit (INDEPENDENT_AMBULATORY_CARE_PROVIDER_SITE_OTHER): Payer: Medicaid Other | Admitting: Nurse Practitioner

## 2021-12-21 ENCOUNTER — Encounter: Payer: Self-pay | Admitting: Nutrition

## 2021-12-21 ENCOUNTER — Telehealth: Payer: Self-pay | Admitting: Nurse Practitioner

## 2021-12-21 ENCOUNTER — Encounter: Payer: Self-pay | Admitting: Nurse Practitioner

## 2021-12-21 VITALS — BP 144/81 | HR 86 | Ht 66.0 in | Wt 193.0 lb

## 2021-12-21 DIAGNOSIS — E559 Vitamin D deficiency, unspecified: Secondary | ICD-10-CM

## 2021-12-21 DIAGNOSIS — E1165 Type 2 diabetes mellitus with hyperglycemia: Secondary | ICD-10-CM | POA: Diagnosis not present

## 2021-12-21 DIAGNOSIS — Z89511 Acquired absence of right leg below knee: Secondary | ICD-10-CM | POA: Diagnosis present

## 2021-12-21 DIAGNOSIS — E782 Mixed hyperlipidemia: Secondary | ICD-10-CM | POA: Diagnosis present

## 2021-12-21 DIAGNOSIS — I10 Essential (primary) hypertension: Secondary | ICD-10-CM | POA: Diagnosis present

## 2021-12-21 DIAGNOSIS — Z794 Long term (current) use of insulin: Secondary | ICD-10-CM | POA: Diagnosis not present

## 2021-12-21 DIAGNOSIS — E1142 Type 2 diabetes mellitus with diabetic polyneuropathy: Secondary | ICD-10-CM | POA: Insufficient documentation

## 2021-12-21 DIAGNOSIS — E118 Type 2 diabetes mellitus with unspecified complications: Secondary | ICD-10-CM | POA: Insufficient documentation

## 2021-12-21 LAB — POCT GLYCOSYLATED HEMOGLOBIN (HGB A1C): HbA1c POC (<> result, manual entry): 9.9 % (ref 4.0–5.6)

## 2021-12-21 MED ORDER — DEXCOM G6 TRANSMITTER MISC: 3 refills | Status: DC

## 2021-12-21 MED ORDER — DEXCOM G6 SENSOR MISC: 3 refills | Status: DC

## 2021-12-21 MED ORDER — DEXCOM G7 SENSOR MISC: 1.0000 "application " | 3 refills | Status: DC

## 2021-12-21 MED ORDER — DEXCOM G7 RECEIVER DEVI: 1.0000 | Freq: Once | 0 refills | Status: AC

## 2021-12-21 MED ORDER — DEXCOM G7 RECEIVER DEVI: 1.0000 | Freq: Once | 0 refills | Status: DC

## 2021-12-21 NOTE — Telephone Encounter (Signed)
Done, sent in for G6

## 2021-12-21 NOTE — Telephone Encounter (Signed)
[facility] just called and said that medicaid does not cover DEXCOM G7. If you want patient to use dexcom, it has to be G6. Can you send that in and she said it will need a PA ?

## 2021-12-21 NOTE — Progress Notes (Signed)
Medical Nutrition Therapy Follow up ?Appointment Start time:  1000 Appointment End time:  1030 ? ?Primary concerns today: DM Type 2 Follow up ?Referral diagnosis: E11.8 ?Preferred learning style: no preference ?Learning readiness: ready ? ? ?NUTRITION ASSESSMENT    ?A1C up to 9.9%  "Ive been eating a lot grapes." ? ?Saw Whitney Reardon  R BKA, has prosthesis and uses walker.Increase increaed to 46 units today per Whitney. ?Diet is low in low carb vegetables and water. He is drinking some juice and eating too much fruit. ? ?Lab Results  ?Component Value Date  ? HGBA1C 9.9 12/21/2021  ? ? ? ?FBS 210 this am. ?Saw Whitney. Increased Lantus to 46 units at night. ? ?Anthropometrics  ?Wt Readings from Last 3 Encounters:  ?12/21/21 193 lb (87.5 kg)  ?09/22/21 189 lb (85.7 kg)  ?06/22/21 189 lb (85.7 kg)  ? ?Ht Readings from Last 3 Encounters:  ?12/21/21 5\' 6"  (1.676 m)  ?09/22/21 5\' 6"  (1.676 m)  ?06/22/21 5\' 5"  (1.651 m)  ? ?There is no height or weight on file to calculate BMI. ?@BMIFA @ ?Facility age limit for growth percentiles is 20 years. ?Facility age limit for growth percentiles is 20 years. ? ?  Latest Ref Rng & Units 09/12/2021  ?  9:15 AM 11/19/2020  ?  8:51 AM 07/19/2020  ?  1:00 PM  ?CMP  ?Glucose 70 - 99 mg/dL 164   235   103    ?BUN 6 - 24 mg/dL 16   15   20     ?Creatinine 0.76 - 1.27 mg/dL 1.39   1.22   1.22    ?Sodium 134 - 144 mmol/L 143   139   138    ?Potassium 3.5 - 5.2 mmol/L 4.3   4.4   3.9    ?Chloride 96 - 106 mmol/L 102   104   103    ?CO2 20 - 29 mmol/L 25   26   26     ?Calcium 8.7 - 10.2 mg/dL 9.8   9.6   9.3    ?Total Protein 6.0 - 8.5 g/dL 7.1   7.4   7.8    ?Total Bilirubin 0.0 - 1.2 mg/dL 0.3   0.6   0.4    ?Alkaline Phos 44 - 121 IU/L 88   81   70    ?AST 0 - 40 IU/L 13   22   12     ?ALT 0 - 44 IU/L 15   41   17    ? ?Lipid Panel  ?   ?Component Value Date/Time  ? CHOL 151 09/12/2021 0915  ? TRIG 184 (H) 09/12/2021 0915  ? HDL 33 (L) 09/12/2021 0915  ? CHOLHDL 4.6 09/12/2021 0915  ? CHOLHDL 4.1  11/19/2020 0851  ? VLDL 37 11/19/2020 0851  ? [place] 86 09/12/2021 0915  ? LABVLDL 32 09/12/2021 0915  ? ?Lab Results  ?Component Value Date  ? HGBA1C 9.9 12/21/2021  ? ?  ? ?Clinical ?Medical Hx: Rt BKA.  ?Medications: Lantus 80 units, Metformin 1000 mg BID,  ?Labs:  ?Lab Results  ?Component Value Date  ? HGBA1C 9.9 12/21/2021  ? ? ?  Latest Ref Rng & Units 09/12/2021  ?  9:15 AM 11/19/2020  ?  8:51 AM 07/19/2020  ?  1:00 PM  ?CMP  ?Glucose 70 - 99 mg/dL 164   235   103    ?BUN 6 - 24 mg/dL 16   15   20     ?  Creatinine 0.76 - 1.27 mg/dL 1.39   1.22   1.22    ?Sodium 134 - 144 mmol/L 143   139   138    ?Potassium 3.5 - 5.2 mmol/L 4.3   4.4   3.9    ?Chloride 96 - 106 mmol/L 102   104   103    ?CO2 20 - 29 mmol/L 25   26   26     ?Calcium 8.7 - 10.2 mg/dL 9.8   9.6   9.3    ?Total Protein 6.0 - 8.5 g/dL 7.1   7.4   7.8    ?Total Bilirubin 0.0 - 1.2 mg/dL 0.3   0.6   0.4    ?Alkaline Phos 44 - 121 IU/L 88   81   70    ?AST 0 - 40 IU/L 13   22   12     ?ALT 0 - 44 IU/L 15   41   17    ? ?Lipid Panel  ?   ?Component Value Date/Time  ? CHOL 151 09/12/2021 0915  ? TRIG 184 (H) 09/12/2021 0915  ? HDL 33 (L) 09/12/2021 0915  ? CHOLHDL 4.6 09/12/2021 0915  ? CHOLHDL 4.1 11/19/2020 0851  ? VLDL 37 11/19/2020 0851  ? [place] 86 09/12/2021 0915  ? LABVLDL 32 09/12/2021 0915  ? ? ?Notable Signs/Symptoms: Increased thirst, frequent urination, fatigue ? ?Lifestyle & Dietary Hx ?Didn't know he had DM. Wants to improve it by eating better. ?Lives with his mother. She does most of the cooking for him and do a lot for him. He notes he doesn't know how to cook. ? ?Estimated daily fluid intake: 32 oz ?Supplements:  ?Sleep: varies ?Stress / self-care: health ?Current average weekly physical activity: ADL ? ?24-Hr Dietary Recall  ?B) 1 Eggs, Cherrios with milk, 1-2 cups, decaf coffee ?L) 6" sub-ham, juice, ?Dinner [employer], green peas, Water ? ?Estimated Energy Needs ?Calories: 1800-2000 ?Carbohydrate: 235g ?Protein: 150g ?Fat: 56 g ? ? ?NUTRITION  DIAGNOSIS  ?NB-1.1 Food and nutrition-related knowledge deficit As related to Diabetes Type 2.  As evidenced by A1C 10.2%. ? ? ?NUTRITION INTERVENTION  ?Nutrition education (E-1) on the following topics:  ?Nutrition and Diabetes education provided on My Plate, CHO counting, meal planning, portion sizes, timing of meals, avoiding snacks between meals unless having a low blood sugar, target ranges for A1C and blood sugars, signs/symptoms and treatment of hyper/hypoglycemia, monitoring blood sugars, taking medications as prescribed, benefits of exercising 30 minutes per day and prevention of complications of DM. ? ? ?Handouts Provided Include  ?MY Plate ?Meal Plan Card ?Diabetes Instructions.  ? ?Learning Style & Readiness for Change ?Teaching method utilized: Visual & Auditory  ?Demonstrated degree of understanding via: Teach Back  ?Barriers to learning/adherence to lifestyle change: BKA ? ?Goals Established by Pt ? ?Increase low carb vegetables ?Cut out grapes for now ?Eat veggies for snacks ?Drink 5 bottles of water per day ?Take Medications as discussed. ?46 Units of Lantus at night ?Lifestyle Medicine ?- Whole Food, Plant Predominant Nutrition is highly recommended: Eat Plenty of vegetables, Mushrooms, fruits, Legumes, Whole Grains, Nuts, seeds in lieu of processed meats, processed snacks/pastries red meat, poultry, eggs.  ?  ?-It is better to avoid simple carbohydrates including: Cakes, Sweet Desserts, Ice Cream, Soda (diet and regular), Sweet Tea, Candies, Chips, Cookies, Store Bought Juices, Alcohol in Excess of  1-2 drinks a day, Lemonade,  Artificial Sweeteners, Doughnuts, Coffee Creamers, "Sugar-free" Products, etc, etc.  This is not a  complete list..... ? ?Exercise: If you are able: 30 -60 minutes a day ,4 days a week, or 150 minutes a week.  The longer the better.  Combine stretch, strength, and aerobic activities.  If you were told in the past that you have high risk for cardiovascular diseases, you may  seek evaluation by your heart doctor prior to initiating moderate to intense exercise programs. ? ?MONITORING & EVALUATION ?Dietary intake, weekly physical activity, and blood sugars  in 3 month. ? ?Next Steps  ?Patient is to work on better quality of foods and eating meals on time. ? ?

## 2021-12-21 NOTE — Patient Instructions (Signed)
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
## 2021-12-21 NOTE — Patient Instructions (Signed)
Goals ? ?Increase low carb vegetables ?Cut out grapes for now ?Eat veggies for snacks ?Drink 5 bottles of water per day ?Take Medications as discussed. ?46 Units of Lantus at night ? ?

## 2021-12-28 ENCOUNTER — Ambulatory Visit (HOSPITAL_COMMUNITY)
Admission: RE | Admit: 2021-12-28 | Discharge: 2021-12-28 | Disposition: A | Discharge status: Home / Self Care | Payer: Medicaid Other | Source: Ambulatory Visit | Attending: Nephrology | Admitting: Nephrology

## 2021-12-28 DIAGNOSIS — D638 Anemia in other chronic diseases classified elsewhere: Secondary | ICD-10-CM | POA: Diagnosis present

## 2021-12-28 DIAGNOSIS — E1122 Type 2 diabetes mellitus with diabetic chronic kidney disease: Secondary | ICD-10-CM

## 2022-03-20 ENCOUNTER — Encounter: Payer: Self-pay | Admitting: Podiatry

## 2022-03-20 ENCOUNTER — Ambulatory Visit (INDEPENDENT_AMBULATORY_CARE_PROVIDER_SITE_OTHER): Payer: Medicaid Other | Admitting: Podiatry

## 2022-03-20 DIAGNOSIS — E1142 Type 2 diabetes mellitus with diabetic polyneuropathy: Secondary | ICD-10-CM

## 2022-03-20 DIAGNOSIS — B351 Tinea unguium: Secondary | ICD-10-CM | POA: Diagnosis not present

## 2022-03-20 DIAGNOSIS — S88119A Complete traumatic amputation at level between knee and ankle, unspecified lower leg, initial encounter: Secondary | ICD-10-CM

## 2022-03-23 ENCOUNTER — Other Ambulatory Visit: Payer: Self-pay

## 2022-03-23 DIAGNOSIS — E1165 Type 2 diabetes mellitus with hyperglycemia: Secondary | ICD-10-CM

## 2022-03-24 ENCOUNTER — Ambulatory Visit: Payer: Medicaid Other | Admitting: Nurse Practitioner

## 2022-03-26 NOTE — Progress Notes (Signed)
  Subjective:  Patient ID: Ricky [name], male    DOB: [birth year],  MRN: [id number]  Ricky [name] presents to clinic today for at risk foot care. 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  Patient did not check blood glucose today.  New problem(s): None.   PCP is Donetta Potts, MD , and last visit was July, 2023.  No Known Allergies  Review of Systems: Negative except as noted in the HPI.  Objective: No changes noted in today's physical examination. Constitutional Ricky [name] is a pleasant 56 y.o. African American male, WD, WN in NAD. AAO x 3.   Vascular Capillary refill time to remaining digits <3 seconds. Faintly palpable DP pulse(s) LLE. Faintly palpable PT pulse(s) left lower extremity. Pedal hair absent. No pain with calf compression LLE. Lower extremity skin temperature gradient WNL LLE. No ischemia or gangrene noted left lower extremity. No cyanosis or clubbing noted left lower extremity.  Neurologic Normal speech. Oriented to person, place, and time. Protective sensation intact with 10 gram monofilament left lower extremity. Vibratory sensation diminished left lower extremity.  Dermatologic Pedal skin with normal turgor, texture and tone left lower extremity. No open wounds left lower extremity. No interdigital macerations left lower extremity. Toenails 1-5 left elongated, discolored, dystrophic, thickened, and crumbly with subungual debris and tenderness to dorsal palpation. No hyperkeratotic nor porokeratotic lesions present on today's visit.  Orthopedic: Muscle strength 5/5 to all LE muscle groups of left lower extremity. Lower extremity amputation(s): below knee amputation right lower extremity. Pes planus deformity noted left lower extremity. Uses rollator for ambulation assistance. Wearing below knee prosthesis.   Radiographs: None    Latest Ref Rng & Units 12/21/2021    9:23 AM 09/22/2021    9:59 AM 06/21/2021    12:00 AM  Hemoglobin A1C  Hemoglobin-A1c 4.0 - 5.6 % 9.9  9.3  8.3         This result is from an external source.   Assessment/Plan: 1. Onychomycosis   2. Amputation below knee (HCC)   3. Diabetic peripheral neuropathy (HCC)      -Examined patient. -Continue foot and shoe inspections daily. Monitor blood glucose per PCP/Endocrinologist's recommendations. -Patient to continue soft, supportive shoe gear daily. -Toenails 1-5 left foot debrided in length and girth without iatrogenic bleeding with sterile nail nipper and dremel.  -Patient/POA to call should there be question/concern in the interim.   Return in about 3 months (around 06/20/2022).  Freddie Breech, DPM

## 2022-03-27 ENCOUNTER — Encounter: Payer: Medicaid Other | Attending: Nurse Practitioner | Admitting: Nutrition

## 2022-03-27 ENCOUNTER — Ambulatory Visit: Payer: Medicaid Other | Admitting: Nurse Practitioner

## 2022-03-27 ENCOUNTER — Encounter: Payer: Self-pay | Admitting: Nutrition

## 2022-03-27 ENCOUNTER — Encounter: Payer: Self-pay | Admitting: Nurse Practitioner

## 2022-03-27 ENCOUNTER — Ambulatory Visit (INDEPENDENT_AMBULATORY_CARE_PROVIDER_SITE_OTHER): Payer: Medicaid Other | Admitting: Nurse Practitioner

## 2022-03-27 VITALS — BP 123/79 | HR 82 | Ht 66.0 in | Wt 195.0 lb

## 2022-03-27 DIAGNOSIS — E559 Vitamin D deficiency, unspecified: Secondary | ICD-10-CM

## 2022-03-27 DIAGNOSIS — Z89511 Acquired absence of right leg below knee: Secondary | ICD-10-CM | POA: Insufficient documentation

## 2022-03-27 DIAGNOSIS — E782 Mixed hyperlipidemia: Secondary | ICD-10-CM | POA: Insufficient documentation

## 2022-03-27 DIAGNOSIS — E1165 Type 2 diabetes mellitus with hyperglycemia: Secondary | ICD-10-CM | POA: Diagnosis not present

## 2022-03-27 DIAGNOSIS — E118 Type 2 diabetes mellitus with unspecified complications: Secondary | ICD-10-CM | POA: Insufficient documentation

## 2022-03-27 DIAGNOSIS — I10 Essential (primary) hypertension: Secondary | ICD-10-CM | POA: Diagnosis present

## 2022-03-27 DIAGNOSIS — E1142 Type 2 diabetes mellitus with diabetic polyneuropathy: Secondary | ICD-10-CM | POA: Insufficient documentation

## 2022-03-27 DIAGNOSIS — Z794 Long term (current) use of insulin: Secondary | ICD-10-CM

## 2022-03-27 LAB — POCT GLYCOSYLATED HEMOGLOBIN (HGB A1C): HbA1c POC (<> result, manual entry): 7.8 % (ref 4.0–5.6)

## 2022-03-27 MED ORDER — GLIPIZIDE ER 5 MG PO TB24: 5.0000 mg | ORAL_TABLET | Freq: Every day | ORAL | 3 refills | Status: DC

## 2022-03-27 MED ORDER — METFORMIN HCL ER 500 MG PO TB24: 1000.0000 mg | ORAL_TABLET | Freq: Every day | ORAL | 3 refills | Status: DC

## 2022-03-27 MED ORDER — ACCU-CHEK GUIDE VI STRP: ORAL_STRIP | 12 refills | Status: AC

## 2022-03-27 MED ORDER — INSULIN GLARGINE 100 UNIT/ML ~~LOC~~ SOLN: 46.0000 [IU] | Freq: Every day | SUBCUTANEOUS | 3 refills | Status: DC
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
## 2022-03-27 NOTE — Progress Notes (Signed)
Medical Nutrition Therapy Follow up Appointment Start time:  0845 Appointment End time:  0900  Primary concerns today: DM Type 2 Follow up Referral diagnosis: E11.8 Preferred learning style: no preference Learning readiness: ready   NUTRITION ASSESSMENT   Follow up Dm A1C down to 7.8% from 9.9%.  He notes he is doing a lot better recently. He is eating more vegetables and fruit and drinking only water now. No longer drinking tea or sodas. He is in a motorized wheelchair now. Lantus 46 units, Metformin 1000 mg Er a day, Glipizide XL 5 mg, Farxiga 10 mg a day.  Lab Results  Component Value Date   HGBA1C 7.8 03/27/2022  Anthropometrics  Wt Readings from Last 3 Encounters:  03/27/22 195 lb (88.5 kg)  12/21/21 193 lb (87.5 kg)  09/22/21 189 lb (85.7 kg)   Ht Readings from Last 3 Encounters:  03/27/22 5\' 6"  (1.676 m)  12/21/21 5\' 6"  (1.676 m)  09/22/21 5\' 6"  (1.676 m)   There is no height or weight on file to calculate BMI. @BMIFA @ Facility age limit for growth %iles is 20 years. Facility age limit for growth %iles is 20 years.    Latest Ref Rng & Units 09/12/2021    9:15 AM 11/19/2020    8:51 AM 07/19/2020    1:00 PM  CMP  Glucose 70 - 99 mg/dL  09/14/2021  01/19/2021   BUN 6 - 24 mg/dL 16  15  20    Creatinine 0.76 - 1.27 mg/dL 14/01/2020  203  559   Sodium 134 - 144 mmol/L 143  139  138   Potassium 3.5 - 5.2 mmol/L 4.3  4.4  3.9   Chloride 96 - 106 mmol/L 102  104  103   CO2 20 - 29 mmol/L 25  26  26    Calcium 8.7 - 10.2 mg/dL 9.8  9.6  9.3   Total Protein 6.0 - 8.5 g/dL 7.1  7.4  7.8   Total Bilirubin 0.0 - 1.2 mg/dL 0.3  0.6  0.4   Alkaline Phos 44 - 121 IU/L 88  81  70   AST 0 - 40 IU/L 13  22  12    ALT 0 - 44 IU/L 15  41  17    Lipid Panel     Component Value Date/Time   CHOL 151 09/12/2021 0915   TRIG 184 (H) 09/12/2021 0915   HDL 33 (L) 09/12/2021 0915   CHOLHDL 4.6 09/12/2021 0915   CHOLHDL 4.1 11/19/2020 0851   VLDL 37 11/19/2020 0851   LDLCALC 86 09/12/2021 0915    LABVLDL 32 09/12/2021 0915   Lab Results  Component Value Date   HGBA1C 7.8 03/27/2022      Clinical Medical Hx: Rt BKA.  Medications: Lantus 46 units, Metformin XR 1000 mg Labs:  Lab Results  Component Value Date   HGBA1C 7.8 03/27/2022      Latest Ref Rng & Units 09/12/2021    9:15 AM 11/19/2020    8:51 AM 07/19/2020    1:00 PM  CMP  Glucose 70 - 99 mg/dL 09/14/2021  09/14/2021  03/29/2022   BUN 6 - 24 mg/dL 16  15  20    Creatinine 0.76 - 1.27 mg/dL 03/29/2022  09/14/2021  01/19/2021   Sodium 134 - 144 mmol/L 143  139  138   Potassium 3.5 - 5.2 mmol/L 4.3  4.4  3.9   Chloride 96 - 106 mmol/L 102  104  103   CO2 20 -  29 mmol/L 25  26  26    Calcium 8.7 - 10.2 mg/dL 9.8  9.6  9.3   Total Protein 6.0 - 8.5 g/dL 7.1  7.4  7.8   Total Bilirubin 0.0 - 1.2 mg/dL 0.3  0.6  0.4   Alkaline Phos 44 - 121 IU/L 88  81  70   AST 0 - 40 IU/L 13  22  12    ALT 0 - 44 IU/L 15  41  17    Lipid Panel     Component Value Date/Time   CHOL 151 09/12/2021 0915   TRIG 184 (H) 09/12/2021 0915   HDL 33 (L) 09/12/2021 0915   CHOLHDL 4.6 09/12/2021 0915   CHOLHDL 4.1 11/19/2020 0851   VLDL 37 11/19/2020 0851   LDLCALC 86 09/12/2021 0915   LABVLDL 32 09/12/2021 0915    Notable Signs/Symptoms: Increased thirst, frequent urination, fatigue  Lifestyle & Dietary Hx Didn't know he had DM. Wants to improve it by eating better. Lives with his mother. She does most of the cooking for him and does a lot for him. He notes he doesn't know how to cook.  Estimated daily fluid intake: 32 oz Supplements:  Sleep: varies Stress / self-care: health Current average weekly physical activity: ADL  24-Hr Dietary Recall  B) 1 Eggs, Cherrios with milk, 1-2 cups, decaf coffee L) 6" sub-ham, juice, Dinner Roast, green peas, Water  Estimated Energy Needs Calories: 1800-2000 Carbohydrate: 235g Protein: 150g Fat: 56 g   NUTRITION DIAGNOSIS  NB-1.1 Food and nutrition-related knowledge deficit As related to Diabetes Type 2.  As evidenced by A1C  10.2%.   NUTRITION INTERVENTION  Nutrition education (E-1) on the following topics:  Lifestyle Medicine  - Whole Food, Plant Predominant Nutrition is highly recommended: Eat Plenty of vegetables, Mushrooms, fruits, Legumes, Whole Grains, Nuts, seeds in lieu of processed meats, processed snacks/pastries red meat, poultry, eggs.    -It is better to avoid simple carbohydrates including: Cakes, Sweet Desserts, Ice Cream, Soda (diet and regular), Sweet Tea, Candies, Chips, Cookies, Store Bought Juices, Alcohol in Excess of  1-2 drinks a day, Lemonade,  Artificial Sweeteners, Doughnuts, Coffee Creamers, "Sugar-free" Products, etc, etc.  This is not a complete list.....  Exercise: If you are able: 30 -60 minutes a day ,4 days a week, or 150 minutes a week.  The longer the better.  Combine stretch, strength, and aerobic activities.  If you were told in the past that you have high risk for cardiovascular diseases, you may seek evaluation by your heart doctor prior to initiating moderate to intense exercise programs.    Handouts Provided Include  MY Plate Meal Plan Card Diabetes Instructions.   Learning Style & Readiness for Change Teaching method utilized: Visual & Auditory  Demonstrated degree of understanding via: Teach Back  Barriers to learning/adherence to lifestyle change: BKA  Goals Established by Pt  Focus on eating more whole plant based foods--foods from garden. Keep up the great job! Avoid processed foods of biscuits, sausage, bacon and junk food. Get A1C down to 7%. Cut out snacks between meals and after supper. Try doing chair exercises for desired weight loss and strength.   Lifestyle Medicine - Whole Food, Plant Predominant Nutrition is highly recommended: Eat Plenty of vegetables, Mushrooms, fruits, Legumes, Whole Grains, Nuts, seeds in lieu of processed meats, processed snacks/pastries red meat, poultry, eggs.    -It is better to avoid simple carbohydrates including:  Cakes, Sweet Desserts, Ice Cream, Soda (diet and regular), Sweet  Tea, Candies, Chips, Cookies, Store Bought Juices, Alcohol in Excess of  1-2 drinks a day, Lemonade,  Artificial Sweeteners, Doughnuts, Coffee Creamers, "Sugar-free" Products, etc, etc.  This is not a complete list.....  Exercise: If you are able: 30 -60 minutes a day ,4 days a week, or 150 minutes a week.  The longer the better.  Combine stretch, strength, and aerobic activities.  If you were told in the past that you have high risk for cardiovascular diseases, you may seek evaluation by your heart doctor prior to initiating moderate to intense exercise programs.  MONITORING & EVALUATION Dietary intake, weekly physical activity, and blood sugars  in 3 month.  Next Steps  Patient is to continue to focus on eating healthier foods.

## 2022-03-27 NOTE — Patient Instructions (Addendum)
Focus on eating more whole plant based foods--foods from garden. Keep up the great job! Avoid processed foods of biscuits, sausage, bacon and junk food. Get A1C down to 7%. Cut out snacks between meals and after supper. Try doing chair exercises for desired weight loss and strength.

## 2022-06-26 ENCOUNTER — Encounter: Payer: Self-pay | Admitting: Podiatry

## 2022-06-26 ENCOUNTER — Ambulatory Visit (INDEPENDENT_AMBULATORY_CARE_PROVIDER_SITE_OTHER): Payer: Medicaid Other | Admitting: Podiatry

## 2022-06-26 DIAGNOSIS — E119 Type 2 diabetes mellitus without complications: Secondary | ICD-10-CM | POA: Diagnosis not present

## 2022-06-26 DIAGNOSIS — S88119A Complete traumatic amputation at level between knee and ankle, unspecified lower leg, initial encounter: Secondary | ICD-10-CM | POA: Diagnosis not present

## 2022-06-26 DIAGNOSIS — B351 Tinea unguium: Secondary | ICD-10-CM

## 2022-06-26 DIAGNOSIS — E1142 Type 2 diabetes mellitus with diabetic polyneuropathy: Secondary | ICD-10-CM | POA: Diagnosis not present
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
## 2022-07-12 DIAGNOSIS — R768 Other specified abnormal immunological findings in serum: Secondary | ICD-10-CM | POA: Insufficient documentation

## 2022-07-27 ENCOUNTER — Ambulatory Visit: Payer: Medicaid Other | Admitting: Nurse Practitioner

## 2022-07-27 DIAGNOSIS — E1165 Type 2 diabetes mellitus with hyperglycemia: Secondary | ICD-10-CM

## 2022-07-27 DIAGNOSIS — E782 Mixed hyperlipidemia: Secondary | ICD-10-CM

## 2022-07-27 DIAGNOSIS — E559 Vitamin D deficiency, unspecified: Secondary | ICD-10-CM

## 2022-07-27 DIAGNOSIS — I10 Essential (primary) hypertension: Secondary | ICD-10-CM

## 2022-07-28 LAB — LIPID PANEL
Chol/HDL Ratio: 4.3 ratio (ref 0.0–5.0)
Cholesterol, Total: 149 mg/dL (ref 100–199)
HDL: 35 mg/dL — ABNORMAL LOW (ref >39)
LDL Chol Calc (NIH): 87 mg/dL (ref 0–99)
Triglycerides: 152 mg/dL — ABNORMAL HIGH (ref 0–149)
VLDL Cholesterol Cal: 27 mg/dL (ref 5–40)

## 2022-07-28 LAB — COMPREHENSIVE METABOLIC PANEL
ALT: 14 IU/L (ref 0–44)
AST: 14 IU/L (ref 0–40)
Albumin/Globulin Ratio: 1.5 (ref 1.2–2.2)
Albumin: 4.2 g/dL (ref 3.8–4.9)
Alkaline Phosphatase: 80 IU/L (ref 44–121)
BUN/Creatinine Ratio: 11 (ref 9–20)
BUN: 14 mg/dL (ref 6–24)
Bilirubin Total: 0.2 mg/dL (ref 0.0–1.2)
CO2: 25 mmol/L (ref 20–29)
Calcium: 9.6 mg/dL (ref 8.7–10.2)
Chloride: 104 mmol/L (ref 96–106)
Creatinine, Ser: 1.23 mg/dL (ref 0.76–1.27)
Globulin, Total: 2.8 g/dL (ref 1.5–4.5)
Glucose: 63 mg/dL — ABNORMAL LOW (ref 70–99)
Potassium: 4.4 mmol/L (ref 3.5–5.2)
Sodium: 142 mmol/L (ref 134–144)
Total Protein: 7 g/dL (ref 6.0–8.5)
eGFR: 69 mL/min/{1.73_m2} (ref >59)

## 2022-07-28 LAB — TSH: TSH: 1.97 u[IU]/mL (ref 0.450–4.500)

## 2022-07-28 LAB — T4, FREE: Free T4: 1.25 ng/dL (ref 0.82–1.77)

## 2022-07-28 LAB — VITAMIN D 25 HYDROXY (VIT D DEFICIENCY, FRACTURES): Vit D, 25-Hydroxy: 29.3 ng/mL — ABNORMAL LOW (ref 30.0–100.0)

## 2022-08-17 ENCOUNTER — Encounter: Payer: Self-pay | Admitting: Nurse Practitioner

## 2022-08-17 ENCOUNTER — Ambulatory Visit (INDEPENDENT_AMBULATORY_CARE_PROVIDER_SITE_OTHER): Payer: Medicaid Other | Admitting: Nurse Practitioner

## 2022-08-17 VITALS — BP 129/80 | HR 80 | Ht 66.0 in | Wt 193.4 lb

## 2022-08-17 DIAGNOSIS — I10 Essential (primary) hypertension: Secondary | ICD-10-CM

## 2022-08-17 DIAGNOSIS — Z794 Long term (current) use of insulin: Secondary | ICD-10-CM | POA: Diagnosis not present

## 2022-08-17 DIAGNOSIS — E782 Mixed hyperlipidemia: Secondary | ICD-10-CM

## 2022-08-17 DIAGNOSIS — E119 Type 2 diabetes mellitus without complications: Secondary | ICD-10-CM | POA: Diagnosis not present

## 2022-08-17 DIAGNOSIS — E559 Vitamin D deficiency, unspecified: Secondary | ICD-10-CM | POA: Diagnosis not present

## 2022-08-17 DIAGNOSIS — E1165 Type 2 diabetes mellitus with hyperglycemia: Secondary | ICD-10-CM

## 2022-08-17 LAB — POCT GLYCOSYLATED HEMOGLOBIN (HGB A1C): Hemoglobin A1C: 6.9 % — AB (ref 4.0–5.6)

## 2022-08-17 MED ORDER — METFORMIN HCL ER 500 MG PO TB24: 1000.0000 mg | ORAL_TABLET | Freq: Every day | ORAL | 3 refills | Status: DC

## 2022-08-17 MED ORDER — DEXCOM G7 RECEIVER DEVI: 1.0000 | Freq: Once | 0 refills | Status: AC

## 2022-08-17 MED ORDER — GLIPIZIDE ER 5 MG PO TB24: 5.0000 mg | ORAL_TABLET | Freq: Every day | ORAL | 3 refills | Status: DC

## 2022-08-17 MED ORDER — INSULIN GLARGINE 100 UNIT/ML ~~LOC~~ SOLN
30.0000 [IU] | Freq: Every day | SUBCUTANEOUS | 3 refills | Status: DC
Start: 2022-08-17 — End: 2023-04-13

## 2022-08-17 MED ORDER — DEXCOM G7 SENSOR MISC: 1.0000 | 3 refills | Status: DC

## 2022-08-17 MED ORDER — DAPAGLIFLOZIN PROPANEDIOL 10 MG PO TABS: 10.0000 mg | ORAL_TABLET | Freq: Every day | ORAL | 3 refills | Status: DC
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
## 2022-10-16 ENCOUNTER — Encounter: Payer: Self-pay | Admitting: Podiatry

## 2022-10-16 ENCOUNTER — Ambulatory Visit (INDEPENDENT_AMBULATORY_CARE_PROVIDER_SITE_OTHER): Payer: Medicaid Other | Admitting: Podiatry

## 2022-10-16 VITALS — BP 162/98

## 2022-10-16 DIAGNOSIS — S88119A Complete traumatic amputation at level between knee and ankle, unspecified lower leg, initial encounter: Secondary | ICD-10-CM

## 2022-10-16 DIAGNOSIS — B351 Tinea unguium: Secondary | ICD-10-CM

## 2022-10-16 DIAGNOSIS — E1142 Type 2 diabetes mellitus with diabetic polyneuropathy: Secondary | ICD-10-CM

## 2022-10-16 DIAGNOSIS — L601 Onycholysis: Secondary | ICD-10-CM

## 2022-10-16 NOTE — Progress Notes (Unsigned)
  Subjective:  Patient ID: Ricky [name], male    DOB: June 10, 1966,  MRN: [id number]  Ricky [name] presents to clinic today for at risk foot care. 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  No Known Allergies  Review of Systems: Negative except as noted in the HPI.  Objective:  Vitals:   10/16/22 0854  BP: (!) 162/98   Ricky [name] is a pleasant 57 y.o. male in NAD. AAO x 3.  Vascular Examination: Capillary refill time to digits <4 seconds left foot. Palpable DP pulse(s) left lower extremity Palpable PT pulse(s) left lower extremity Pedal hair present. No edema noted left lower extremity. No ischemia or gangrene noted left lower extremity. No cyanosis or clubbing noted left lower extremity.  Dermatological Examination: Skin warm and supple left lower extremity. No open wounds left lower extremity. Toenails 1, 2, 4,5 left foot elongated, discolored, dystrophic, thickened, and crumbly with subungual debris and tenderness to dorsal palpation.   There is noted onchyolysis of entire nailplate of L 3rd toe.  The nailbed remains intact. There is no erythema, no edema, no drainage, no underlying fluctuance.  No hyperkeratotic nor porokeratotic lesions present on today's visit.  Neurological Examination: Protective sensation intact with 10 gram monofilament left lower extremity. Vibratory sensation intact left lower extremity. Proprioception intact bilaterally.  Musculoskeletal Examination: Muscle strength 5/5 to all LE muscle groups of left lower extremity. No gross bony deformities left lower extremity. Lower extremity amputation(s): below knee amputation right lower extremity. Utilizes motorized chair for mobility assistance.  Assessment/Plan: 1. Onychomycosis   2.  Onycholysis of toenail   3. Amputation below knee ([place])   4. Diabetic peripheral neuropathy ([place])     -Consent given for treatment as described below: -Examined patient. -Continue foot and shoe inspections daily. Monitor blood glucose per PCP/Endocrinologist's recommendations. -Toenails L hallux, left second digit, left third digit, and left fifth digit debrided in length and girth without iatrogenic bleeding with sterile nail nipper and dremel.  -Loose nailplate left fourth digit gently debrided to level of adherence. Digit cleansed with alcohol. triple antibiotic ointment applied to nailbed followed by light dressing. No further treatment required by patient. -Patient/POA to call should there be question/concern in the interim.   Return in about 3 months (around 01/16/2023).  Marzetta Board, DPM

## 2022-11-14 ENCOUNTER — Other Ambulatory Visit (HOSPITAL_COMMUNITY): Payer: Self-pay

## 2022-11-21 DIAGNOSIS — N189 Chronic kidney disease, unspecified: Secondary | ICD-10-CM | POA: Diagnosis not present

## 2022-11-21 DIAGNOSIS — I129 Hypertensive chronic kidney disease with stage 1 through stage 4 chronic kidney disease, or unspecified chronic kidney disease: Secondary | ICD-10-CM | POA: Diagnosis not present

## 2022-11-21 DIAGNOSIS — E1129 Type 2 diabetes mellitus with other diabetic kidney complication: Secondary | ICD-10-CM | POA: Diagnosis not present

## 2022-11-21 DIAGNOSIS — E1122 Type 2 diabetes mellitus with diabetic chronic kidney disease: Secondary | ICD-10-CM | POA: Diagnosis not present

## 2022-11-21 DIAGNOSIS — R809 Proteinuria, unspecified: Secondary | ICD-10-CM | POA: Diagnosis not present

## 2022-11-22 DIAGNOSIS — E113511 Type 2 diabetes mellitus with proliferative diabetic retinopathy with macular edema, right eye: Secondary | ICD-10-CM | POA: Diagnosis not present

## 2022-11-24 DIAGNOSIS — S88119A Complete traumatic amputation at level between knee and ankle, unspecified lower leg, initial encounter: Secondary | ICD-10-CM | POA: Diagnosis not present

## 2022-11-24 DIAGNOSIS — R718 Other abnormality of red blood cells: Secondary | ICD-10-CM | POA: Diagnosis not present

## 2022-11-24 DIAGNOSIS — E1169 Type 2 diabetes mellitus with other specified complication: Secondary | ICD-10-CM | POA: Diagnosis not present

## 2022-11-24 DIAGNOSIS — E559 Vitamin D deficiency, unspecified: Secondary | ICD-10-CM | POA: Diagnosis not present

## 2022-11-24 DIAGNOSIS — I10 Essential (primary) hypertension: Secondary | ICD-10-CM | POA: Diagnosis not present

## 2022-11-28 ENCOUNTER — Other Ambulatory Visit: Payer: Self-pay | Admitting: "Endocrinology

## 2022-12-09 ENCOUNTER — Other Ambulatory Visit: Payer: Self-pay | Admitting: "Endocrinology

## 2022-12-19 ENCOUNTER — Ambulatory Visit: Payer: 59 | Admitting: Nurse Practitioner

## 2022-12-19 DIAGNOSIS — E782 Mixed hyperlipidemia: Secondary | ICD-10-CM

## 2022-12-19 DIAGNOSIS — E559 Vitamin D deficiency, unspecified: Secondary | ICD-10-CM

## 2022-12-19 DIAGNOSIS — E119 Type 2 diabetes mellitus without complications: Secondary | ICD-10-CM

## 2022-12-19 DIAGNOSIS — I10 Essential (primary) hypertension: Secondary | ICD-10-CM

## 2022-12-26 DIAGNOSIS — E113512 Type 2 diabetes mellitus with proliferative diabetic retinopathy with macular edema, left eye: Secondary | ICD-10-CM | POA: Diagnosis not present

## 2023-01-02 ENCOUNTER — Ambulatory Visit (INDEPENDENT_AMBULATORY_CARE_PROVIDER_SITE_OTHER): Payer: 59 | Admitting: Nurse Practitioner

## 2023-01-02 ENCOUNTER — Encounter: Payer: Self-pay | Admitting: Nurse Practitioner

## 2023-01-02 VITALS — BP 134/86 | HR 71 | Ht 66.0 in | Wt 193.0 lb

## 2023-01-02 DIAGNOSIS — E559 Vitamin D deficiency, unspecified: Secondary | ICD-10-CM

## 2023-01-02 DIAGNOSIS — I10 Essential (primary) hypertension: Secondary | ICD-10-CM | POA: Diagnosis not present

## 2023-01-02 DIAGNOSIS — E119 Type 2 diabetes mellitus without complications: Secondary | ICD-10-CM

## 2023-01-02 DIAGNOSIS — E782 Mixed hyperlipidemia: Secondary | ICD-10-CM

## 2023-01-02 DIAGNOSIS — Z794 Long term (current) use of insulin: Secondary | ICD-10-CM

## 2023-01-02 LAB — POCT GLYCOSYLATED HEMOGLOBIN (HGB A1C): Hemoglobin A1C: 7.2 % — AB (ref 4.0–5.6)
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
## 2023-01-03 DIAGNOSIS — E1165 Type 2 diabetes mellitus with hyperglycemia: Secondary | ICD-10-CM | POA: Diagnosis not present

## 2023-01-09 ENCOUNTER — Ambulatory Visit (INDEPENDENT_AMBULATORY_CARE_PROVIDER_SITE_OTHER): Payer: 59 | Admitting: "Endocrinology

## 2023-01-09 DIAGNOSIS — Z794 Long term (current) use of insulin: Secondary | ICD-10-CM

## 2023-01-09 DIAGNOSIS — E119 Type 2 diabetes mellitus without complications: Secondary | ICD-10-CM | POA: Diagnosis not present

## 2023-01-09 NOTE — Progress Notes (Signed)
Patient presented to the office today for Dexcom G7 training/application. Sensor was applied to the left arm , (back). The receiver was explained to the patient as well as how to apply the sensor. Patient voiced understanding to both. Patient tolerated well.

## 2023-01-12 ENCOUNTER — Ambulatory Visit: Payer: 59 | Admitting: Nurse Practitioner

## 2023-01-12 DIAGNOSIS — E119 Type 2 diabetes mellitus without complications: Secondary | ICD-10-CM

## 2023-01-12 NOTE — Progress Notes (Signed)
Patient presented to the office this morning for help in replacing a sensor. He says that it showed that the sensor had failed and he took it out. Patient brought his mother to watch as he became nervous about replacing it.  A new sensor was placed in the back of upper left arm. Then paired with receiver. Patient and mother watched, and ll their questions were answered. Patient shared that he may want to come back one more time to ensure that he does it correctly, he was advised that he will need to make a nurse appointment.

## 2023-01-16 ENCOUNTER — Ambulatory Visit (INDEPENDENT_AMBULATORY_CARE_PROVIDER_SITE_OTHER): Payer: 59

## 2023-01-16 VITALS — BP 130/71 | Ht 66.0 in | Wt 193.0 lb

## 2023-01-16 DIAGNOSIS — E1165 Type 2 diabetes mellitus with hyperglycemia: Secondary | ICD-10-CM

## 2023-01-16 DIAGNOSIS — Z794 Long term (current) use of insulin: Secondary | ICD-10-CM | POA: Diagnosis not present

## 2023-01-16 NOTE — Progress Notes (Signed)
Pt comes in today having issues with his Dexcom G7. He states that it was not working. The sensor that the pt was wearing was not attached completely. Removed the sensor pt had on. Went over application instructions and placed a new sensor on pts L arm. Pt voiced understanding of the application process.

## 2023-01-31 DIAGNOSIS — E113511 Type 2 diabetes mellitus with proliferative diabetic retinopathy with macular edema, right eye: Secondary | ICD-10-CM | POA: Diagnosis not present

## 2023-02-03 DIAGNOSIS — E1165 Type 2 diabetes mellitus with hyperglycemia: Secondary | ICD-10-CM | POA: Diagnosis not present

## 2023-02-05 ENCOUNTER — Ambulatory Visit: Payer: 59

## 2023-02-14 ENCOUNTER — Ambulatory Visit (INDEPENDENT_AMBULATORY_CARE_PROVIDER_SITE_OTHER): Payer: 59 | Admitting: Podiatry

## 2023-02-14 DIAGNOSIS — B351 Tinea unguium: Secondary | ICD-10-CM

## 2023-02-14 DIAGNOSIS — E1142 Type 2 diabetes mellitus with diabetic polyneuropathy: Secondary | ICD-10-CM | POA: Diagnosis not present

## 2023-02-14 DIAGNOSIS — Z89511 Acquired absence of right leg below knee: Secondary | ICD-10-CM | POA: Diagnosis not present

## 2023-02-14 NOTE — Progress Notes (Signed)
  Subjective:  Patient ID: Ricky [name], male    DOB: 1966-06-30,  MRN: [id number]  Ricky [name] presents to clinic today for at risk foot care. 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   PCP is Donetta Potts, MD.  No Known Allergies  Review of Systems: Negative except as noted in the HPI.  Objective: No changes noted in today's physical examination. There were no vitals filed for this visit. Ricky [name] is a pleasant 57 y.o. male WD, WN in NAD. AAO x 3.  Vascular Examination: Capillary refill time to digits <4 seconds left foot. Palpable DP pulse(s) left lower extremity Palpable PT pulse(s) left lower extremity Pedal hair present. No edema noted left lower extremity. No ischemia or gangrene noted left lower extremity. No cyanosis or clubbing noted left lower extremity.  Dermatological Examination: Skin warm and supple left lower extremity. No open wounds left lower extremity. Toenails 1-5 left foot elongated, discolored, dystrophic, thickened, and crumbly with subungual debris and tenderness to dorsal palpation.   No hyperkeratotic nor porokeratotic lesions present on today's visit.  Neurological Examination: Protective sensation intact with 10 gram monofilament left lower extremity. Vibratory sensation intact left lower extremity. Proprioception intact bilaterally.  Musculoskeletal Examination: Muscle strength 5/5 to all LE muscle groups of left lower extremity. No gross bony deformities left lower extremity. Lower extremity amputation(s): below knee amputation right lower extremity. Utilizes motorized chair for mobility assistance.  Assessment/Plan: 1. Onychomycosis   2. Right below-knee amputee (HCC)   3. Diabetic peripheral neuropathy [facility])     Patient was evaluated and treated. All  patient's and/or POA's questions/concerns addressed on today's visit. Toenails 1-5 left foot debrided in length and girth without incident. Continue soft, supportive shoe gear daily. Report any pedal injuries to medical professional. Call office if there are any questions/concerns. -Continue foot and shoe inspections daily. Monitor blood glucose per PCP/Endocrinologist's recommendations. -Patient/POA to call should there be question/concern in the interim.   Return in about 3 months (around 05/17/2023).  Freddie Breech, DPM

## 2023-02-19 ENCOUNTER — Encounter: Payer: Self-pay | Admitting: Podiatry

## 2023-02-19 DIAGNOSIS — I10 Essential (primary) hypertension: Secondary | ICD-10-CM | POA: Diagnosis not present

## 2023-02-19 DIAGNOSIS — E1169 Type 2 diabetes mellitus with other specified complication: Secondary | ICD-10-CM | POA: Diagnosis not present

## 2023-02-21 DIAGNOSIS — I129 Hypertensive chronic kidney disease with stage 1 through stage 4 chronic kidney disease, or unspecified chronic kidney disease: Secondary | ICD-10-CM | POA: Diagnosis not present

## 2023-02-21 DIAGNOSIS — D631 Anemia in chronic kidney disease: Secondary | ICD-10-CM | POA: Diagnosis not present

## 2023-02-21 DIAGNOSIS — R809 Proteinuria, unspecified: Secondary | ICD-10-CM | POA: Diagnosis not present

## 2023-02-21 DIAGNOSIS — N1832 Chronic kidney disease, stage 3b: Secondary | ICD-10-CM | POA: Diagnosis not present

## 2023-02-26 DIAGNOSIS — E1169 Type 2 diabetes mellitus with other specified complication: Secondary | ICD-10-CM | POA: Diagnosis not present

## 2023-02-26 DIAGNOSIS — I10 Essential (primary) hypertension: Secondary | ICD-10-CM | POA: Diagnosis not present

## 2023-02-26 DIAGNOSIS — E559 Vitamin D deficiency, unspecified: Secondary | ICD-10-CM | POA: Diagnosis not present

## 2023-02-26 DIAGNOSIS — R718 Other abnormality of red blood cells: Secondary | ICD-10-CM | POA: Diagnosis not present

## 2023-02-26 DIAGNOSIS — S88119A Complete traumatic amputation at level between knee and ankle, unspecified lower leg, initial encounter: Secondary | ICD-10-CM | POA: Diagnosis not present

## 2023-03-02 DIAGNOSIS — E113513 Type 2 diabetes mellitus with proliferative diabetic retinopathy with macular edema, bilateral: Secondary | ICD-10-CM | POA: Diagnosis not present

## 2023-03-05 DIAGNOSIS — E1165 Type 2 diabetes mellitus with hyperglycemia: Secondary | ICD-10-CM | POA: Diagnosis not present

## 2023-03-15 DIAGNOSIS — I10 Essential (primary) hypertension: Secondary | ICD-10-CM | POA: Diagnosis not present

## 2023-03-15 DIAGNOSIS — E1169 Type 2 diabetes mellitus with other specified complication: Secondary | ICD-10-CM | POA: Diagnosis not present

## 2023-03-15 DIAGNOSIS — E7800 Pure hypercholesterolemia, unspecified: Secondary | ICD-10-CM | POA: Diagnosis not present

## 2023-03-20 DIAGNOSIS — E559 Vitamin D deficiency, unspecified: Secondary | ICD-10-CM | POA: Diagnosis not present

## 2023-03-20 DIAGNOSIS — E1169 Type 2 diabetes mellitus with other specified complication: Secondary | ICD-10-CM | POA: Diagnosis not present

## 2023-03-20 DIAGNOSIS — R718 Other abnormality of red blood cells: Secondary | ICD-10-CM | POA: Diagnosis not present

## 2023-03-20 DIAGNOSIS — S88119A Complete traumatic amputation at level between knee and ankle, unspecified lower leg, initial encounter: Secondary | ICD-10-CM | POA: Diagnosis not present

## 2023-03-20 DIAGNOSIS — I10 Essential (primary) hypertension: Secondary | ICD-10-CM | POA: Diagnosis not present

## 2023-03-22 DIAGNOSIS — E1129 Type 2 diabetes mellitus with other diabetic kidney complication: Secondary | ICD-10-CM | POA: Diagnosis not present

## 2023-03-22 DIAGNOSIS — E559 Vitamin D deficiency, unspecified: Secondary | ICD-10-CM | POA: Diagnosis not present

## 2023-03-22 DIAGNOSIS — E1122 Type 2 diabetes mellitus with diabetic chronic kidney disease: Secondary | ICD-10-CM | POA: Diagnosis not present

## 2023-03-22 DIAGNOSIS — R809 Proteinuria, unspecified: Secondary | ICD-10-CM | POA: Diagnosis not present

## 2023-03-30 DIAGNOSIS — E113513 Type 2 diabetes mellitus with proliferative diabetic retinopathy with macular edema, bilateral: Secondary | ICD-10-CM | POA: Diagnosis not present

## 2023-04-05 DIAGNOSIS — E1165 Type 2 diabetes mellitus with hyperglycemia: Secondary | ICD-10-CM | POA: Diagnosis not present

## 2023-04-06 DIAGNOSIS — S88119A Complete traumatic amputation at level between knee and ankle, unspecified lower leg, initial encounter: Secondary | ICD-10-CM | POA: Diagnosis not present

## 2023-04-06 DIAGNOSIS — E559 Vitamin D deficiency, unspecified: Secondary | ICD-10-CM | POA: Diagnosis not present

## 2023-04-06 DIAGNOSIS — E1169 Type 2 diabetes mellitus with other specified complication: Secondary | ICD-10-CM | POA: Diagnosis not present

## 2023-04-06 DIAGNOSIS — R718 Other abnormality of red blood cells: Secondary | ICD-10-CM | POA: Diagnosis not present

## 2023-04-06 DIAGNOSIS — I10 Essential (primary) hypertension: Secondary | ICD-10-CM | POA: Diagnosis not present

## 2023-04-12 ENCOUNTER — Telehealth: Payer: Self-pay | Admitting: Nurse Practitioner

## 2023-04-12 NOTE — Telephone Encounter (Signed)
Pt was requesting a refill but then after looking in his chart it was a medication another provider had prescribed. Pt will contact PCP to refill.

## 2023-04-12 NOTE — Telephone Encounter (Signed)
Pt wants to change pharmacy [facility]

## 2023-04-12 NOTE — Telephone Encounter (Signed)
Changed pharmacy in chart.

## 2023-04-12 NOTE — Telephone Encounter (Signed)
Pt would like refill of     sent to [facility].

## 2023-04-13 ENCOUNTER — Other Ambulatory Visit: Payer: Self-pay | Admitting: *Deleted

## 2023-04-13 DIAGNOSIS — E119 Type 2 diabetes mellitus without complications: Secondary | ICD-10-CM

## 2023-04-13 MED ORDER — DAPAGLIFLOZIN PROPANEDIOL 10 MG PO TABS: 10.0000 mg | ORAL_TABLET | Freq: Every day | ORAL | 3 refills | Status: DC

## 2023-04-13 MED ORDER — METFORMIN HCL ER 500 MG PO TB24
1000.0000 mg | ORAL_TABLET | Freq: Every day | ORAL | 3 refills | Status: DC
Start: 2023-04-13 — End: 2023-11-15

## 2023-04-13 MED ORDER — GLIPIZIDE ER 5 MG PO TB24: 5.0000 mg | ORAL_TABLET | Freq: Every day | ORAL | 3 refills | Status: DC

## 2023-04-13 MED ORDER — INSULIN GLARGINE 100 UNIT/ML ~~LOC~~ SOLN: 30.0000 [IU] | Freq: Every day | SUBCUTANEOUS | 3 refills | Status: DC

## 2023-04-13 NOTE — Telephone Encounter (Signed)
Patient is asking that his diabetic medications be sent to [facility], he is no longer using [facility].

## 2023-04-24 ENCOUNTER — Telehealth: Payer: Self-pay | Admitting: Nurse Practitioner

## 2023-04-24 MED ORDER — INSULIN GLARGINE 100 UNIT/ML SOLOSTAR PEN: 30.0000 [IU] | PEN_INJECTOR | Freq: Every day | SUBCUTANEOUS | 0 refills | Status: DC

## 2023-04-24 NOTE — Telephone Encounter (Signed)
Spoke with pt. He does have vials but uses the pen. He is not completely out.  The pharmacy said to go ahead send in for Lantus solostar pen.

## 2023-04-24 NOTE — Telephone Encounter (Signed)
Left msg for pt to call back

## 2023-04-24 NOTE — Telephone Encounter (Signed)
Patient said he received his Lantus in Vials and he needs Pens. Please Advise

## 2023-04-25 ENCOUNTER — Other Ambulatory Visit: Payer: Self-pay

## 2023-04-25 MED ORDER — INSULIN GLARGINE 100 UNIT/ML SOLOSTAR PEN: 30.0000 [IU] | PEN_INJECTOR | Freq: Every day | SUBCUTANEOUS | 0 refills | Status: DC

## 2023-04-27 DIAGNOSIS — E113513 Type 2 diabetes mellitus with proliferative diabetic retinopathy with macular edema, bilateral: Secondary | ICD-10-CM | POA: Diagnosis not present

## 2023-05-06 DIAGNOSIS — E1165 Type 2 diabetes mellitus with hyperglycemia: Secondary | ICD-10-CM | POA: Diagnosis not present

## 2023-05-07 ENCOUNTER — Encounter: Payer: Self-pay | Admitting: Nurse Practitioner

## 2023-05-07 ENCOUNTER — Ambulatory Visit (INDEPENDENT_AMBULATORY_CARE_PROVIDER_SITE_OTHER): Payer: 59 | Admitting: Nurse Practitioner

## 2023-05-07 VITALS — BP 140/78 | HR 88 | Ht 66.0 in | Wt 192.6 lb

## 2023-05-07 DIAGNOSIS — Z7984 Long term (current) use of oral hypoglycemic drugs: Secondary | ICD-10-CM | POA: Diagnosis not present

## 2023-05-07 DIAGNOSIS — Z794 Long term (current) use of insulin: Secondary | ICD-10-CM

## 2023-05-07 DIAGNOSIS — E119 Type 2 diabetes mellitus without complications: Secondary | ICD-10-CM | POA: Diagnosis not present

## 2023-05-07 DIAGNOSIS — I10 Essential (primary) hypertension: Secondary | ICD-10-CM

## 2023-05-07 DIAGNOSIS — E559 Vitamin D deficiency, unspecified: Secondary | ICD-10-CM

## 2023-05-07 DIAGNOSIS — E782 Mixed hyperlipidemia: Secondary | ICD-10-CM | POA: Diagnosis not present

## 2023-05-07 LAB — POCT GLYCOSYLATED HEMOGLOBIN (HGB A1C): Hemoglobin A1C: 8.7 % — AB (ref 4.0–5.6)

## 2023-05-07 MED ORDER — INSULIN GLARGINE 100 UNIT/ML ~~LOC~~ SOLN
40.0000 [IU] | Freq: Every day | SUBCUTANEOUS | 3 refills | Status: DC
Start: 2023-05-07 — End: 2023-05-29
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
## 2023-05-22 ENCOUNTER — Ambulatory Visit: Payer: 59 | Admitting: Podiatry

## 2023-05-29 ENCOUNTER — Other Ambulatory Visit: Payer: Self-pay

## 2023-05-29 DIAGNOSIS — E119 Type 2 diabetes mellitus without complications: Secondary | ICD-10-CM

## 2023-05-29 MED ORDER — INSULIN GLARGINE 100 UNIT/ML ~~LOC~~ SOLN: 40.0000 [IU] | Freq: Every day | SUBCUTANEOUS | 0 refills | Status: DC

## 2023-05-30 ENCOUNTER — Other Ambulatory Visit: Payer: Self-pay

## 2023-05-30 MED ORDER — LANTUS SOLOSTAR 100 UNIT/ML ~~LOC~~ SOPN: 40.0000 [IU] | PEN_INJECTOR | Freq: Every day | SUBCUTANEOUS | 0 refills | Status: DC

## 2023-06-01 DIAGNOSIS — I10 Essential (primary) hypertension: Secondary | ICD-10-CM | POA: Diagnosis not present

## 2023-06-01 DIAGNOSIS — E1169 Type 2 diabetes mellitus with other specified complication: Secondary | ICD-10-CM | POA: Diagnosis not present

## 2023-06-05 DIAGNOSIS — E1165 Type 2 diabetes mellitus with hyperglycemia: Secondary | ICD-10-CM | POA: Diagnosis not present

## 2023-06-08 DIAGNOSIS — Z23 Encounter for immunization: Secondary | ICD-10-CM | POA: Diagnosis not present

## 2023-06-08 DIAGNOSIS — R718 Other abnormality of red blood cells: Secondary | ICD-10-CM | POA: Diagnosis not present

## 2023-06-08 DIAGNOSIS — E1169 Type 2 diabetes mellitus with other specified complication: Secondary | ICD-10-CM | POA: Diagnosis not present

## 2023-06-08 DIAGNOSIS — S88119A Complete traumatic amputation at level between knee and ankle, unspecified lower leg, initial encounter: Secondary | ICD-10-CM | POA: Diagnosis not present

## 2023-06-08 DIAGNOSIS — Z1389 Encounter for screening for other disorder: Secondary | ICD-10-CM | POA: Diagnosis not present

## 2023-06-08 DIAGNOSIS — I10 Essential (primary) hypertension: Secondary | ICD-10-CM | POA: Diagnosis not present

## 2023-06-08 DIAGNOSIS — E559 Vitamin D deficiency, unspecified: Secondary | ICD-10-CM | POA: Diagnosis not present

## 2023-06-20 DIAGNOSIS — E559 Vitamin D deficiency, unspecified: Secondary | ICD-10-CM | POA: Diagnosis not present

## 2023-06-20 DIAGNOSIS — E1122 Type 2 diabetes mellitus with diabetic chronic kidney disease: Secondary | ICD-10-CM | POA: Diagnosis not present

## 2023-06-20 DIAGNOSIS — E1129 Type 2 diabetes mellitus with other diabetic kidney complication: Secondary | ICD-10-CM | POA: Diagnosis not present

## 2023-06-20 DIAGNOSIS — N189 Chronic kidney disease, unspecified: Secondary | ICD-10-CM | POA: Diagnosis not present

## 2023-06-21 DIAGNOSIS — E113413 Type 2 diabetes mellitus with severe nonproliferative diabetic retinopathy with macular edema, bilateral: Secondary | ICD-10-CM | POA: Diagnosis not present

## 2023-06-21 DIAGNOSIS — Z794 Long term (current) use of insulin: Secondary | ICD-10-CM | POA: Diagnosis not present

## 2023-06-21 DIAGNOSIS — Z7984 Long term (current) use of oral hypoglycemic drugs: Secondary | ICD-10-CM | POA: Diagnosis not present

## 2023-06-21 DIAGNOSIS — H52223 Regular astigmatism, bilateral: Secondary | ICD-10-CM | POA: Diagnosis not present

## 2023-06-21 DIAGNOSIS — H2513 Age-related nuclear cataract, bilateral: Secondary | ICD-10-CM | POA: Diagnosis not present

## 2023-06-28 DIAGNOSIS — R809 Proteinuria, unspecified: Secondary | ICD-10-CM | POA: Diagnosis not present

## 2023-06-28 DIAGNOSIS — E1142 Type 2 diabetes mellitus with diabetic polyneuropathy: Secondary | ICD-10-CM | POA: Diagnosis not present

## 2023-06-28 DIAGNOSIS — N1831 Chronic kidney disease, stage 3a: Secondary | ICD-10-CM | POA: Diagnosis not present

## 2023-06-28 DIAGNOSIS — E0822 Diabetes mellitus due to underlying condition with diabetic chronic kidney disease: Secondary | ICD-10-CM | POA: Diagnosis not present

## 2023-07-05 DIAGNOSIS — Z794 Long term (current) use of insulin: Secondary | ICD-10-CM | POA: Diagnosis not present

## 2023-07-05 DIAGNOSIS — E1169 Type 2 diabetes mellitus with other specified complication: Secondary | ICD-10-CM | POA: Diagnosis not present

## 2023-07-06 DIAGNOSIS — E1165 Type 2 diabetes mellitus with hyperglycemia: Secondary | ICD-10-CM | POA: Diagnosis not present

## 2023-07-27 DIAGNOSIS — E113513 Type 2 diabetes mellitus with proliferative diabetic retinopathy with macular edema, bilateral: Secondary | ICD-10-CM | POA: Diagnosis not present

## 2023-07-30 DIAGNOSIS — E782 Mixed hyperlipidemia: Secondary | ICD-10-CM | POA: Diagnosis not present

## 2023-07-30 DIAGNOSIS — Z794 Long term (current) use of insulin: Secondary | ICD-10-CM | POA: Diagnosis not present

## 2023-07-30 DIAGNOSIS — E119 Type 2 diabetes mellitus without complications: Secondary | ICD-10-CM | POA: Diagnosis not present

## 2023-07-31 LAB — LIPID PANEL
Chol/HDL Ratio: 5.4 {ratio} — ABNORMAL HIGH (ref 0.0–5.0)
Cholesterol, Total: 157 mg/dL (ref 100–199)
HDL: 29 mg/dL — ABNORMAL LOW (ref >39)
LDL Chol Calc (NIH): 75 mg/dL (ref 0–99)
Triglycerides: 329 mg/dL — ABNORMAL HIGH (ref 0–149)
VLDL Cholesterol Cal: 53 mg/dL — ABNORMAL HIGH (ref 5–40)

## 2023-07-31 LAB — T4, FREE: Free T4: 1.28 ng/dL (ref 0.82–1.77)

## 2023-07-31 LAB — TSH: TSH: 2.36 u[IU]/mL (ref 0.450–4.500)

## 2023-08-05 DIAGNOSIS — E1165 Type 2 diabetes mellitus with hyperglycemia: Secondary | ICD-10-CM | POA: Diagnosis not present

## 2023-08-06 ENCOUNTER — Other Ambulatory Visit: Payer: Self-pay | Admitting: Nurse Practitioner

## 2023-08-06 ENCOUNTER — Ambulatory Visit (INDEPENDENT_AMBULATORY_CARE_PROVIDER_SITE_OTHER): Payer: 59 | Admitting: Nurse Practitioner

## 2023-08-06 ENCOUNTER — Encounter: Payer: Self-pay | Admitting: Nurse Practitioner

## 2023-08-06 VITALS — BP 154/96 | HR 75 | Ht 66.0 in | Wt 197.4 lb

## 2023-08-06 DIAGNOSIS — Z7984 Long term (current) use of oral hypoglycemic drugs: Secondary | ICD-10-CM | POA: Diagnosis not present

## 2023-08-06 DIAGNOSIS — E782 Mixed hyperlipidemia: Secondary | ICD-10-CM

## 2023-08-06 DIAGNOSIS — E119 Type 2 diabetes mellitus without complications: Secondary | ICD-10-CM

## 2023-08-06 DIAGNOSIS — E559 Vitamin D deficiency, unspecified: Secondary | ICD-10-CM | POA: Diagnosis not present

## 2023-08-06 DIAGNOSIS — I10 Essential (primary) hypertension: Secondary | ICD-10-CM

## 2023-08-06 DIAGNOSIS — Z794 Long term (current) use of insulin: Secondary | ICD-10-CM

## 2023-08-06 LAB — POCT GLYCOSYLATED HEMOGLOBIN (HGB A1C): Hemoglobin A1C: 8 % — AB (ref 4.0–5.6)

## 2023-08-06 MED ORDER — LANTUS SOLOSTAR 100 UNIT/ML ~~LOC~~ SOPN: 42.0000 [IU] | PEN_INJECTOR | Freq: Every day | SUBCUTANEOUS | 3 refills | Status: DC

## 2023-08-06 MED ORDER — DEXCOM G7 SENSOR MISC: 1.0000 | 3 refills | Status: DC
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
## 2023-08-16 DIAGNOSIS — E559 Vitamin D deficiency, unspecified: Secondary | ICD-10-CM | POA: Diagnosis not present

## 2023-08-16 DIAGNOSIS — E039 Hypothyroidism, unspecified: Secondary | ICD-10-CM | POA: Diagnosis not present

## 2023-08-16 DIAGNOSIS — E1169 Type 2 diabetes mellitus with other specified complication: Secondary | ICD-10-CM | POA: Diagnosis not present

## 2023-08-23 IMAGING — US US RENAL
1 series · 14 of 25 positions shown · non-contrast
Comparison: None Available.

CLINICAL DATA: Chronic kidney disease.

EXAM:
RENAL / URINARY TRACT ULTRASOUND COMPLETE

[Series 1: us renal · 14 of 42 slices shown]
[im 1/42]
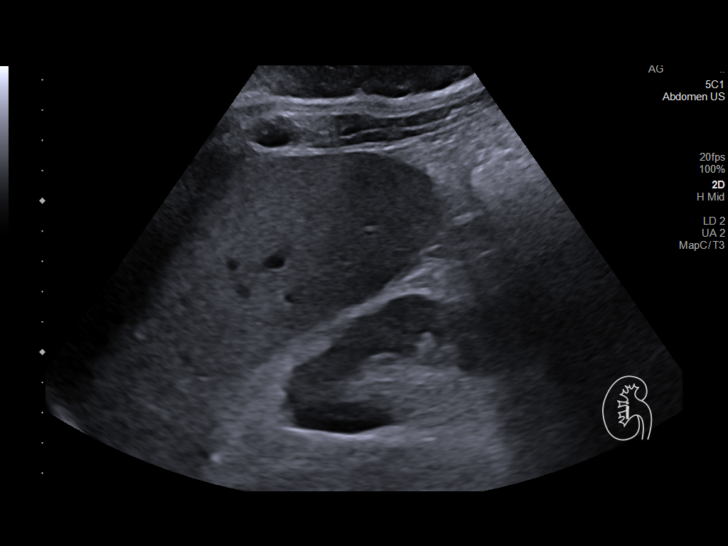
[im 4/42]
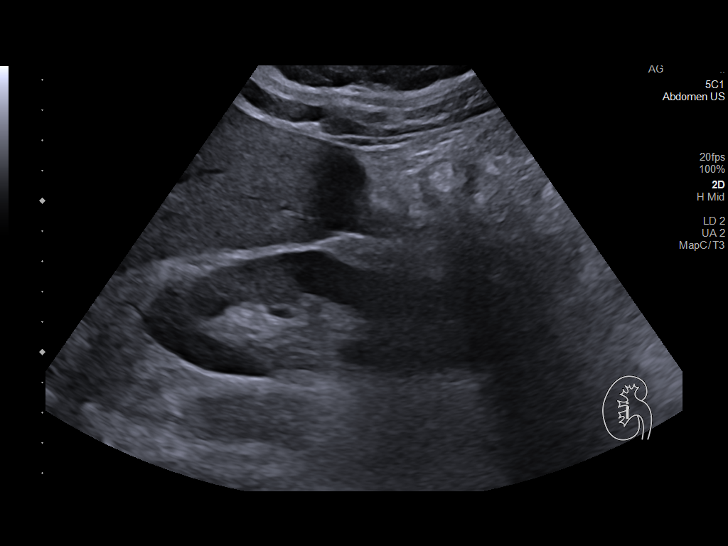
[im 7/42]
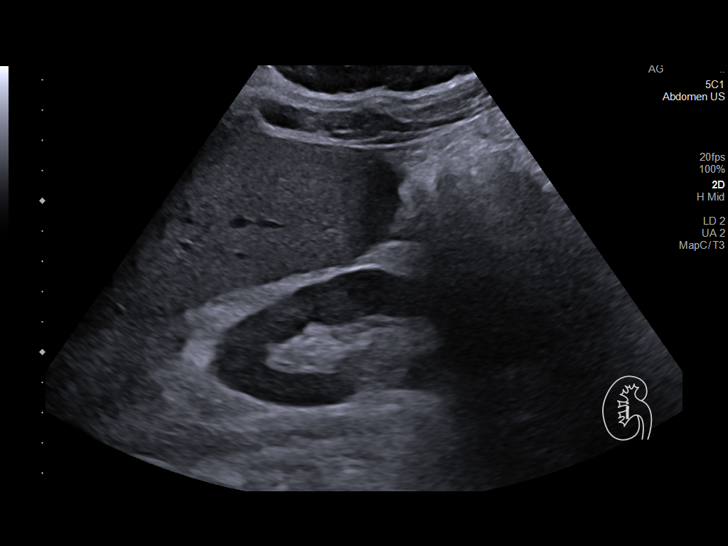
[im 11/42]
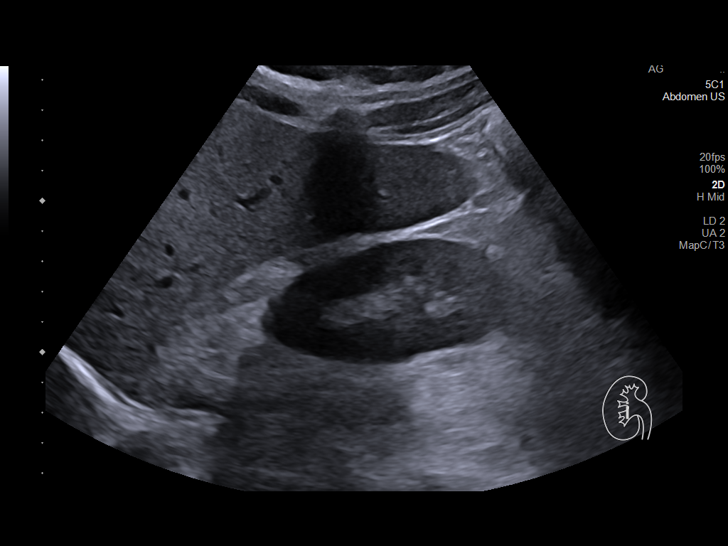
[im 14/42]
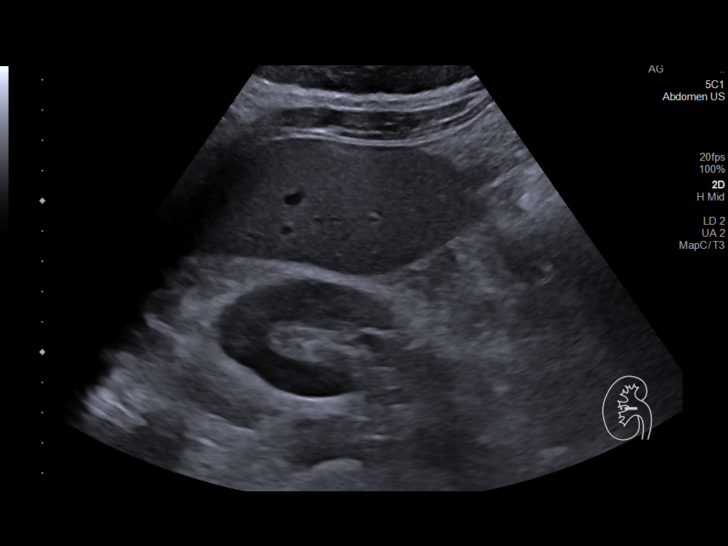
[im 16/42]
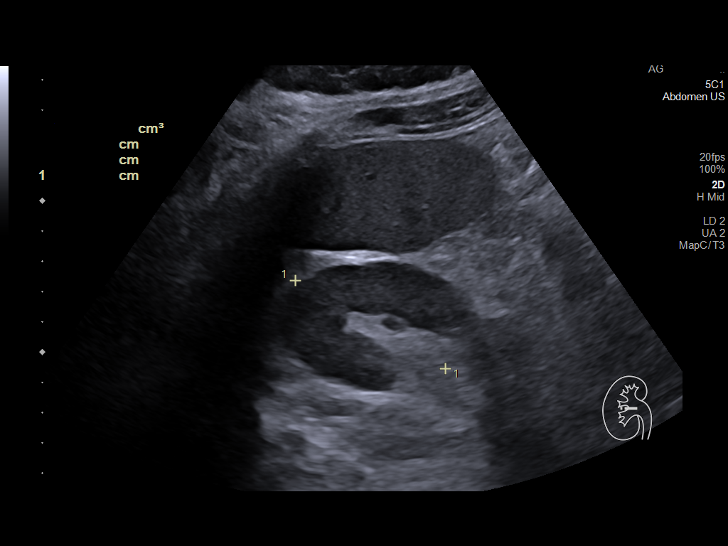
[im 19/42]
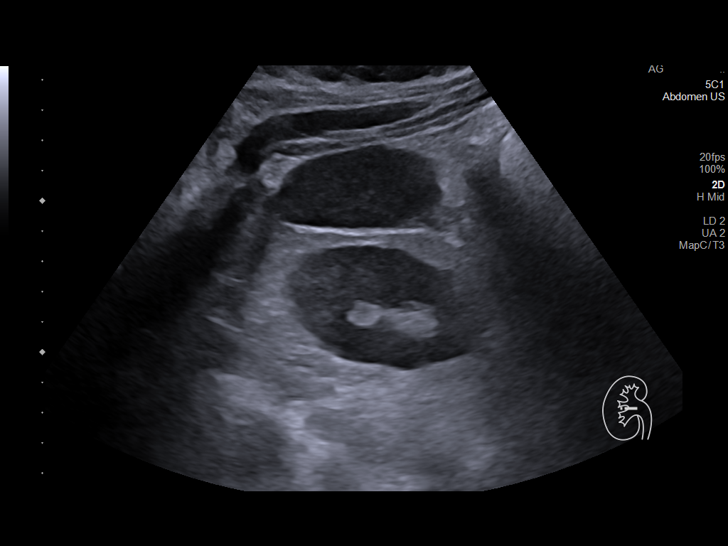
[im 23/42]
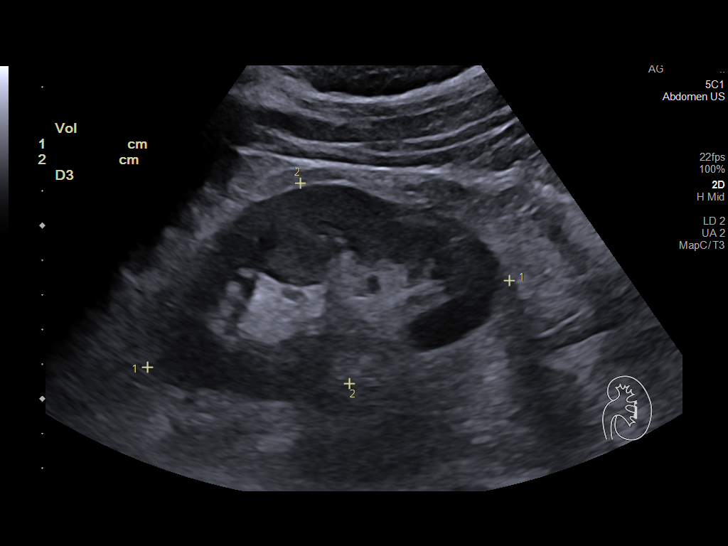
[im 26/42]
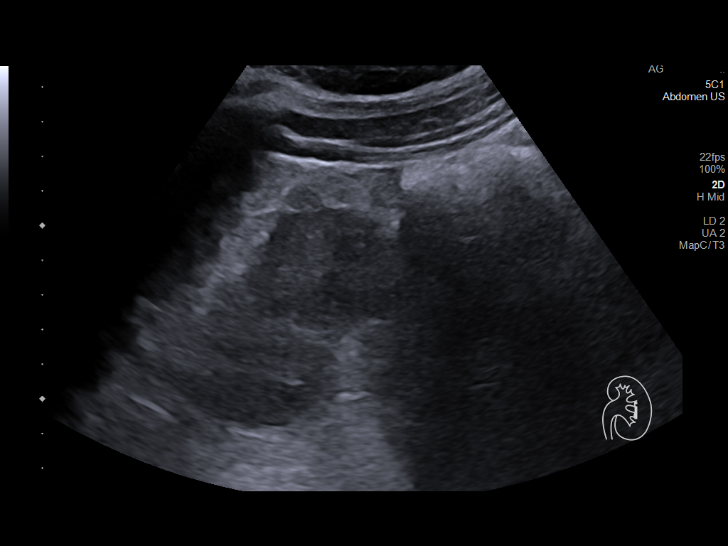
[im 28/42]
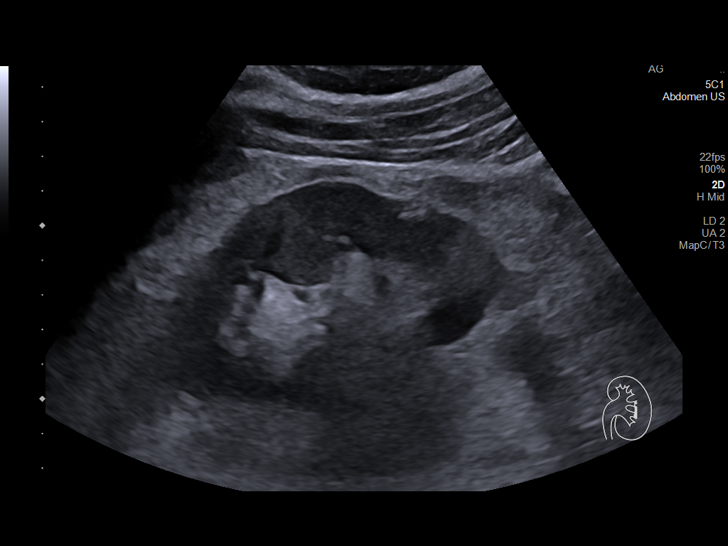
[im 31/42]
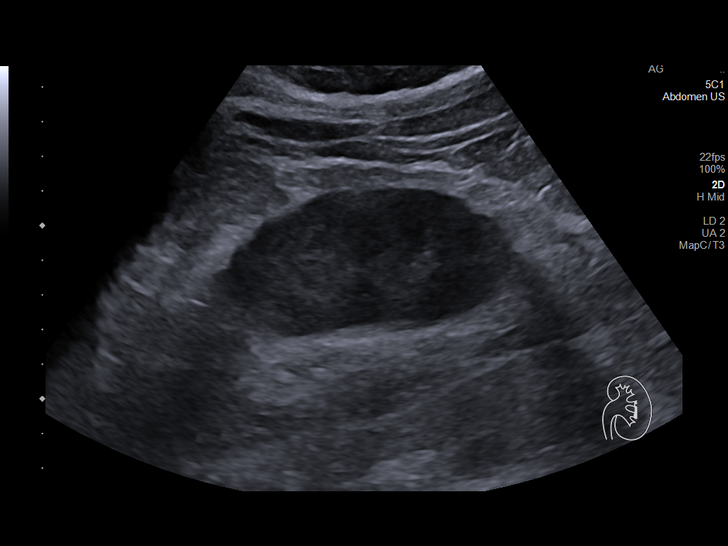
[im 35/42]
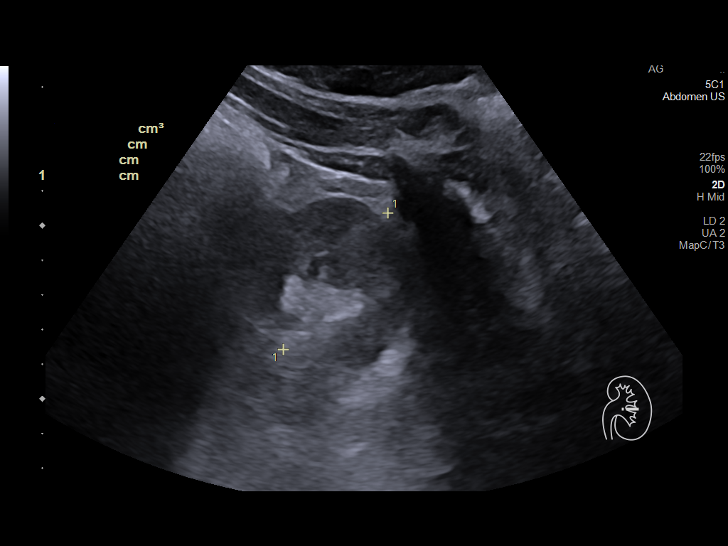
[im 38/42]
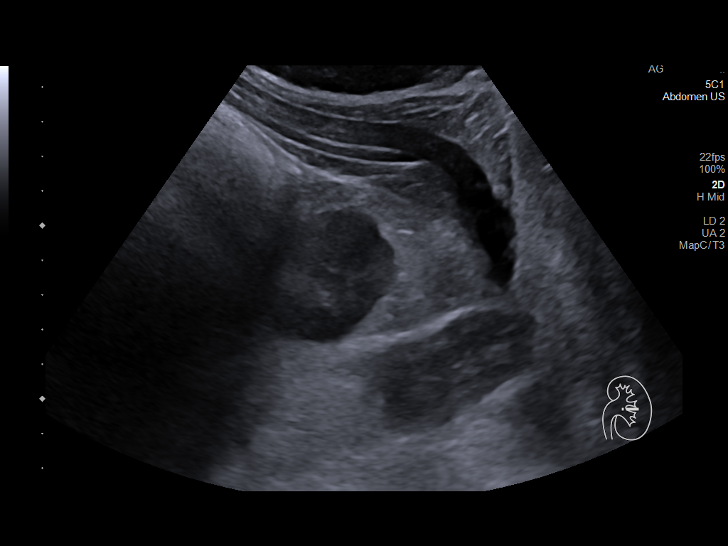
[im 42/42]
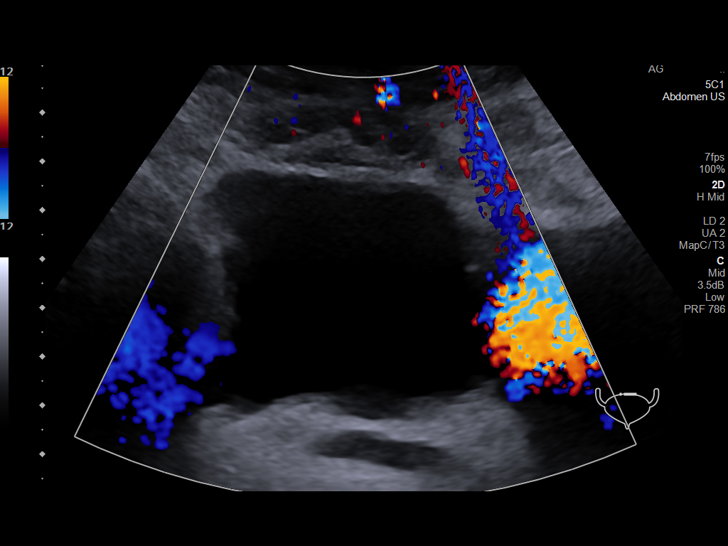

[14 of 25 positions shown; findings below may reference images not displayed]

FINDINGS: Right Kidney:

Renal measurements: 9.8 x 4.4 x 5.8 cm = volume: 130 mL.
Echogenicity within normal limits. No mass or hydronephrosis
visualized.

Left Kidney:

Renal measurements: 10.7 x 6.0 x 5.0 cm = volume: 166 mL.
Echogenicity within normal limits. No mass or hydronephrosis
visualized.

Bladder:

Appears normal for degree of bladder distention.

Other:

None.
IMPRESSION: No abnormalities are identified. The kidneys and bladder are
unremarkable.

## 2023-09-05 ENCOUNTER — Ambulatory Visit (INDEPENDENT_AMBULATORY_CARE_PROVIDER_SITE_OTHER): Payer: 59 | Admitting: Podiatry

## 2023-09-05 DIAGNOSIS — E1165 Type 2 diabetes mellitus with hyperglycemia: Secondary | ICD-10-CM | POA: Diagnosis not present

## 2023-09-05 DIAGNOSIS — Z91198 Patient's noncompliance with other medical treatment and regimen for other reason: Secondary | ICD-10-CM

## 2023-09-06 NOTE — Progress Notes (Signed)
1. Failure to attend appointment with reason given    Rescheduled.

## 2023-09-12 DIAGNOSIS — I10 Essential (primary) hypertension: Secondary | ICD-10-CM | POA: Diagnosis not present

## 2023-09-12 DIAGNOSIS — R718 Other abnormality of red blood cells: Secondary | ICD-10-CM | POA: Diagnosis not present

## 2023-09-12 DIAGNOSIS — E559 Vitamin D deficiency, unspecified: Secondary | ICD-10-CM | POA: Diagnosis not present

## 2023-09-12 DIAGNOSIS — E7800 Pure hypercholesterolemia, unspecified: Secondary | ICD-10-CM | POA: Diagnosis not present

## 2023-09-25 DIAGNOSIS — E161 Other hypoglycemia: Secondary | ICD-10-CM | POA: Diagnosis not present

## 2023-09-25 DIAGNOSIS — I10 Essential (primary) hypertension: Secondary | ICD-10-CM | POA: Diagnosis not present

## 2023-09-26 ENCOUNTER — Other Ambulatory Visit: Payer: Self-pay

## 2023-09-26 ENCOUNTER — Emergency Department (HOSPITAL_COMMUNITY)
Admission: EM | Admit: 2023-09-26 | Discharge: 2023-09-26 | Disposition: A | Discharge status: Home / Self Care | Payer: 59 | Attending: Emergency Medicine | Admitting: Emergency Medicine

## 2023-09-26 ENCOUNTER — Emergency Department (HOSPITAL_COMMUNITY): Payer: 59

## 2023-09-26 ENCOUNTER — Encounter (HOSPITAL_COMMUNITY): Payer: Self-pay

## 2023-09-26 DIAGNOSIS — R718 Other abnormality of red blood cells: Secondary | ICD-10-CM | POA: Diagnosis not present

## 2023-09-26 DIAGNOSIS — E11649 Type 2 diabetes mellitus with hypoglycemia without coma: Secondary | ICD-10-CM | POA: Insufficient documentation

## 2023-09-26 DIAGNOSIS — I10 Essential (primary) hypertension: Secondary | ICD-10-CM | POA: Diagnosis not present

## 2023-09-26 DIAGNOSIS — I672 Cerebral atherosclerosis: Secondary | ICD-10-CM | POA: Insufficient documentation

## 2023-09-26 DIAGNOSIS — R4182 Altered mental status, unspecified: Secondary | ICD-10-CM | POA: Diagnosis present

## 2023-09-26 DIAGNOSIS — Z7984 Long term (current) use of oral hypoglycemic drugs: Secondary | ICD-10-CM | POA: Diagnosis not present

## 2023-09-26 DIAGNOSIS — Z794 Long term (current) use of insulin: Secondary | ICD-10-CM | POA: Diagnosis not present

## 2023-09-26 DIAGNOSIS — Z87820 Personal history of traumatic brain injury: Secondary | ICD-10-CM | POA: Diagnosis not present

## 2023-09-26 DIAGNOSIS — E559 Vitamin D deficiency, unspecified: Secondary | ICD-10-CM | POA: Diagnosis not present

## 2023-09-26 DIAGNOSIS — E7800 Pure hypercholesterolemia, unspecified: Secondary | ICD-10-CM | POA: Diagnosis not present

## 2023-09-26 LAB — CBC WITH DIFFERENTIAL/PLATELET
Abs Immature Granulocytes: 0.05 10*3/uL (ref 0.00–0.07)
Basophils Absolute: 0 10*3/uL (ref 0.0–0.1)
Basophils Relative: 0 %
Eosinophils Absolute: 0.1 10*3/uL (ref 0.0–0.5)
Eosinophils Relative: 2 %
HCT: 42.5 % (ref 39.0–52.0)
Hemoglobin: 13.5 g/dL (ref 13.0–17.0)
Immature Granulocytes: 1 %
Lymphocytes Relative: 34 %
Lymphs Abs: 2.1 10*3/uL (ref 0.7–4.0)
MCH: 26.7 pg (ref 26.0–34.0)
MCHC: 31.8 g/dL (ref 30.0–36.0)
MCV: 84.2 fL (ref 80.0–100.0)
Monocytes Absolute: 0.6 10*3/uL (ref 0.1–1.0)
Monocytes Relative: 9 %
Neutro Abs: 3.4 10*3/uL (ref 1.7–7.7)
Neutrophils Relative %: 54 %
Platelets: 347 10*3/uL (ref 150–400)
RBC: 5.05 MIL/uL (ref 4.22–5.81)
RDW: 15.6 % — ABNORMAL HIGH (ref 11.5–15.5)
WBC: 6.2 10*3/uL (ref 4.0–10.5)
nRBC: 0 % (ref 0.0–0.2)

## 2023-09-26 LAB — COMPREHENSIVE METABOLIC PANEL
ALT: 129 U/L — ABNORMAL HIGH (ref 0–44)
AST: 73 U/L — ABNORMAL HIGH (ref 15–41)
Albumin: 3.2 g/dL — ABNORMAL LOW (ref 3.5–5.0)
Alkaline Phosphatase: 60 U/L (ref 38–126)
Anion gap: 9 (ref 5–15)
BUN: 9 mg/dL (ref 6–20)
CO2: 25 mmol/L (ref 22–32)
Calcium: 8.6 mg/dL — ABNORMAL LOW (ref 8.9–10.3)
Chloride: 109 mmol/L (ref 98–111)
Creatinine, Ser: 1.32 mg/dL — ABNORMAL HIGH (ref 0.61–1.24)
GFR, Estimated: >60 mL/min (ref >60)
Glucose, Bld: 123 mg/dL — ABNORMAL HIGH (ref 70–99)
Potassium: 3.6 mmol/L (ref 3.5–5.1)
Sodium: 143 mmol/L (ref 135–145)
Total Bilirubin: 0.3 mg/dL (ref 0.0–1.2)
Total Protein: 7 g/dL (ref 6.5–8.1)

## 2023-09-26 LAB — URINALYSIS, ROUTINE W REFLEX MICROSCOPIC
Bacteria, UA: NONE SEEN
Bilirubin Urine: NEGATIVE
Glucose, UA: >=500 mg/dL — AB
Ketones, ur: NEGATIVE mg/dL
Leukocytes,Ua: NEGATIVE
Nitrite: NEGATIVE
Protein, ur: >=300 mg/dL — AB
Specific Gravity, Urine: 1.017 (ref 1.005–1.030)
pH: 5 (ref 5.0–8.0)

## 2023-09-26 LAB — RESP PANEL BY RT-PCR (RSV, FLU A&B, COVID)  RVPGX2
Influenza A by PCR: NEGATIVE
Influenza B by PCR: NEGATIVE
Resp Syncytial Virus by PCR: NEGATIVE
SARS Coronavirus 2 by RT PCR: NEGATIVE

## 2023-09-26 LAB — CBG MONITORING, ED: Glucose-Capillary: 99 mg/dL (ref 70–99)
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
## 2023-09-26 NOTE — ED Triage Notes (Signed)
Pt arrived via POV from home reporting being sent to ED for evaluation. Per family in Triage, Pt has been confused and having memory problems. Pt reports his doctor wanted his kidney function checked and his blood sugar.

## 2023-09-26 NOTE — ED Notes (Addendum)
Open note on wrong person

## 2023-09-26 NOTE — Discharge Instructions (Addendum)
Please follow-up with your primary care provider in regards to recent ER visit.  Please follow-up on the MyChart app in regards to your respiratory panel results.  Today your labs imaging are reassuring and your altered mental state may be related to your low sugar.  Please take your insulin as prescribed as symptoms change or worsen please return to the ER.
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
## 2023-10-03 DIAGNOSIS — R718 Other abnormality of red blood cells: Secondary | ICD-10-CM | POA: Diagnosis not present

## 2023-10-03 DIAGNOSIS — E559 Vitamin D deficiency, unspecified: Secondary | ICD-10-CM | POA: Diagnosis not present

## 2023-10-03 DIAGNOSIS — I10 Essential (primary) hypertension: Secondary | ICD-10-CM | POA: Diagnosis not present

## 2023-10-03 DIAGNOSIS — E7800 Pure hypercholesterolemia, unspecified: Secondary | ICD-10-CM | POA: Diagnosis not present

## 2023-10-05 ENCOUNTER — Telehealth: Payer: Self-pay | Admitting: Nurse Practitioner

## 2023-10-05 NOTE — Telephone Encounter (Signed)
Faxed over pt's last office visit note.

## 2023-10-05 NOTE — Telephone Encounter (Signed)
Pt said that [employer] sent a text requesting his last date of service. Please Advise. Sheet in box

## 2023-10-06 DIAGNOSIS — E1165 Type 2 diabetes mellitus with hyperglycemia: Secondary | ICD-10-CM | POA: Diagnosis not present

## 2023-10-16 DIAGNOSIS — E559 Vitamin D deficiency, unspecified: Secondary | ICD-10-CM | POA: Diagnosis not present

## 2023-10-16 DIAGNOSIS — E7800 Pure hypercholesterolemia, unspecified: Secondary | ICD-10-CM | POA: Diagnosis not present

## 2023-10-16 DIAGNOSIS — R718 Other abnormality of red blood cells: Secondary | ICD-10-CM | POA: Diagnosis not present

## 2023-10-16 DIAGNOSIS — I10 Essential (primary) hypertension: Secondary | ICD-10-CM | POA: Diagnosis not present

## 2023-10-24 DIAGNOSIS — D631 Anemia in chronic kidney disease: Secondary | ICD-10-CM | POA: Diagnosis not present

## 2023-10-24 DIAGNOSIS — N189 Chronic kidney disease, unspecified: Secondary | ICD-10-CM | POA: Diagnosis not present

## 2023-10-24 DIAGNOSIS — R809 Proteinuria, unspecified: Secondary | ICD-10-CM | POA: Diagnosis not present

## 2023-10-30 DIAGNOSIS — E7800 Pure hypercholesterolemia, unspecified: Secondary | ICD-10-CM | POA: Diagnosis not present

## 2023-10-30 DIAGNOSIS — R718 Other abnormality of red blood cells: Secondary | ICD-10-CM | POA: Diagnosis not present

## 2023-10-30 DIAGNOSIS — E559 Vitamin D deficiency, unspecified: Secondary | ICD-10-CM | POA: Diagnosis not present

## 2023-10-30 DIAGNOSIS — I10 Essential (primary) hypertension: Secondary | ICD-10-CM | POA: Diagnosis not present

## 2023-11-01 DIAGNOSIS — E1129 Type 2 diabetes mellitus with other diabetic kidney complication: Secondary | ICD-10-CM | POA: Diagnosis not present

## 2023-11-01 DIAGNOSIS — R809 Proteinuria, unspecified: Secondary | ICD-10-CM | POA: Diagnosis not present

## 2023-11-01 DIAGNOSIS — E559 Vitamin D deficiency, unspecified: Secondary | ICD-10-CM | POA: Diagnosis not present

## 2023-11-01 DIAGNOSIS — E1122 Type 2 diabetes mellitus with diabetic chronic kidney disease: Secondary | ICD-10-CM | POA: Diagnosis not present

## 2023-11-03 DIAGNOSIS — E1165 Type 2 diabetes mellitus with hyperglycemia: Secondary | ICD-10-CM | POA: Diagnosis not present

## 2023-11-06 DIAGNOSIS — E162 Hypoglycemia, unspecified: Secondary | ICD-10-CM | POA: Diagnosis not present

## 2023-11-07 ENCOUNTER — Ambulatory Visit (INDEPENDENT_AMBULATORY_CARE_PROVIDER_SITE_OTHER): Payer: 59 | Admitting: Podiatry

## 2023-11-07 ENCOUNTER — Ambulatory Visit: Payer: 59 | Admitting: Nurse Practitioner

## 2023-11-07 ENCOUNTER — Encounter: Payer: Self-pay | Admitting: Podiatry

## 2023-11-07 VITALS — Ht 66.0 in | Wt 197.3 lb

## 2023-11-07 DIAGNOSIS — E119 Type 2 diabetes mellitus without complications: Secondary | ICD-10-CM

## 2023-11-07 DIAGNOSIS — Z89511 Acquired absence of right leg below knee: Secondary | ICD-10-CM

## 2023-11-07 DIAGNOSIS — E1142 Type 2 diabetes mellitus with diabetic polyneuropathy: Secondary | ICD-10-CM | POA: Diagnosis not present

## 2023-11-07 DIAGNOSIS — B351 Tinea unguium: Secondary | ICD-10-CM
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
## 2023-11-08 ENCOUNTER — Telehealth: Payer: Self-pay | Admitting: *Deleted

## 2023-11-08 NOTE — Telephone Encounter (Signed)
 Patient presented to the office sharing that his blood sugars were dropping into the 40's. His CGM was downloaded and the print out was shown to Clinton. She advised that the patient stop his Glipizide. Shared with the patient and he said that he had been without is because he got confused and did not know where he was and another physician took him off it. Whitney ask how much insulin the patient was taking. Patient states that he is injecting 30 units of insulin. Whitney ask that the patient lower the dose to 20 units at night. Shared with the patient and he verbalized back to me understanding. Patient has appointment on 04/03/025.  Note- patient was asking that the Dexcom G7 be changed today, when ask how many days had he had this one on, he replied I have about 5 days or so left on it. Patient told that we would not want to change it now as that would be wasting good days for if to do its job. He understood.

## 2023-11-15 ENCOUNTER — Encounter: Payer: Self-pay | Admitting: Nurse Practitioner

## 2023-11-15 ENCOUNTER — Ambulatory Visit (INDEPENDENT_AMBULATORY_CARE_PROVIDER_SITE_OTHER): Payer: 59 | Admitting: Nurse Practitioner

## 2023-11-15 VITALS — BP 142/84 | HR 78 | Ht 66.0 in | Wt 189.6 lb

## 2023-11-15 DIAGNOSIS — E119 Type 2 diabetes mellitus without complications: Secondary | ICD-10-CM

## 2023-11-15 DIAGNOSIS — E782 Mixed hyperlipidemia: Secondary | ICD-10-CM

## 2023-11-15 DIAGNOSIS — Z7984 Long term (current) use of oral hypoglycemic drugs: Secondary | ICD-10-CM

## 2023-11-15 DIAGNOSIS — Z794 Long term (current) use of insulin: Secondary | ICD-10-CM | POA: Diagnosis not present

## 2023-11-15 DIAGNOSIS — I10 Essential (primary) hypertension: Secondary | ICD-10-CM

## 2023-11-15 DIAGNOSIS — E559 Vitamin D deficiency, unspecified: Secondary | ICD-10-CM | POA: Diagnosis not present

## 2023-11-15 LAB — POCT GLYCOSYLATED HEMOGLOBIN (HGB A1C): Hemoglobin A1C: 8.4 % — AB (ref 4.0–5.6)

## 2023-11-15 MED ORDER — LANTUS SOLOSTAR 100 UNIT/ML ~~LOC~~ SOPN: 25.0000 [IU] | PEN_INJECTOR | Freq: Every day | SUBCUTANEOUS | 3 refills | Status: DC

## 2023-11-15 MED ORDER — DAPAGLIFLOZIN PROPANEDIOL 10 MG PO TABS: 10.0000 mg | ORAL_TABLET | Freq: Every day | ORAL | 3 refills | Status: DC

## 2023-11-15 MED ORDER — METFORMIN HCL ER 500 MG PO TB24: 1000.0000 mg | ORAL_TABLET | Freq: Every day | ORAL | 3 refills | Status: DC
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
## 2023-11-22 DIAGNOSIS — E162 Hypoglycemia, unspecified: Secondary | ICD-10-CM | POA: Diagnosis not present

## 2023-11-22 DIAGNOSIS — E559 Vitamin D deficiency, unspecified: Secondary | ICD-10-CM | POA: Diagnosis not present

## 2023-11-22 DIAGNOSIS — E7800 Pure hypercholesterolemia, unspecified: Secondary | ICD-10-CM | POA: Diagnosis not present

## 2023-11-22 DIAGNOSIS — I10 Essential (primary) hypertension: Secondary | ICD-10-CM | POA: Diagnosis not present

## 2023-12-04 DIAGNOSIS — E1165 Type 2 diabetes mellitus with hyperglycemia: Secondary | ICD-10-CM | POA: Diagnosis not present

## 2023-12-06 DIAGNOSIS — E559 Vitamin D deficiency, unspecified: Secondary | ICD-10-CM | POA: Diagnosis not present

## 2023-12-06 DIAGNOSIS — I10 Essential (primary) hypertension: Secondary | ICD-10-CM | POA: Diagnosis not present

## 2023-12-06 DIAGNOSIS — E162 Hypoglycemia, unspecified: Secondary | ICD-10-CM | POA: Diagnosis not present

## 2023-12-06 DIAGNOSIS — E7800 Pure hypercholesterolemia, unspecified: Secondary | ICD-10-CM | POA: Diagnosis not present

## 2023-12-20 DIAGNOSIS — E1169 Type 2 diabetes mellitus with other specified complication: Secondary | ICD-10-CM | POA: Diagnosis not present

## 2023-12-20 DIAGNOSIS — E559 Vitamin D deficiency, unspecified: Secondary | ICD-10-CM | POA: Diagnosis not present

## 2023-12-20 DIAGNOSIS — I10 Essential (primary) hypertension: Secondary | ICD-10-CM | POA: Diagnosis not present

## 2023-12-20 DIAGNOSIS — E7800 Pure hypercholesterolemia, unspecified: Secondary | ICD-10-CM | POA: Diagnosis not present

## 2023-12-27 ENCOUNTER — Encounter: Attending: Internal Medicine | Admitting: Nutrition

## 2023-12-27 DIAGNOSIS — E119 Type 2 diabetes mellitus without complications: Secondary | ICD-10-CM | POA: Insufficient documentation

## 2023-12-27 NOTE — Progress Notes (Signed)
 Pt came by with a failed sensor.  It was placed on Saturday and failed yesterday. Called Dexcom support with patient in office. Dexcom acquired needed information. New sensor will be sent  in 5-7 days to his home address.. Pt. Was assisted with replacing new sensor. 22 minute warm up. Pt. Verbalized understanding to contact Dexcom if any further problems.  Pt. Denied any abnormal feelings or low blood sugar.

## 2023-12-28 ENCOUNTER — Telehealth: Payer: Self-pay | Admitting: Nurse Practitioner

## 2023-12-28 MED ORDER — ACCU-CHEK GUIDE ME W/DEVICE KIT
PACK | 0 refills | Status: DC
Start: 2023-12-28 — End: 2024-01-15

## 2023-12-28 MED ORDER — ACCU-CHEK SOFTCLIX LANCETS MISC: 12 refills | Status: AC

## 2023-12-28 MED ORDER — ACCU-CHEK GUIDE TEST VI STRP: ORAL_STRIP | 12 refills | Status: AC

## 2023-12-28 NOTE — Telephone Encounter (Signed)
 [facility]  Has had issues with the dexcom sensors.  Asked pt if there was a local pharmacy that he could pick up quick, but he stated no.

## 2023-12-28 NOTE — Telephone Encounter (Signed)
 I sent in a new meter to [facility]

## 2024-01-01 DIAGNOSIS — R809 Proteinuria, unspecified: Secondary | ICD-10-CM | POA: Diagnosis not present

## 2024-01-01 DIAGNOSIS — N189 Chronic kidney disease, unspecified: Secondary | ICD-10-CM | POA: Diagnosis not present

## 2024-01-01 DIAGNOSIS — E211 Secondary hyperparathyroidism, not elsewhere classified: Secondary | ICD-10-CM | POA: Diagnosis not present

## 2024-01-01 DIAGNOSIS — D631 Anemia in chronic kidney disease: Secondary | ICD-10-CM | POA: Diagnosis not present

## 2024-01-03 DIAGNOSIS — E1165 Type 2 diabetes mellitus with hyperglycemia: Secondary | ICD-10-CM | POA: Diagnosis not present

## 2024-01-04 DIAGNOSIS — E1169 Type 2 diabetes mellitus with other specified complication: Secondary | ICD-10-CM | POA: Diagnosis not present

## 2024-01-04 DIAGNOSIS — I10 Essential (primary) hypertension: Secondary | ICD-10-CM | POA: Diagnosis not present

## 2024-01-04 DIAGNOSIS — E559 Vitamin D deficiency, unspecified: Secondary | ICD-10-CM | POA: Diagnosis not present

## 2024-01-04 DIAGNOSIS — E7800 Pure hypercholesterolemia, unspecified: Secondary | ICD-10-CM | POA: Diagnosis not present

## 2024-01-09 DIAGNOSIS — R809 Proteinuria, unspecified: Secondary | ICD-10-CM | POA: Diagnosis not present

## 2024-01-09 DIAGNOSIS — E1129 Type 2 diabetes mellitus with other diabetic kidney complication: Secondary | ICD-10-CM | POA: Diagnosis not present

## 2024-01-09 DIAGNOSIS — N1831 Chronic kidney disease, stage 3a: Secondary | ICD-10-CM | POA: Diagnosis not present

## 2024-01-09 DIAGNOSIS — I129 Hypertensive chronic kidney disease with stage 1 through stage 4 chronic kidney disease, or unspecified chronic kidney disease: Secondary | ICD-10-CM | POA: Diagnosis not present

## 2024-01-11 DIAGNOSIS — I10 Essential (primary) hypertension: Secondary | ICD-10-CM | POA: Diagnosis not present

## 2024-01-11 DIAGNOSIS — E1169 Type 2 diabetes mellitus with other specified complication: Secondary | ICD-10-CM | POA: Diagnosis not present

## 2024-01-14 ENCOUNTER — Other Ambulatory Visit: Payer: Self-pay | Admitting: Nurse Practitioner

## 2024-01-30 ENCOUNTER — Other Ambulatory Visit (HOSPITAL_BASED_OUTPATIENT_CLINIC_OR_DEPARTMENT_OTHER): Payer: Self-pay

## 2024-02-14 DIAGNOSIS — Z89511 Acquired absence of right leg below knee: Secondary | ICD-10-CM | POA: Diagnosis not present

## 2024-02-20 ENCOUNTER — Ambulatory Visit (INDEPENDENT_AMBULATORY_CARE_PROVIDER_SITE_OTHER): Admitting: Podiatry

## 2024-02-20 ENCOUNTER — Encounter: Payer: Self-pay | Admitting: Podiatry

## 2024-02-20 DIAGNOSIS — B351 Tinea unguium: Secondary | ICD-10-CM

## 2024-02-20 DIAGNOSIS — E1142 Type 2 diabetes mellitus with diabetic polyneuropathy: Secondary | ICD-10-CM | POA: Diagnosis not present

## 2024-02-20 DIAGNOSIS — Z89511 Acquired absence of right leg below knee: Secondary | ICD-10-CM

## 2024-02-20 NOTE — Progress Notes (Signed)
  Subjective:  Patient ID: Ricky [name], male    DOB: 10-13-1965,  MRN: [id number]  Ricky [name] presents to clinic today for at risk foot care. Patient has h/o NIDDM, neuropathy with amputation of below knee amputation right lower extremity and painful, discolored, thick toenails which interfere with daily activities  Chief Complaint  Patient presents with   Diabetes    DFC IDDM A1C 8.4. Toenail trim. Right foot AKA.    New problem(s): None.   PCP is Ricky Vaughn FALCON, MD.  No Known Allergies  Review of Systems: Negative except as noted in the HPI.  Objective: No changes noted in today's physical examination. There were no vitals filed for this visit. Ricky [name] is a pleasant 58 y.o. male WD, WN in NAD. AAO x 3.  Normal sensation of 10g monofilament Intact posterior tibialis and dorsalis pedis pulses Vascular Examination: Capillary refill time immediate LLE.  Palpable pedal pulses LLE. Pedal hair present LLE. Skin temperature gradient WNL LLE. No cyanosis or clubbing LLE. No ischemia or gangrene noted. No edema noted LLE.  Neurological Examination: Pt has subjective symptoms of neuropathy. Sensation grossly intact b/l with 10 gram monofilament.   Dermatological Examination: Pedal skin with normal turgor, texture and tone LLE.  No open wounds. No interdigital macerations.   Toenails 1-5 LLE thick, discolored, elongated with subungual debris and pain on dorsal palpation.   Musculoskeletal Examination: Muscle strength 5/5 to all LE muscle groups of left lower extremity. Lower extremity amputation(s): below knee amputation right lower extremity. Utilizes motorized chair for mobility assistance.  Radiographs: None  Assessment/Plan: 1. Onychomycosis   2. Right below-knee amputee (HCC)   3. Diabetic peripheral neuropathy [facility])    Treatment provided by assistant, Andrez Consent given for treatment. Patient examined. All patient's and/or POA's questions/concerns  addressed on today's visit. Toenails 1-5 left foot debrided in length and girth without incident. Continue foot and shoe inspections daily. Monitor blood glucose per PCP/Endocrinologist's recommendations. Continue soft, supportive shoe gear daily. Report any pedal injuries to medical professional. Call office if there are any questions/concerns. Patient/POA to call should there be question/concern in the interim.   Return in about 3 months (around 05/22/2024).  Ricky [name], DPM      [place] LOCATION: [address] [place] [address]                   Office [phone]   [facility] LOCATION: [address] [place], [place] [address] Office [phone]

## 2024-02-26 DIAGNOSIS — E7800 Pure hypercholesterolemia, unspecified: Secondary | ICD-10-CM | POA: Diagnosis not present

## 2024-02-26 DIAGNOSIS — I10 Essential (primary) hypertension: Secondary | ICD-10-CM | POA: Diagnosis not present

## 2024-02-26 DIAGNOSIS — E1169 Type 2 diabetes mellitus with other specified complication: Secondary | ICD-10-CM | POA: Diagnosis not present

## 2024-02-26 DIAGNOSIS — E559 Vitamin D deficiency, unspecified: Secondary | ICD-10-CM | POA: Diagnosis not present

## 2024-02-27 DIAGNOSIS — E113513 Type 2 diabetes mellitus with proliferative diabetic retinopathy with macular edema, bilateral: Secondary | ICD-10-CM | POA: Diagnosis not present

## 2024-03-04 DIAGNOSIS — E1165 Type 2 diabetes mellitus with hyperglycemia: Secondary | ICD-10-CM | POA: Diagnosis not present

## 2024-03-13 DIAGNOSIS — E1169 Type 2 diabetes mellitus with other specified complication: Secondary | ICD-10-CM | POA: Diagnosis not present

## 2024-03-13 DIAGNOSIS — I10 Essential (primary) hypertension: Secondary | ICD-10-CM | POA: Diagnosis not present

## 2024-03-14 DIAGNOSIS — I10 Essential (primary) hypertension: Secondary | ICD-10-CM | POA: Diagnosis not present

## 2024-03-14 DIAGNOSIS — R718 Other abnormality of red blood cells: Secondary | ICD-10-CM | POA: Diagnosis not present

## 2024-03-14 DIAGNOSIS — E1169 Type 2 diabetes mellitus with other specified complication: Secondary | ICD-10-CM | POA: Diagnosis not present

## 2024-03-14 DIAGNOSIS — E162 Hypoglycemia, unspecified: Secondary | ICD-10-CM | POA: Diagnosis not present

## 2024-03-17 ENCOUNTER — Encounter: Payer: Self-pay | Admitting: Nurse Practitioner

## 2024-03-17 ENCOUNTER — Ambulatory Visit (INDEPENDENT_AMBULATORY_CARE_PROVIDER_SITE_OTHER): Admitting: Nurse Practitioner

## 2024-03-17 VITALS — BP 138/78 | HR 97 | Ht 66.0 in | Wt 192.2 lb

## 2024-03-17 DIAGNOSIS — E782 Mixed hyperlipidemia: Secondary | ICD-10-CM | POA: Diagnosis not present

## 2024-03-17 DIAGNOSIS — Z7984 Long term (current) use of oral hypoglycemic drugs: Secondary | ICD-10-CM

## 2024-03-17 DIAGNOSIS — E559 Vitamin D deficiency, unspecified: Secondary | ICD-10-CM

## 2024-03-17 DIAGNOSIS — E119 Type 2 diabetes mellitus without complications: Secondary | ICD-10-CM

## 2024-03-17 DIAGNOSIS — Z794 Long term (current) use of insulin: Secondary | ICD-10-CM | POA: Diagnosis not present

## 2024-03-17 DIAGNOSIS — I10 Essential (primary) hypertension: Secondary | ICD-10-CM

## 2024-03-17 MED ORDER — DAPAGLIFLOZIN PROPANEDIOL 10 MG PO TABS: 10.0000 mg | ORAL_TABLET | Freq: Every day | ORAL | 3 refills | Status: DC

## 2024-03-17 MED ORDER — SURE COMFORT PEN NEEDLES 31G X 5 MM MISC: 3 refills | Status: AC

## 2024-03-17 MED ORDER — LANTUS SOLOSTAR 100 UNIT/ML ~~LOC~~ SOPN: 45.0000 [IU] | PEN_INJECTOR | Freq: Every day | SUBCUTANEOUS | 3 refills | Status: DC

## 2024-03-17 MED ORDER — DEXCOM G7 SENSOR MISC: 1.0000 | 3 refills | Status: AC

## 2024-03-17 MED ORDER — METFORMIN HCL ER 500 MG PO TB24: 500.0000 mg | ORAL_TABLET | Freq: Every day | ORAL | 3 refills | Status: DC
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
## 2024-03-27 DIAGNOSIS — E7800 Pure hypercholesterolemia, unspecified: Secondary | ICD-10-CM | POA: Diagnosis not present

## 2024-03-27 DIAGNOSIS — E559 Vitamin D deficiency, unspecified: Secondary | ICD-10-CM | POA: Diagnosis not present

## 2024-03-27 DIAGNOSIS — I10 Essential (primary) hypertension: Secondary | ICD-10-CM | POA: Diagnosis not present

## 2024-03-27 DIAGNOSIS — E1169 Type 2 diabetes mellitus with other specified complication: Secondary | ICD-10-CM | POA: Diagnosis not present

## 2024-03-28 DIAGNOSIS — E113513 Type 2 diabetes mellitus with proliferative diabetic retinopathy with macular edema, bilateral: Secondary | ICD-10-CM | POA: Diagnosis not present

## 2024-03-28 DIAGNOSIS — Z01818 Encounter for other preprocedural examination: Secondary | ICD-10-CM | POA: Diagnosis not present

## 2024-03-28 DIAGNOSIS — H4312 Vitreous hemorrhage, left eye: Secondary | ICD-10-CM | POA: Diagnosis not present

## 2024-04-04 DIAGNOSIS — E1165 Type 2 diabetes mellitus with hyperglycemia: Secondary | ICD-10-CM | POA: Diagnosis not present

## 2024-04-08 DIAGNOSIS — H4312 Vitreous hemorrhage, left eye: Secondary | ICD-10-CM | POA: Diagnosis not present

## 2024-04-08 DIAGNOSIS — I10 Essential (primary) hypertension: Secondary | ICD-10-CM | POA: Diagnosis not present

## 2024-04-11 DIAGNOSIS — E1169 Type 2 diabetes mellitus with other specified complication: Secondary | ICD-10-CM | POA: Diagnosis not present

## 2024-04-11 DIAGNOSIS — I10 Essential (primary) hypertension: Secondary | ICD-10-CM | POA: Diagnosis not present

## 2024-04-16 DIAGNOSIS — E113513 Type 2 diabetes mellitus with proliferative diabetic retinopathy with macular edema, bilateral: Secondary | ICD-10-CM | POA: Diagnosis not present

## 2024-04-17 DIAGNOSIS — E1169 Type 2 diabetes mellitus with other specified complication: Secondary | ICD-10-CM | POA: Diagnosis not present

## 2024-04-17 DIAGNOSIS — E7800 Pure hypercholesterolemia, unspecified: Secondary | ICD-10-CM | POA: Diagnosis not present

## 2024-04-17 DIAGNOSIS — I10 Essential (primary) hypertension: Secondary | ICD-10-CM | POA: Diagnosis not present

## 2024-04-17 DIAGNOSIS — E559 Vitamin D deficiency, unspecified: Secondary | ICD-10-CM | POA: Diagnosis not present

## 2024-05-01 DIAGNOSIS — E1169 Type 2 diabetes mellitus with other specified complication: Secondary | ICD-10-CM | POA: Diagnosis not present

## 2024-05-01 DIAGNOSIS — E559 Vitamin D deficiency, unspecified: Secondary | ICD-10-CM | POA: Diagnosis not present

## 2024-05-01 DIAGNOSIS — I10 Essential (primary) hypertension: Secondary | ICD-10-CM | POA: Diagnosis not present

## 2024-05-01 DIAGNOSIS — Z23 Encounter for immunization: Secondary | ICD-10-CM | POA: Diagnosis not present

## 2024-05-01 DIAGNOSIS — E7800 Pure hypercholesterolemia, unspecified: Secondary | ICD-10-CM | POA: Diagnosis not present

## 2024-05-05 DIAGNOSIS — E1165 Type 2 diabetes mellitus with hyperglycemia: Secondary | ICD-10-CM | POA: Diagnosis not present

## 2024-05-06 DIAGNOSIS — N189 Chronic kidney disease, unspecified: Secondary | ICD-10-CM | POA: Diagnosis not present

## 2024-05-06 DIAGNOSIS — R809 Proteinuria, unspecified: Secondary | ICD-10-CM | POA: Diagnosis not present

## 2024-05-06 DIAGNOSIS — D631 Anemia in chronic kidney disease: Secondary | ICD-10-CM | POA: Diagnosis not present

## 2024-05-06 DIAGNOSIS — E20818 Other specified hypoparathyroidism due to impaired parathyroid hormone secretion: Secondary | ICD-10-CM | POA: Diagnosis not present

## 2024-05-13 DIAGNOSIS — E1129 Type 2 diabetes mellitus with other diabetic kidney complication: Secondary | ICD-10-CM | POA: Diagnosis not present

## 2024-05-13 DIAGNOSIS — R809 Proteinuria, unspecified: Secondary | ICD-10-CM | POA: Diagnosis not present

## 2024-05-13 DIAGNOSIS — E1169 Type 2 diabetes mellitus with other specified complication: Secondary | ICD-10-CM | POA: Diagnosis not present

## 2024-05-13 DIAGNOSIS — E1122 Type 2 diabetes mellitus with diabetic chronic kidney disease: Secondary | ICD-10-CM | POA: Diagnosis not present

## 2024-05-13 DIAGNOSIS — I10 Essential (primary) hypertension: Secondary | ICD-10-CM | POA: Diagnosis not present

## 2024-05-14 DIAGNOSIS — E113513 Type 2 diabetes mellitus with proliferative diabetic retinopathy with macular edema, bilateral: Secondary | ICD-10-CM | POA: Diagnosis not present

## 2024-05-14 DIAGNOSIS — I10 Essential (primary) hypertension: Secondary | ICD-10-CM | POA: Diagnosis not present

## 2024-05-14 DIAGNOSIS — E7800 Pure hypercholesterolemia, unspecified: Secondary | ICD-10-CM | POA: Diagnosis not present

## 2024-05-14 DIAGNOSIS — E1169 Type 2 diabetes mellitus with other specified complication: Secondary | ICD-10-CM | POA: Diagnosis not present

## 2024-05-14 DIAGNOSIS — I700 Atherosclerosis of aorta: Secondary | ICD-10-CM | POA: Diagnosis not present

## 2024-05-20 DIAGNOSIS — E113511 Type 2 diabetes mellitus with proliferative diabetic retinopathy with macular edema, right eye: Secondary | ICD-10-CM | POA: Diagnosis not present

## 2024-06-04 ENCOUNTER — Ambulatory Visit (INDEPENDENT_AMBULATORY_CARE_PROVIDER_SITE_OTHER): Admitting: Podiatry

## 2024-06-04 ENCOUNTER — Encounter: Payer: Self-pay | Admitting: Podiatry

## 2024-06-04 DIAGNOSIS — Z89511 Acquired absence of right leg below knee: Secondary | ICD-10-CM

## 2024-06-04 DIAGNOSIS — E1165 Type 2 diabetes mellitus with hyperglycemia: Secondary | ICD-10-CM | POA: Diagnosis not present

## 2024-06-04 DIAGNOSIS — B351 Tinea unguium: Secondary | ICD-10-CM

## 2024-06-04 DIAGNOSIS — B353 Tinea pedis: Secondary | ICD-10-CM

## 2024-06-04 DIAGNOSIS — E1142 Type 2 diabetes mellitus with diabetic polyneuropathy: Secondary | ICD-10-CM | POA: Diagnosis not present

## 2024-06-04 MED ORDER — KETOCONAZOLE 2 % EX CREA: TOPICAL_CREAM | CUTANEOUS | 1 refills | Status: AC

## 2024-06-04 NOTE — Patient Instructions (Signed)
 To prevent reinfection, spray shoes with lysol every evening.  Clean tub or shower with bleach based cleanser.  Athlete's Foot Athlete's foot (tinea pedis) is a fungal infection of the skin on your feet. It often occurs on the skin that is between or underneath the toes. It can also occur on the soles of your feet. The infection can spread from person to person (is contagious). It can also spread when a person's bare feet come in contact with the fungus on shower floors or on items such as shoes. What are the causes? This condition is caused by a fungus that grows in warm, moist places. You can get athlete's foot by sharing shoes, shower stalls, towels, and wet floors with someone who is infected. Not washing your feet or changing your socks often enough can also lead to athlete's foot. What increases the risk? This condition is more likely to develop in: Men. People who have a weak body defense system (immune system). People who have diabetes. People who use public showers, such as at a gym. People who wear heavy-duty shoes, such as industrial or military shoes. Seasons with warm, humid weather. What are the signs or symptoms? Symptoms of this condition include: Itchy areas between your toes or on the soles of your feet. White, flaky, or scaly areas between your toes or on the soles of your feet. Very itchy small blisters between your toes or on the soles of your feet. Small cuts in your skin. These cuts can become infected. Thick or discolored toenails. How is this diagnosed? This condition may be diagnosed with a physical exam and a review of your medical history. Your health care provider may also take a skin or toenail sample to examine under a microscope. How is this treated? This condition is treated with antifungal medicines. These may be applied as powders, ointments, or creams. In severe cases, an oral antifungal medicine may be given. Follow these instructions at  home: Medicines Apply or take over-the-counter and prescription medicines only as told by your health care provider. Apply your antifungal medicine as told by your health care provider. Do not stop using the antifungal even if your condition improves. Foot care Do not scratch your feet. Keep your feet dry: Wear cotton or wool socks. Change your socks every day or if they become wet. Wear shoes that allow air to flow, such as sandals or canvas tennis shoes. Wash and dry your feet, including the area between your toes. Also, wash and dry your feet: Every day or as told by your health care provider. After exercising. General instructions Do not let others use towels, shoes, nail clippers, or other personal items that touch your feet. Protect your feet by wearing sandals in wet areas, such as locker rooms and shared showers. Keep all follow-up visits. This is important. If you have diabetes, keep your blood sugar under control. Contact a health care provider if: You have a fever. You have swelling, soreness, warmth, or redness in your foot. Your feet are not getting better with treatment. Your symptoms get worse. You have new symptoms. You have severe pain. Summary Athlete's foot (tinea pedis) is a fungal infection of the skin on your feet. It often occurs on skin that is between or underneath the toes. This condition is caused by a fungus that grows in warm, moist places. Symptoms include white, flaky, or scaly areas between your toes or on the soles of your feet. This condition is treated with antifungal medicines.   Keep your feet clean. Always dry them thoroughly. This information is not intended to replace advice given to you by your health care provider. Make sure you discuss any questions you have with your health care provider. Document Revised: 11/21/2020 Document Reviewed: 11/21/2020 Elsevier Patient Education  2024 [employer]  

## 2024-06-09 NOTE — Progress Notes (Signed)
  Subjective:  Patient ID: Ricky [name], male    DOB: [birth year],  MRN: [id number]  Ricky [name] presents to clinic today for at risk foot care. Patient has h/o NIDDM, neuropathy with amputation of below knee amputation right lower extremity and thick, elongated toenails left foot which are tender when wearing enclosed shoe gear.  Chief Complaint  Patient presents with   Diabetes    DFC IDDM A1C 8.4. Tenail trim. LOV with PCP 12/06/23.   New problem(s): None.   PCP is Ricky Vaughn FALCON, Ricky [name].  No Known Allergies  Review of Systems: Negative except as noted in the HPI.  Objective:  There were no vitals filed for this visit. Ricky [name] is a pleasant 58 y.o. male in NAD. AAO x 3.  Vascular Examination: Capillary refill time immediate LLE.  Palpable pedal pulses LLE. Pedal hair present LLE. Skin temperature gradient WNL LLE. No cyanosis or clubbing LLE. No ischemia or gangrene noted. No edema noted LLE.  Neurological Examination: Pt has subjective symptoms of neuropathy. Sensation grossly intact b/l with 10 gram monofilament.   Dermatological Examination: Diffuse scaling noted peripherally and plantarly b/l feet.  No interdigital macerations.  No blisters, no weeping. No signs of secondary bacterial infection noted.  Pedal skin with normal turgor and tone LLE.  No open wounds. No interdigital macerations.   Toenails 1-5 LLE thick, discolored, elongated with subungual debris and pain on dorsal palpation.   Musculoskeletal Examination: Muscle strength 5/5 to all LE muscle groups of left lower extremity. Lower extremity amputation(s): below knee amputation right lower extremity. Utilizes motorized chair for mobility assistance.  Radiographs: None  Assessment/Plan: 1. Onychomycosis   2. Tinea pedis of left foot   3. Right below-knee amputee (HCC)   4. Diabetic peripheral neuropathy (HCC)     Meds ordered this encounter  Medications   ketoconazole  (NIZORAL ) 2 %  cream    Sig: Apply to left foot and between toes once daily for 6 weeks.    Dispense:  60 g    Refill:  1  Patient was evaluated and treated. All patient's and/or POA's questions/concerns addressed on today's visit. Toenails 1-5 left foot debrided in length and girth without incident. Treatment was provided by assistant Ricky [name] under my supervision. Continue soft, supportive shoe gear daily. Report any pedal injuries to medical professional. Call office if there are any questions/concerns. -Discussed tinea pedis infection. To prevent re-infection of tinea pedis, patient/POA/caregiver instructed to spray shoes with Lysol every evening and clean tub/shower with bleach based cleanser. Rx sent to pharmacy for Ketoconazole  Cream 2% to be applied to both feet and between toes once daily for six week.  -Patient/POA to call should there be question/concern in the interim.   Return in about 3 months (around 09/04/2024).  Ricky [name], DPM      [place] LOCATION: [address] [place] [address]                   Office [phone]   [facility] LOCATION: [address] [place], [place] [address] Office [phone]

## 2024-06-19 ENCOUNTER — Ambulatory Visit (INDEPENDENT_AMBULATORY_CARE_PROVIDER_SITE_OTHER): Admitting: Nurse Practitioner

## 2024-06-19 ENCOUNTER — Encounter: Payer: Self-pay | Admitting: Nurse Practitioner

## 2024-06-19 VITALS — BP 112/64 | HR 88 | Ht 66.0 in | Wt 184.0 lb

## 2024-06-19 DIAGNOSIS — Z7984 Long term (current) use of oral hypoglycemic drugs: Secondary | ICD-10-CM

## 2024-06-19 DIAGNOSIS — E782 Mixed hyperlipidemia: Secondary | ICD-10-CM

## 2024-06-19 DIAGNOSIS — E559 Vitamin D deficiency, unspecified: Secondary | ICD-10-CM | POA: Diagnosis not present

## 2024-06-19 DIAGNOSIS — Z794 Long term (current) use of insulin: Secondary | ICD-10-CM | POA: Diagnosis not present

## 2024-06-19 DIAGNOSIS — I10 Essential (primary) hypertension: Secondary | ICD-10-CM

## 2024-06-19 DIAGNOSIS — E119 Type 2 diabetes mellitus without complications: Secondary | ICD-10-CM | POA: Diagnosis not present

## 2024-06-19 LAB — POCT GLYCOSYLATED HEMOGLOBIN (HGB A1C): Hemoglobin A1C: 8 % — AB (ref 4.0–5.6)

## 2024-06-19 MED ORDER — DAPAGLIFLOZIN PROPANEDIOL 10 MG PO TABS: 10.0000 mg | ORAL_TABLET | Freq: Every day | ORAL | 3 refills | Status: AC

## 2024-06-19 MED ORDER — METFORMIN HCL ER 500 MG PO TB24: 500.0000 mg | ORAL_TABLET | Freq: Every day | ORAL | 3 refills | Status: AC

## 2024-06-19 MED ORDER — MOUNJARO 5 MG/0.5ML ~~LOC~~ SOAJ: 5.0000 mg | SUBCUTANEOUS | 1 refills | Status: AC

## 2024-06-19 MED ORDER — LANTUS SOLOSTAR 100 UNIT/ML ~~LOC~~ SOPN: 25.0000 [IU] | PEN_INJECTOR | Freq: Every day | SUBCUTANEOUS | 3 refills | Status: AC

## 2024-06-19 NOTE — Addendum Note (Signed)
 Addended by: KRYSTAL MADELIN HERO on: 06/19/2024 10:03 AM   Modules accepted: Orders
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
## 2024-06-23 NOTE — Progress Notes (Signed)
 Lilburn Straw                                          MRN: [id number]   06/23/2024   The VBCI Quality Team Specialist reviewed this patient medical record for the purposes of chart review for care gap closure. The following were reviewed: chart review for care gap closure-kidney health evaluation for diabetes:eGFR  and uACR.    VBCI Quality Team

## 2024-08-11 ENCOUNTER — Telehealth: Payer: Self-pay | Admitting: *Deleted

## 2024-08-11 NOTE — Telephone Encounter (Signed)
 Need several days worth of readings to assess if we need to make any adjustments to meds at this time.  Glucose of 130 is pretty good.  It may be we will be able to reduce his insulin  dose some too.

## 2024-08-11 NOTE — Telephone Encounter (Signed)
 MRN # [id number] sent a message through the access Nurse. He is wanting to increase his Mounjaro  to 7.5 mg weekly. Currently on 5 mg. this message also shared that the patient Blood? Sugar was 130.  [facility] to [facility] to address.

## 2024-08-13 NOTE — Telephone Encounter (Signed)
 Patient was called and a message was left.  Patient was given Ricky [name]'s recommendation. I tried to download his CGM data but the last dated that I could see was October 24,2025 - 06-19-2024. I ask that patient to come by the office first thing on Friday, 08/15/24 and we could just review his readings on his meter.

## 2024-09-16 ENCOUNTER — Ambulatory Visit: Admitting: Podiatry

## 2024-09-16 ENCOUNTER — Encounter: Payer: Self-pay | Admitting: Podiatry

## 2024-09-16 DIAGNOSIS — Z89511 Acquired absence of right leg below knee: Secondary | ICD-10-CM | POA: Diagnosis not present

## 2024-09-16 DIAGNOSIS — B351 Tinea unguium: Secondary | ICD-10-CM

## 2024-09-16 DIAGNOSIS — E1142 Type 2 diabetes mellitus with diabetic polyneuropathy: Secondary | ICD-10-CM | POA: Diagnosis not present

## 2024-09-16 NOTE — Progress Notes (Signed)
"  °  Subjective:  Patient ID: Ricky [name], male    DOB: Jan 01, 1966,  MRN: [id number]  Ricky [name] presents to clinic today for at risk foot care. Patient has h/o NIDDM, neuropathy with amputation of below knee amputation right lower extremity. He is seen for mycotic toenails of left foot. He notes not new problems on today's visit.  Chief Complaint  Patient presents with   Diabetes    DFC IDDM AiC 8.0. Left foot toienail trim. LOV with PCP 08/2024.   New problem(s): None.   PCP is Trudy Vaughn FALCON, MD.  Allergies[1]  Review of Systems: Negative except as noted in the HPI.  Objective: No changes noted in today's physical examination. There were no vitals filed for this visit. Ricky [name] is a pleasant 59 y.o. male WD, WN in NAD. AAO x 3.  Vascular Examination: Capillary refill time immediate LLE.  Palpable pedal pulses LLE. Pedal hair present LLE. Skin temperature gradient WNL LLE. No cyanosis or clubbing LLE. No ischemia or gangrene noted. No edema noted LLE.  Neurological Examination: Pt has subjective symptoms of neuropathy. Sensation grossly intact b/l with 10 gram monofilament.   Dermatological Examination: Diffuse scaling noted peripherally and plantarly b/l feet.  No interdigital macerations.  No blisters, no weeping. No signs of secondary bacterial infection noted.  Pedal skin with normal turgor and tone LLE.  No open wounds. No interdigital macerations.   Toenails 1-5 LLE thick, discolored, elongated with subungual debris and pain on dorsal palpation.   Musculoskeletal Examination: Muscle strength 5/5 to all LE muscle groups of left lower extremity. Lower extremity amputation(s): below knee amputation right lower extremity. Utilizes motorized chair for mobility assistance.  Radiographs: None  Assessment/Plan: 1. Onychomycosis   2. Right below-knee amputee (HCC)   3. Diabetic peripheral neuropathy [facility])     Consent given for treatment. Patient  examined. All patient's and/or POA's questions/concerns addressed on today's visit. Mycotic toenails 1-5 left foot debrided in length and girth without incident. Continue foot and shoe inspections daily. Monitor blood glucose per PCP/Endocrinologist's recommendations.Continue soft, supportive shoe gear daily. Report any pedal injuries to medical professional. Call office if there are any quesitons/concerns.  Return in about 3 months (around 12/14/2024).  Ricky [name], DPM      [place] LOCATION: [address], [place] [address]                   Office [phone]   [facility] LOCATION: [address] [place], [place] [address] Office [phone]      [1] No Known Allergies  "

## 2024-09-25 ENCOUNTER — Ambulatory Visit: Admitting: Nurse Practitioner

## 2024-12-15 ENCOUNTER — Ambulatory Visit: Admitting: Podiatry
